# Patient Record
Sex: Female | Born: 1955
Health system: Southern US, Community
[De-identification: ages and names within clinical notes are randomized; demographics above are authoritative.]

## PROBLEM LIST (undated history)

## (undated) DIAGNOSIS — S83281A Other tear of lateral meniscus, current injury, right knee, initial encounter: Secondary | ICD-10-CM

## (undated) DIAGNOSIS — N200 Calculus of kidney: Secondary | ICD-10-CM

## (undated) DIAGNOSIS — F329 Major depressive disorder, single episode, unspecified: Secondary | ICD-10-CM

## (undated) DIAGNOSIS — F32A Depression, unspecified: Secondary | ICD-10-CM

## (undated) DIAGNOSIS — E079 Disorder of thyroid, unspecified: Secondary | ICD-10-CM

## (undated) DIAGNOSIS — Z720 Tobacco use: Secondary | ICD-10-CM

## (undated) DIAGNOSIS — E785 Hyperlipidemia, unspecified: Secondary | ICD-10-CM

## (undated) DIAGNOSIS — I10 Essential (primary) hypertension: Secondary | ICD-10-CM

## (undated) DIAGNOSIS — I2510 Atherosclerotic heart disease of native coronary artery without angina pectoris: Secondary | ICD-10-CM

## (undated) DIAGNOSIS — F419 Anxiety disorder, unspecified: Secondary | ICD-10-CM

## (undated) HISTORY — DX: Atherosclerotic heart disease of native coronary artery without angina pectoris: I25.10

## (undated) HISTORY — PX: APPENDECTOMY: SHX54

## (undated) HISTORY — DX: Disorder of thyroid, unspecified: E07.9

## (undated) HISTORY — DX: Tobacco use: Z72.0

## (undated) HISTORY — DX: Major depressive disorder, single episode, unspecified: F32.9

## (undated) HISTORY — DX: Hyperlipidemia, unspecified: E78.5

## (undated) HISTORY — PX: TIBIA FRACTURE SURGERY: SHX806

## (undated) HISTORY — DX: Anxiety disorder, unspecified: F41.9

## (undated) HISTORY — DX: Depression, unspecified: F32.A

## (undated) HISTORY — PX: HX HEART CATHETERIZATION: SHX148

## (undated) HISTORY — PX: LEG SURGERY: SHX1003

## (undated) HISTORY — PX: HX APPENDECTOMY: SHX54

## (undated) HISTORY — PX: CORONARY ARTERY ANGIOPLASTY: PR CATH30428

---

## 2009-01-02 NOTE — ED Provider Notes (Signed)
[facility] ED    HPI  -----------------------------------------HISTORY OF PRESENT ILLNESS----------------------------------------  01/02/2009, 3:12 PM   Cassandra Shah is a 53 y.o. female presenting to the ED for headache for 2 weeks. Last night headache worsened. Patient took Fioricet and Vicodin but vomited x2 times. Pain is around her neck and head, report it is similar to previous migraines.  Patient went to Urgent Care this morning, was directed to come to the ER if her headache got worse. Patient has a hx of hypertension. The patient denies any other complaints or bodily discomforts, and is otherwise in their normal state of health.          Review of Systems   Constitutional: Negative for fever and chills.   HENT: Negative for congestion and rhinorrhea.    Eyes: Positive for photophobia. Negative for visual disturbance.   Gastrointestinal: Positive for nausea and vomiting.   Skin: Negative for rash and wound.   Neurological: Positive for headaches. Negative for weakness, light-headedness and numbness.       Past Medical History   Diagnosis Date   . Hypertension    . Migraine    . Kidney Stones          No past surgical history on file.  No family history on file.  History   Social History   . Marital Status: Single     Spouse Name: N/A     Number of Children: N/A   . Years of Education: N/A   Occupational History   . Not on file.   Social History Main Topics   . Tobacco Use: Never   . Alcohol Use: Yes      occasiona   . Drug Use: No   . Sexually Active: Not on file   Other Topics Concern   . Not on file   Social History Narrative   . No narrative on file         Outpatient prescriptions marked as taking for the 01/02/09 encounter [facility] Encounter)   Medication Sig Dispense Refill   . AMLODIPINE BESYLATE (NORVASC PO) Take  by Mouth.       Marland Kitchen BUTALB/ACETAMINOPHEN/CAFFEINE (FIORICET PO) Take  by Mouth.       . hydrocodone-acetaminophen (VICODIN) 5-500 mg PO TABS Take 1 Tab by Mouth Every 6 Hours As Needed as needed  for pain or headache   30  0         Allergies   Allergen Reactions   . Aspirin hives           Vital Signs:  Patient Vital Signs in the past 72 hrs:   Temp Heart Rate Resp BP BP Mean SpO2 Weight   01/02/09 1330 96.8 F (36 C) 99  24  159/98 mmHg 118 MM HG 99 % 86.183 kg (190 lb)         Physical Exam  ----------------------------------------------------PHYSICAL EXAM----------------------------------------------------    CONSTITUTIONAL:  Well developed, well nourished.  Awake & alert.  No acute distress, non-toxic in appearance, not diaphoretic.  Afebrile.    EYES: Pupils are equal, round, and reactive.  Extra-ocular muscles are intact.  Sclera are anicteric.     HENT:   AT/NC. Moist mucosa.  Posterior pharynx is clear, no exudates. The left TM is unremarkable.  The right TM is unremarkable.      NECK:  Non-tender C-spine.  No lymphadenopathy.  Neck is supple without meningismus.    CARDIOVASCULAR: Regular rate and rhythm, no murmurs, rubs, or gallops.  Peripheral  pulses are 2+ and equal.     PULMONARY/CHEST:  Clear to auscultation bilaterally. No wheezing, rales or rhonchi.    ABDOMINAL: Soft and non-distended.  No tenderness. No rebound, guarding, or rigidity.  Overall benign exam.     MUSCULOSKELETAL: Back is non-tender.  Full ROM in all extremities.  No peripheral edema.    SKIN:  Normal color.  No petechiae or purpura.      NEUROLOGICAL: Alert, awake, and appropriate. Cranial nerves II-XII are intact.  Motor strength is equal and 5/5 in the upper and lower extremities bilaterally.  No facial asymmetry.  Normal speech.  No acute focal neurological deficits are appreciated.    -----------------------------------------------------DIAGNOSTICS------------------------------------------------------    LABORATORY:   No results found for this visit on 01/02/09.        ED Objective Order Summary:   Orders Placed This Encounter   Procedure   . Ekg 12 lead unit performed            -------------------------------------------ED COURSE -AND- MEDICAL DECISION MAKING--------------------------------------    Documentation Review:   The available past medical records from previous ED visits, hospital admissions, or clinic visits have been reviewed.  The available information has contributed to PMSHx and the context for this visit.     The nursing notes for this patient's current visit have been reviewed with the vital signs, PMSFSHx, and initial presenting complaint incorporated into this note.     ED Medications   Medication Dose Route Frequency Provider   . NS 0.9 % injection 10 mL  10 mL Intravenous PRN Coral Ceo, MD   . sodium chloride (NORMAL SALINE) 0.9% infusion   1,000 mL Intravenous Once Coral Ceo, MD   . HYDROmorPHONE (DILAUDID) injection 1 mg  1 mg Intravenous Once Coral Ceo, MD   . promethazine (PHENERGAN) injection 12.5 mg  12.5 mg Intravenous Once Coral Ceo, MD             Physician's Notes:   6:05 PM pt ok with plan.  Feels better.  No FND.  To f/u with pmd.    The plan for treatment has been reviewed at bedside.  Pt agrees with the plan as discussed and her questions are answered at this time; the plan is stated as understood.      FINAL DIAGNOSIS:  1. Migraine Headache (346.90D)          --------------------------------------------------DISPOSITION-------------------------------------------------    Destination: Home      Discharge Rx:   New Prescriptions    OXYCODONE-ACETAMINOPHEN (PERCOCET) 5-325 MG PO TABS    Take 1 Tab by Mouth Every 6 Hours As Needed for Pain. Do not exceed 4000 mg of acetaminophen per day.    PROMETHAZINE (PHENERGAN) 25 MG PO TABS    Take 0.5 Tabs by Mouth Every 6 Hours As Needed for Nausea.             Scribe Note:  Documentation by Sharion Balloon Copsey acting as scribe for Coral Ceo, MD.    Coral Ceo, MD: This documentation accurately reflects all work, treatment, procedures, and medical decision making performed by me.

## 2009-05-30 NOTE — Procedures (Signed)
Procedures signed by  at 06/24/09  2103                 Author: Dorna Leitz, MD  Service: --  Author Type: Physician            Filed:   Date of Service: 05/30/09 0905  Status: Signed          <!--EPICS-->                      [facility]<BR>                 [address] [place], [place]  [address]<BR> <BR>                           SLEEP MEDICINE EVALUATION<BR> <BR> PATIENT:    Cassandra Shah, Cassandra Shah<BR> MRN:            [id number]     ADMITTED:      05/29/2009<BR> BILLING:        [id number]    CONSULTED:     05/29/2009<BR> ATTENDING:  Randon Goldsmith Reia Viernes, MD<BR> CONSULTING:  Mahealani Sulak D Yarithza Mink, MD<BR> <BR> <BR> The patient is self referred. She thinks that she has obstructive sleep<BR> apnea. She has excessive day-time sleepiness and loud snoring at night. She<BR> has also had a significant number of morning headaches, mouth  breathing.<BR> She also has problems with her hands falling asleep at night. She tosses<BR> and turns. Her Epworth Sleepiness Score is 17/24. She usually sleeps on her<BR> right side and feels that her symptoms are worsening. She may have an iced<BR>  tea before noon, but does not have any caffeine. She does not nap during<BR> the day. She goes to bed around 8:30 and gets up around 5 a.m. She has<BR> difficulty falling asleep and frequently takes [place]. She may have to get<BR> up 4 times throughout  the night to urinate. She does have a lot of problems<BR> with jaw clenching.<BR> <BR> PAST MEDICAL HISTORY:  Significant for skin cancer, hypertension,<BR> hyperlipidemia. She also has had long-term problems with sleeping and is on<BR> Lunesta 2 mg a  night.<BR> <BR> PAST SURGICAL HISTORY:  Status post hysterectomy at age 36. Cholecystectomy<BR> and appendectomy.<BR> <BR> MEDICATIONS<BR> 1. Bystolic.<BR> 2. Fioricet.<BR> 3. Lunesta.<BR> 4. 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 She may have an<BR>  alcoholic beverage every few months, but does not smoke.<BR> <BR> REVIEW OF SYSTEMS:  Significant for positional hand numbness and tingling<BR> at night. Blurring of her vision, ringing in  the ears, shortness of breath<BR> and frequent urination.<BR> <BR> PHYSICAL EXAMINATION<BR> VITAL SIGNS:  Blood pressure is 160/103, pulse 80, respirations 20. She is<BR> 5 feet 3 inches and weighs 208.6 pounds.<BR> HEENT:  Face is symmetric. Speech is  normal. Station, gait and tone are<BR> normal. Nares are patent without masses. Neck is supple without<BR> lymphadenopathy, thyromegaly, jugular venous distention, or carotid bruits.<BR> Neck circumference 14-1/2 inches. Oropharynx, Mallampati 1. Tonsils  and<BR> uvula 1+. Tongue 2+. Dentition is orthognathic.<BR> EXTREMITIES:  Shows no clubbing, cyanosis or edema. Peripheral pulses are<BR> adequate.<BR> <BR> IMPRESSION:  What sounds like significant obstructive sleep apnea. If she<BR> were to do the MSLT,  she would have to go off the [place] as well as the<BR> Fioricet for at least 2 weeks. It is difficult for her to fall asleep and<BR> typically the multiple sleep latency test would be to  look for narcolepsy,<BR> which of course would be associated with  falling asleep too easily.<BR> Therefore I do not think narcolepsy is an issue here and multiple sleep<BR> latency test will not be done. Because she uses Lunesta routinely, I do not<BR> think driving restriction will apply the day after the sleep study.  I will<BR> see her back in follow up and keep you updated as to her progress.<BR> <BR> <BR> <BR> <BR> <BR> <BR> <BR> <BR> <BR> <BR>       Electronically Signed<BR>       Dorna Leitz, MD 06/24/2009 21:03<BR>                                        Koltin Wehmeyer  D Torre Pikus, MD<BR> <BR> ADR:WMX<BR> D: 05/29/2009  8:47 P T: 05/30/2009  9:05 A<BR> Job #:  [id number]  CScriptDoc #:  [id number]<BR> cc:    ELIZABETH A COOPER, MD<BR>        Keenen Roessner D Tonyetta Berko, MD<BR> <!--EPICE-->

## 2009-05-30 NOTE — Procedures (Signed)
[facility]   [address], [place], [place] [address]     SLEEP MEDICINE EVALUATION    PATIENT: Cassandra Shah, Cassandra Shah  MRN: [id number] ADMITTED: 05/29/2009  BILLING: [id number] CONSULTED: 05/29/2009  ATTENDING: Dorna Leitz, MD  CONSULTING: Dorna Leitz, MD      The patient is self referred. She thinks that she has obstructive sleep  apnea. She has excessive day-time sleepiness and loud snoring at night. She  has also had a significant number of morning headaches, mouth breathing.  She also has problems with her hands falling asleep at night. She tosses  and turns. Her Epworth Sleepiness Score is 17/24. She usually sleeps on her  right side and feels that her symptoms are worsening. She may have an iced  tea before noon, but does not have any caffeine. She does not nap during  the day. She goes to bed around 8:30 and gets up around 5 a.m. She has  difficulty falling asleep and frequently takes [place]. She may have to get  up 4 times throughout the night to urinate. She does have a lot of problems  with jaw clenching.    PAST MEDICAL HISTORY: Significant for skin cancer, hypertension,  hyperlipidemia. She also has had long-term problems with sleeping and is on  Lunesta 2 mg a night.    PAST SURGICAL HISTORY: Status post hysterectomy at age 89. Cholecystectomy  and appendectomy.    MEDICATIONS  1. Bystolic.  2. Fioricet.  3. Lunesta.  4. Over-the-counter sleeping pills at times.    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 She may have an  alcoholic beverage every few months, but does not smoke.    REVIEW OF SYSTEMS: Significant for positional hand numbness and tingling  at night. Blurring of her vision, ringing in the ears, shortness of breath  and frequent urination.    PHYSICAL EXAMINATION  VITAL SIGNS: Blood pressure is 160/103, pulse 80, respirations 20. She is  5 feet 3 inches and weighs 208.6 pounds.   HEENT: Face is symmetric. Speech is normal. Station, gait and tone are  normal. Nares are patent without masses. Neck is supple without  lymphadenopathy, thyromegaly, jugular venous distention, or carotid bruits.  Neck circumference 14-1/2 inches. Oropharynx, Mallampati 1. Tonsils and  uvula 1+. Tongue 2+. Dentition is orthognathic.  EXTREMITIES: Shows no clubbing, cyanosis or edema. Peripheral pulses are  adequate.    IMPRESSION: What sounds like significant obstructive sleep apnea. If she  were to do the MSLT, she would have to go off the [place] as well as the  Fioricet for at least 2 weeks. It is difficult for her to fall asleep and  typically the multiple sleep latency test would be to look for narcolepsy,  which of course would be associated with falling asleep too easily.  Therefore I do not think narcolepsy is an issue here and multiple sleep  latency test will not be done. Because she uses Lunesta routinely, I do not  think driving restriction will apply the day after the sleep study. I will  see her back in follow up and keep you updated as to her progress.                       Electronically Signed   Dorna Leitz, MD 06/24/2009 21:03   Dorna Leitz, MD    ADR:WMX  D: 05/29/2009 8:47 P T: 05/30/2009 9:05 A  Job #: [id number]  CScriptDoc #: [id number]  cc: Cherrie Distance, MD   Dorna Leitz, MD

## 2009-06-04 NOTE — Procedures (Unsigned)
[facility]   [address], [place], [place] [address]   SLEEP DISORDER CENTER   POLYSOMNOGRAM    PATIENT: Cassandra Shah, Cassandra Shah  MRN: [id number] DATE: 05/29/2009  BILLING: [id number] LOCATION:  INTERPRETING: Margarito Courser, MD  REFERRING: Dorna Leitz, MD      REFERRING PHYSICIAN: Pricilla Larsson, MD    STUDY NUMBER: [id number]    HISTORY: This polysomnogram was performed for evaluation of  hypersomnolence and snoring, using the American Academy of Sleep Medicine  PSG referential montage. This was a frontal, central, and occipital  monopolar EEG recording, and included EOG, EMG, EKG, nasal/oral airflow,  oxygen saturation, thoracic and abdominal effort, body position, and  snoring monitoring.    Medications taken the day of the study included:      1. Bystolic.  2. Fioricet.  3. Lunesta.    Sedation was taken for the study. The patient had 7 hours of sleep the  night before the study. Respiratory rate ranged 12 to 18 breaths per  minute. No supplemental oxygen was used. Two bathroom trips were taken.  Moderate snoring was noted about 80% of the night, especially in the supine  position.    SCORING: Lights out at 9:46 p.m., lights on at 5:15 a.m. Total recording  time was 449 minutes, total sleep time was 423.5 minutes, for a sleep  efficiency index of 94%. Sleep onset latency was 6 minutes, REM sleep  latency was 114.5 minutes. Wake after sleep onset was 38 minutes.    Sleep stages included awake, stages 1, 2, 3, and REM. Total time awake was  38 minutes. Total time in stage 1 was 36 minutes (9% of total sleep time).  Total time in stage 2 was 225 minutes (63% of total sleep time). Total time  in stage 3 was 90 minutes (21% of total sleep time). Total time in REM was  72.5 minutes (17% of total sleep time). The respiratory event count was 188  with an AHI of 27 overall. The mean duration was 20 seconds, with the  longest duration being 67 seconds. The RAM index was 77, with a supine   index being 29. Minimum oxygen saturation was 81%, with an average of 92%  and 20 minutes were spent below a saturation of 89%.    PERIODIC LEG MOVEMENTS WITH AROUSALS: Zero.    PERIODIC LEG MOVEMENTS: 1.    AVERAGE HEART RATE: 72 beats per minute.    IMPRESSION: Severe obstructive sleep apnea. Alpha intrusion into slow wave  sleep was noted, but no other EEG or EKG abnormalities were noted. No  unusual behaviors were observed during the study. A second-night study for  CPAP titration is recommended and this will be arranged.                       Preliminary   Margarito Courser, MD    MB:wmx  D: 06/03/2009 3:21 P T: 06/04/2009 1:48 A  Job #: [id number] CScriptDoc #: [id number]  cc: Margarito Courser, MD   Cherrie Distance, MD   Dorna Leitz, MD

## 2009-06-25 NOTE — Procedures (Signed)
[facility]   [address] [place], [place] [address]   SLEEP DISORDER CENTER   POLYSOMNOGRAM    PATIENT: Cassandra Shah, Cassandra Shah  MRN: [id number] DATE: 06/05/2009  BILLING: [id number] LOCATION:  INTERPRETING: Dorna Leitz, MD  REFERRING: Dorna Leitz, MD      Study Number is [id number]    This is a titration night.    FINDINGS: The initial sleep onset latency was 16.5 minutes. The patient  slept a total of 6.7 hours. The respiratory rate was 16 to 18 breaths per  minute. Blood pressure was 168/99 before the study and 127/87 after the  study. Mouth breathing was frequently noted. Supplemental oxygen was not  used. Sedation was self administered. The previous night, the patient had 4  hours of sleep. Sleep hygiene violations were not reported. Sleep was  interrupted by one bathroom trip. No alpha intrusion or EEG abnormalities  were seen.    Parasomnias were not observed. Medications included Fioricet and Bystolic.  Cardiac tracings showed normal sinus rhythm. Respiratory events resolved  with PAP titration. The RDI was 1. No snoring was noted. No significant  oxygen desaturation was present. The patient spent only one minute with a  saturation below 89%. Periodic limb movements were not significant. The PLM  index was 1.    At a pressure of 14 CWP, the patient did well, with an RDI of 0.9. This  included supine REM.    RECOMMENDATIONS: PAP will be initiated at a pressure of 15 CWP. The best  mask was the small Quattro mask. Results and instructions regarding  treatment have been sent by mail and I will see the patient in followup.                     Electronically Signed   Dorna Leitz, MD 06/25/2009 19:11   Dorna Leitz, MD    ADR:WMX  D: 06/24/2009 9:49 P T: 06/25/2009 4:44 A  Job #: [id number] CScriptDoc #: [id number]  cc: Cherrie Distance, MD   Dorna Leitz, MD

## 2009-06-25 NOTE — Procedures (Signed)
Procedures signed by  at 06/25/09  1911                 Author: Dorna Leitz, MD  Service: --  Author Type: Physician            Filed:   Date of Service: 06/25/09 0444  Status: Signed            Procedure Orders        1. POLYSOMNOGRAPHY 1 NIGHT [[id number]] ordered by  at 06/25/09 0444                         <!--EPICS-->                      [facility]<BR>                 [address] [place], [place]  [address]<BR>                             SLEEP DISORDER CENTER<BR>                                POLYSOMNOGRAM<BR> <BR> PATIENT:      Cassandra Shah, Cassandra Shah<BR> MRN:              [id number]    DATE:       06/05/2009<BR> BILLING:          [id number]   LOCATION:<BR> INTERPRETING:  Randon Goldsmith Sumner Boesch, MD<BR> REFERRING:    Randon Goldsmith Jawann Urbani, MD<BR> <BR> <BR> Study Number is [id number]<BR> <BR> This is a titration night.<BR> <BR> FINDINGS:  The initial sleep onset latency was 16.5 minutes. The patient<BR> slept a total of 6.7 hours. The respiratory  rate was 16 to 18 breaths per<BR> minute. Blood pressure was 168/99 before the study and 127/87 after the<BR> study. Mouth breathing was frequently noted. Supplemental oxygen was not<BR> used. Sedation was self administered. The previous night, the patient  had 4<BR> hours of sleep. Sleep hygiene violations were not reported. Sleep was<BR> interrupted by one bathroom trip. No alpha intrusion or EEG abnormalities<BR> were seen.<BR> <BR> Parasomnias were not observed. Medications included Fioricet and Bystolic.<BR>  Cardiac tracings showed normal sinus rhythm. Respiratory events resolved<BR> with PAP titration. The RDI was 1. No snoring was noted. No significant<BR> oxygen desaturation was present. The patient spent only one minute with a<BR> saturation below 89%.  Periodic limb movements were not significant. The PLM<BR> index was 1.<BR> <BR> At a pressure of 14 CWP, the patient did well, with an RDI of 0.9. This<BR> included supine REM.<BR> <BR>  RECOMMENDATIONS:  PAP will be initiated at a pressure of 15 CWP.  The best<BR> mask was the small Quattro mask. Results and instructions regarding<BR> treatment have been sent by mail and I will see the patient in followup.<BR> <BR> <BR> <BR> <BR> <BR> <BR> <BR> <BR> <BR>  Electronically Signed<BR>  Dorna Leitz,  MD 06/25/2009 19:11<BR>                                 Chasmine Lender D Adyn Hoes, MD<BR> <BR> ADR:WMX<BR> D: 06/24/2009  9:49 P  T: 06/25/2009  4:44 A<BR> Job #:  [id number]  CScriptDoc #:  [id number]<BR> cc:   Cherrie Distance, MD<BR>  Ludy Messamore D Rylynn Kobs, MD<BR>  <!--EPICE-->

## 2009-07-27 NOTE — Procedures (Signed)
[facility]   [address] [place], [place] [address]     SLEEP MEDICINE FOLLOW-UP    PATIENT: Naidelin, Gugliotta  MRN: [id number] ADMITTED: 07/24/2009  BILLING [id number] CONSULTED:  REFERRING: Dorna Leitz, MD  CONSULTING: Dorna Leitz, MD      : She returns today for followup with her CPAP. Her compliance data shows  that she is using it most every day but has been pulling it off. Her  compliance percentage is 28.6%. She has been using it since July 09, 2009,  and actually improving with her time as time goes on. She is using Lunesta  to help with her sleepiness. She is not having any postnasal drip,  stuffiness. She is not having any nasal skin irritation, sore throat, mouth  dryness, TMJ pain, palpitations, cough, or abdominal pain. Her fatigue is  improved.    PAST MEDICAL HISTORY: Significant for cancer. She has hypertension,  hyperlipidemia, obesity, status post appendectomy, cholecystectomy,  hysterectomy.    SOCIAL HISTORY: No tobacco use. She drinks in moderation, lives  independently. 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    DRUG ALLERGIES: NONE KNOWN.    FAMILY HISTORY: Cardiac disease, hypertension, diabetes and stroke.    REVIEW OF SYSTEMS: Some vision problems, tinnitus, shortness of breath,  increased urinary frequency, tingling and numbness.    PHYSICAL EXAMINATION: Blood pressure is 105/72, heart rate 78,  respirations 20. Weight is 214 pounds. She is 5 feet 2 inches.    IMPRESSION: Obstructive sleep apnea.    RECOMMENDATIONS: Continue CPAP using an small Quattro mask. [employer] is her  home care company. She uses the S9 machine. Her current pressure is 14 CWP.  I will see her back in followup in about 2 months.                       Electronically Signed   Dorna Leitz, MD 08/10/2009 09:07   Dorna Leitz, MD        ADR:WMX  D: 07/27/2009 11:17 AT: 07/27/2009 12:11 P  Job #: [id number] CScriptDoc #: [id number]  cc: Dorna Leitz, MD

## 2009-07-27 NOTE — Procedures (Signed)
Procedures signed by  at 08/10/09  1610                 Author: Dorna Leitz, MD  Service: --  Author Type: Physician            Filed:   Date of Service: 07/27/09 1211  Status: Signed          <!--EPICS-->                      [facility]<BR>                 [address] [place], [place]  [address]<BR> <BR>                           SLEEP MEDICINE FOLLOW-UP<BR> <BR> PATIENT:     Cassandra, Simonet Shah<BR> MRN:             [id number]    ADMITTED:      07/24/2009<BR> BILLING          [id number]   CONSULTED:<BR> REFERRING:   Randon Goldsmith Reinhardt Licausi, MD<BR> CONSULTING:  Denice Cardon  D Yadriel Kerrigan, MD<BR> <BR> <BR> :  She returns today for followup with her CPAP. Her compliance data shows<BR> that she is using it most every day but has been pulling it off. Her<BR> compliance percentage is 28.6%. She has been using it since July 09, 2009,<BR>  and actually improving with her time as time goes on. She is using Lunesta<BR> to help with her sleepiness. She is not having any postnasal drip,<BR> stuffiness. She is not having any nasal skin irritation, sore throat, mouth<BR> dryness, TMJ pain, palpitations,  cough, or abdominal pain. Her fatigue is<BR> improved.<BR> <BR> PAST MEDICAL HISTORY:  Significant for cancer. She has hypertension,<BR> hyperlipidemia, obesity, status post appendectomy, cholecystectomy,<BR> hysterectomy.<BR> <BR> SOCIAL HISTORY:  No  tobacco use. She drinks in moderation, lives<BR> independently. 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 Weight is 214 pounds. She is 5 feet 2 inches.<BR>  <BR> IMPRESSION:  Obstructive sleep apnea.<BR> <BR>  RECOMMENDATIONS:  Continue CPAP using an small Quattro mask. [employer] is her<BR> home care company. She uses the S9 machine. Her current pressure is 14 CWP.<BR> I will see her back in followup in about 2  months.<BR> <BR> <BR> <BR> <BR> <BR> <BR> <BR> <BR> <BR> <BR>      Electronically Signed<BR>      Dorna Leitz, MD 08/10/2009 09:07<BR>                                    Magalene Mclear D Brooklynne Pereida, MD<BR> <BR> <BR> <BR> ADR:WMX<BR> D: 07/27/2009 11:17 AT: 07/27/2009  12:11 P<BR> Job #:  [id number]  CScriptDoc #:  [id number]<BR> cc:   Randon Goldsmith Mareta Chesnut, MD<BR> <!--EPICE-->

## 2009-10-13 ENCOUNTER — Emergency Department (HOSPITAL_COMMUNITY): Payer: Self-pay | Admitting: Emergency Medicine

## 2009-11-26 LAB — CBC WITH AUTOMATED DIFF
ABS. BASOPHILS: 0 10*3/uL (ref 0.0–0.06)
ABS. EOSINOPHILS: 0.2 10*3/uL (ref 0.0–0.4)
ABS. LYMPHOCYTES: 1.9 10*3/uL (ref 0.9–3.6)
ABS. MONOCYTES: 0.6 10*3/uL (ref 0.05–1.2)
ABS. NEUTROPHILS: 2.8 10*3/uL (ref 1.8–8.0)
BASOPHILS: 1 % (ref 0–2)
EOSINOPHILS: 3 % (ref 0–5)
HCT: 44 % (ref 35.0–45.0)
HGB: 13.8 g/dL (ref 12.0–16.0)
LYMPHOCYTES: 35 % (ref 21–52)
MCH: 29 PG (ref 24.0–34.0)
MCHC: 31.4 g/dL (ref 31.0–37.0)
MCV: 92.4 FL (ref 74.0–97.0)
MONOCYTES: 10 % (ref 3–10)
MPV: 11.9 FL — ABNORMAL HIGH (ref 9.2–11.8)
NEUTROPHILS: 51 % (ref 40–73)
PLATELET: 220 10*3/uL (ref 135–420)
RBC: 4.76 M/uL (ref 4.20–5.30)
RDW: 13.3 % (ref 11.6–14.5)
WBC: 5.5 10*3/uL (ref 4.6–13.2)

## 2009-11-26 LAB — METABOLIC PANEL, COMPREHENSIVE
A-G Ratio: 1 (ref 0.8–1.7)
ALT (SGPT): 42 U/L (ref 30–65)
AST (SGOT): 23 U/L (ref 15–37)
Albumin: 3.8 g/dL (ref 3.4–5.0)
Alk. phosphatase: 112 U/L (ref 50–136)
Anion gap: 10 mmol/L (ref 5–15)
BUN/Creatinine ratio: 28 — ABNORMAL HIGH (ref 12–20)
BUN: 25 MG/DL — ABNORMAL HIGH (ref 7–18)
Bilirubin, total: 0.3 MG/DL (ref 0.2–1.0)
CO2: 29 MMOL/L (ref 21–32)
Calcium: 9.1 MG/DL (ref 8.4–10.4)
Chloride: 103 MMOL/L (ref 100–108)
Creatinine: 0.9 MG/DL (ref 0.6–1.3)
GFR est AA: >60 mL/min/{1.73_m2} (ref >60)
GFR est non-AA: >60 mL/min/{1.73_m2} (ref >60)
Globulin: 3.9 g/dL (ref 2.0–4.0)
Glucose: 74 MG/DL (ref 74–99)
Potassium: 3.6 MMOL/L (ref 3.5–5.5)
Protein, total: 7.7 g/dL (ref 6.4–8.2)
Sodium: 142 MMOL/L (ref 136–145)

## 2009-11-26 LAB — TYPE & SCREEN
ABO/Rh(D): AB POS
Antibody screen: NEGATIVE

## 2009-11-26 LAB — TYPE AND SCREEN
ABO/Rh: AB POS
Antibody Screen: NEGATIVE

## 2009-12-03 NOTE — H&P (Signed)
[facility]   [address]   [place] [address]     PRE-OPERATIVE HISTORY AND PHYSICAL    PATIENT : Cassandra Shah, Cassandra Shah  MRN: [id number] DATE: 12/04/2009  BILLING: [id number] LOCATION:  DICTATING: Angelique Holm, MD        CHIEF COMPLAINT: Requests gastric sleeve resection.    HISTORY OF PRESENT ILLNESS: The patient is a 54 year old female. According  to her, she has had a long-standing history of obesity for many years. She  states that for the past 25 years she has struggled with her weight, trying  all types conservative weight loss measures and over-the-counter  medications, in addition to seeing a dietitian. With all this, she has  failed over that time period. She began to consider weight loss surgical  options some time ago with the development of health issues. She did attend  my information seminar on weight loss surgery, ultimately choosing the  sleeve gastrectomy as a procedure choice for weight control. She has  undergone preoperative nutritional and psychological teaching. At this time  period, she does wish to proceed with sleeve gastrectomy.    PAST MEDICAL HISTORY  1. Hypertension.  2. Hypercholesterolemia.  3. Gastroesophageal reflux disease.  4. Stress urinary incontinence.  5. Obstructive sleep apnea.  6. Migraine headaches.    PAST SURGICAL HISTORY: She had a total abdominal hysterectomy in the past.  She had a cholecystectomy and appendectomy in the past. She had a breast  reduction in the past, abdominoplasty in the past, and a laparotomy due to  endometriosis in the past.    SOCIAL HISTORY: The patient is a nonsmoker. She is a very rare drinker.    MEDICATIONS  1. Bystolic 10 mg daily.  2. Hydrochlorothiazide 100 mg daily.  3. Fioricet p.r.n.  4. Xanax p.r.n.  5. Lunesta 0.2 mg daily.  6. She has a CPAP machine.    FAMILY HISTORY: Mom and dad are both deceased: Mom from myocardial   infarction, COPD and diabetes; dad from a motor vehicle accident in the  past.    REVIEW OF SYSTEMS  GENERAL: She is in fair health, not particularly active overall.  HEAD AND NECK: No complaints. There is positive dizziness.  CARDIOVASCULAR: No complaints of any chest pain. No shortness of breath.  LUNGS: No complaints of any shortness of breath. No sputum or productive  cough.  GASTROINTESTINAL: No complaints of any constipation, obstipation, or  diarrhea. No blood per rectum or emesis noted in the past.  GENITOURINARY: No complaints of hematuria or dysuria.  MUSCULOSKELETAL: No complaints of any bone or back pain.  HEMATOLOGIC: No history of bleeding disorder in the past.  NEUROLOGIC: No history of seizures in the past.    PHYSICAL EXAMINATION  VITAL SIGNS: Blood pressure is 124/82, pulse 84, respirations 18,  temperature is 98.2. Height is 5 feet 2 inches. Weight is 218 pounds. Body  mass index 40. Ideal body weight is 125 pounds.  GENERAL: She appears to be otherwise well, awake, alert and oriented x4.  HEENT: Sclerae are anicteric. Extraocular movements intact. Pupils equal  and reactive to light. Oropharynx clear.  NECK: Supple. No thyromegaly. No adenopathy. No carotid bruits noted.  CARDIOVASCULAR: Regular rate and rhythm. No murmurs, rubs, gallops noted.  LUNGS: Clear bilaterally. No wheezing, rhonchi, or rales present.  ABDOMEN: Bowel sounds soft, nondistended, nontender. Normoactive bowel  sounds noted. No organomegaly is appreciated. She does have a long lower  midline incision which extends just  above her umbilicus.  EXTREMITIES: Upper extremities normal. Good palpable pulses noted. Lower  extremities likewise normal. Good palpable pedal pulses noted.  NEUROLOGIC: Cranial nerves intact. Motor function 5/5 throughout. Sensory  exam is normal to light touch.    LABORATORY DATA: The patient does have a CBC and comprehensive metabolic  profile which were within normal limits. Chest x-ray shows no acute   cardiopulmonary process.    ASSESSMENT  1. A 54 year old female with a history of morbid obesity. Currently height  is 5 feet 2 inches, weight is 218 pounds, which places her body mass index  at 40, ideal body weight of 125 pounds. At this time period, the patient  appears to be a good candidate for the gastric sleeve resection. She has a  clear understanding of this operation and compared to the other weight loss  surgical options, she has chosen the sleeve gastrectomy as the procedure of  choice for weight control. She does understand the risks of any weight loss  surgery which include but are not limited to death, deep venous thrombosis,  pulmonary emboli, pouch leak, ulceration, among other things. She  understands the expected weight loss with the gastric sleeve resection,  understands the need for strict adherence to dietary regimen and exercise  protocol to help her achieve her goal weight loss ultimately.  2. Patient has history of hypertension, currently on 2-drug therapy. Those  medications will be continued in the perioperative period and postoperative  period. We will stop her hydrochlorothiazide postoperatively, and not  resume this medication to prevent any type of dehydration.  3. History of hypercholesterolemia, currently diet controlled. We will have  her see her family physician 3 months postoperatively to have her  cholesterol panel repeated at that juncture.  4. History of stress urinary incontinence. This should improve with weight  loss surgery.  5. History of gastroesophageal reflux disease. Intraoperatively, we will  assess her for a hiatal hernia. If one is present, I have explained to her  that we will go ahead and repair that as the sleeve gastrectomy does not  have mechanism for cure of heartburn spontaneously as the gastric bypass  does.  6. History of obstructive sleep apnea. We will have her use her continuous  positive airway pressure machine. Will have her repeat her sleep study 6   months postoperatively to see if this can be removed.    We will then proceed as outlined above.                       Electronically Signed   Angelique Holm, MD 02/06/2010 18:15   Angelique Holm, MD        AT:WMX  D: 12/03/2009 8:39 A T: 12/03/2009 9:00 A  BY: [id number]  Job [id number] CScriptDoc #: [id number]    cc: Angelique Holm, MD

## 2009-12-06 NOTE — Op Note (Signed)
[facility]   [address]   [facility] [place]  [address]   OPERATIVE REPORT    PATIENT: Cassandra, Shah  MRN: [id number] DATE: 12/04/2009  BILLING: [id number] LOCATION: 3B 0317A  DICTATING: Angelique Holm, MD        PREOPERATIVE DIAGNOSIS: Morbid obesity.    POSTOPERATIVE DIAGNOSES  1. Morbid obesity.  2. Extensive intraabdominal adhesions.  3. Fatty infiltration of liver.    PROCEDURES PERFORMED  1. Laparoscopic lysis of adhesions.  2. Laparoscopic sleeve gastrectomy.  3. Liver biopsy.    SURGEON: Angelique Holm, MD    ASSISTANT: Nadara Mustard, PA-C    ANESTHESIA: General endotracheal.    COMPLICATIONS: None.    ESTIMATED BLOOD LOSS: Less than 20 mL.    FINDINGS: The patient was noted to have massive intraabdominal adhesions  due to her prior laparotomy. She, in addition, was noted to have a  moderately fatty liver.    SPECIMENS: Liver biopsy specimen, sleeve gastrectomy specimen.    STATEMENT OF MEDICAL NECESSITY: The patient is a 54 year old female who  has had a longstanding history of obesity. She failed at conservative loss  measures, as such began to consider weight loss surgical options. She  ultimately chose the sleeve gastrectomy as a means of surgical weight  control. She has undergone preoperative nutritional and psychological  teaching. At this time, does wish to proceed with sleeve gastrectomy.    OPERATIVE PROCEDURE: The patient was brought to the operating room, placed  in the supine position. General anesthesia was administered without any  difficulty. Her abdomen was then prepped and draped in the usual sterile  fashion.    Of note is the fact the patient did have a long laparotomy incision from  prior hysterectomy for endometriosis. The incision extended well above her  umbilicus, almost to her xiphoid process. I chose to place the incision  along the left upper quadrant region. I used the Veress needle and Visiport   to enter the abdomen. On inspection of the abdominal wall, indeed her  adhesions extended all the way up to the falciform ligament. I placed the  remainder of the trocars in the left upper quadrant region. I then began  extensive lysis of adhesions, beginning at this area at the liver margin  extending all the way down such that I could clear the periumbilical  region. I then placed the remainder of the trocars and then turned my  attention to the operation itself. Total lysis of adhesions time was  approximately 30 minutes.    I then at this juncture turned my attention to the gastric wall. I chose an  area 5 cm proximal to the pylorus, and within the gastroepiploic vessel  began to divide off the greater curvature vessels using the harmonic  scalpel. I moved cephalad towards the short gastric vessels, which I then  took down individually, once again using the harmonic scalpel. This took a  moderate amount of time due to the very close proximity of the gastric  fundus to the splenic hilum. Once the entire stomach was mobilized, I  proceeded with the resection itself.    I removed the orogastric tube which I had used to decompress the stomach. I  then replaced it with a 34-French bougie and impacted the bougie at the  distal antrum. I then chose an area 5 cm proximal to the pylorus and using  the Echelon stapler with green reloads I took the first tangential bite  along the  bougie, angling towards the incisura region. I then, with a 2nd  firing of the stapling device, reached just past the incisura. Then the  remainder of the 5 firings of the stapling device with gold reloads were  used to finish the transection at the level of the left crural region. The  bougie was then removed. It was replaced it with an orogastric tube, and  the pouch was tested using dilute methylene blue. It was noted to be  completely watertight.    I then turned my attention at this juncture to the staple line. I checked   it for hemostasis and noted it to be completely intact. I then placed  Surgicel along the staple line itself. I then removed the liver retractor.  I did biopsy the left lobe liver due its enlargement, and I submitted it to  Pathology as permanent section.    I then, at this juncture, removed all trocars, closing the left lower  quadrant trocar site using a transabdominal 0 PDS suture after removing the  gastric specimen from the operative field. I then closed all skin incisions  using 4-0 surgical Monocryl. Steri-Strips and sterile dressings were  applied.    The patient tolerated the procedure well.                         Electronically Signed   Angelique Holm, MD 02/06/2010 18:15   Angelique Holm, MD        AT:WMX  D: 12/05/2009 7:32 P T: 12/06/2009 5:28 A  BY: [id number]  Job#: [id number] CScriptDoc #: [id number]    cc: Angelique Holm, MD

## 2009-12-06 NOTE — Op Note (Signed)
Op Notes signed by  at 02/06/10 1815                  Author: Crissie Reese, MD  Service: --  Author Type: Physician       Filed: 02/06/10 1815  Date of Service: 12/06/09 0528  Status: Signed          Editor: Crissie Reese, MD (Physician)          <!--EPICS-->                    [facility]<BR>                             [address]<BR>                          [place] [address]<BR>                              OPERATIVE REPORT<BR> <BR> PATIENT:       Cassandra Shah, Cassandra Shah<BR> MRN:              [id number]  DATE:             12/04/2009<BR> BILLING:          [id number] LOCATION:         3B  0317A<BR>  DICTATING:     Angelique Holm, MD<BR> <BR> <BR> <BR> PREOPERATIVE DIAGNOSIS:  Morbid obesity.<BR> <BR> POSTOPERATIVE DIAGNOSES<BR> 1. Morbid obesity.<BR> 2. Extensive intraabdominal adhesions.<BR> 3. Fatty infiltration of liver.<BR> <BR> PROCEDURES  PERFORMED<BR> 1. Laparoscopic lysis of adhesions.<BR> 2. Laparoscopic sleeve gastrectomy.<BR> 3. Liver biopsy.<BR> <BR> SURGEON:  Angelique Holm, MD<BR> <BR> ASSISTANT:  Nadara Mustard, PA-C<BR> <BR> ANESTHESIA:  General endotracheal.<BR> <BR> COMPLICATIONS:   None.<BR> <BR> ESTIMATED BLOOD LOSS:  Less than 20 mL.<BR> <BR> FINDINGS:  The patient was noted to have massive intraabdominal adhesions<BR> due to her prior laparotomy. She, in addition, was noted to have a<BR> moderately fatty liver.<BR> <BR> SPECIMENS:   Liver biopsy specimen, sleeve gastrectomy specimen.<BR> <BR> STATEMENT OF MEDICAL NECESSITY:  The patient is a 54 year old female who<BR> has had a longstanding history of obesity. She failed at conservative loss<BR> measures, as such began to consider  weight loss surgical options. She<BR> ultimately chose the sleeve gastrectomy as a means of surgical weight<BR> control. She has undergone preoperative nutritional and psychological<BR> teaching. At this time, does wish to proceed with sleeve  gastrectomy.<BR>  <BR> OPERATIVE PROCEDURE:  The patient was brought to the operating room, placed<BR> in the supine position. General anesthesia was administered without any<BR> difficulty. Her abdomen was then prepped and draped in the usual sterile<BR> fashion.<BR>  <BR> Of note is the fact the patient did have a long laparotomy incision from<BR> prior hysterectomy for endometriosis. The incision extended well above her<BR> umbilicus, almost to her xiphoid process. I chose to place the incision<BR> along the left  upper quadrant region. I used the Veress needle and Visiport<BR> to enter the abdomen. On inspection of the abdominal wall, indeed her<BR> adhesions extended all the way up to the falciform ligament. I placed the<BR> remainder of the trocars in the left  upper quadrant region. I then began<BR> extensive lysis of adhesions, beginning at this area at the liver margin<BR> extending all the way down such that I could clear the periumbilical<BR> region. I then placed  the remainder of the trocars and then turned  my<BR> attention to the operation itself. Total lysis of adhesions time was<BR> approximately 30 minutes.<BR> <BR> I then at this juncture turned my attention to the gastric wall. I chose an<BR> area 5 cm proximal to the pylorus, and within the gastroepiploic  vessel<BR> began to divide off the greater curvature vessels using the harmonic<BR> scalpel. I moved cephalad towards the short gastric vessels, which I then<BR> took down individually, once again using the harmonic scalpel. This took a<BR> moderate amount  of time due to the very close proximity of the gastric<BR> fundus to the splenic hilum. Once the entire stomach was mobilized, I<BR> proceeded with the resection itself.<BR> <BR> I removed the orogastric tube which I had used to decompress the stomach.  I<BR> then replaced it with a 34-French bougie and impacted the bougie at the<BR> distal antrum. I then chose an area 5 cm proximal to the  pylorus and using<BR> the Echelon stapler with green reloads I took the first tangential bite<BR> along the bougie,  angling towards the incisura region. I then, with a 2nd<BR> firing of the stapling device, reached just past the incisura. Then the<BR> remainder of the 5 firings of the stapling device with gold reloads were<BR> used to finish the transection at the  level of the left crural region. The<BR> bougie was then removed. It was replaced it with an orogastric tube, and<BR> the pouch was tested using dilute methylene blue. It was noted to be<BR> completely watertight.<BR> <BR> I then turned my attention at  this juncture to the staple line. I checked<BR> it for hemostasis and noted it to be completely intact. I then placed<BR> Surgicel along the staple line itself. I then removed the liver retractor.<BR> I did biopsy the left lobe liver due its enlargement,  and I submitted it to<BR> Pathology as permanent section.<BR> <BR> I then, at this juncture, removed all trocars, closing the left lower<BR> quadrant trocar site using a transabdominal 0 PDS suture after removing the<BR> gastric specimen from the operative  field. I then closed all skin incisions<BR> using 4-0 surgical Monocryl. Steri-Strips and sterile dressings were<BR> applied.<BR> <BR> The patient tolerated the procedure well.<BR> <BR> <BR> <BR> <BR> <BR> <BR> <BR> <BR> <BR> <BR> <BR>    Electronically  Signed<BR>    Angelique Holm, MD 02/06/2010 18:15<BR>                                  Angelique Holm, MD<BR> <BR> <BR> <BR> AT:WMX<BR> D: 12/05/2009  7:32 P   T: 12/06/2009  5:28 A<BR> BY:  [id number]<BR> Job#:  [id number]  CScriptDoc #:  [id number]<BR>  <BR> cc:   Angelique Holm, MD<BR> <!--EPICE-->

## 2009-12-19 NOTE — Progress Notes (Signed)
Subjective:     Cassandra Shah  is a 54 y.o. female who presents for follow-up about 2 weeks following laparoscopic sleeve gastrectomy. She has lost a total of 19 pounds since surgery.  Body mass index is 36.40 kg/(m^2)..    Surgery related complication: NA    Surgical Pathology/Bx have been discussed       She reports no real symptoms and denies vomiting, abdominal pain and diarrhea.      The patient's exercise level: will begin cardio.    Changes in her medical history and medications have been reviewed.    Patient Active Problem List   Diagnoses Code   ??? Morbid obesity 278.01   ??? Hypertension 401.9AJ   ??? Sleep apnea 780.57C   ??? Migraine 346.90A       Past Medical History   Diagnosis Date   ??? Morbid obesity 11/16/2009   ??? Hypertension 11/16/2009   ??? Sleep apnea 11/16/2009   ??? GERD (gastroesophageal reflux disease)    ??? SUI (stress urinary incontinence)    ??? Migraines    ??? Hypercholesterolemia        Past Surgical History   Procedure Date   ??? Hx hysterectomy 1983     complete   ??? Hx breast reduction 02/2003   ??? Abdomen surgery proc unlisted 2000     gall bladder   ??? Abdomen surgery proc unlisted 02/2003     tummy tuck   ??? Hx appendectomy 2002     appendix attack         Objective:     BP 120/78   Pulse 78   Resp 16   Ht 5\' 2"  (1.575 m)   Wt 199 lb (90.266 kg)   BMI 36.40 kg/m2     Physical Exam:    General:  alert, cooperative, no distress, appears stated age   Pulm: clear to auscultation bilaterally   Heart:  Regular rate and rhythm   Abdomen:   abdomen is soft without significant tenderness, masses, organomegaly or guarding;    Incisions: healing well, no significant drainage       Assessment:     1. History of Morbid obesity, status post  laparoscopic sleeve gastrectomy. Doing well; no concerns. WIll F/U with PCP next couple months to discuss changes in medications.     Plan:     1. Remember to measure portions, continue low carbohydrate diet  2. Advance diet to puree phase  3. Remember vitamin supplements.   4. Start cardio exercise.  5. Attend support group  6. Follow-up in 2 week(s).

## 2010-01-02 NOTE — Progress Notes (Signed)
Subjective:     Cassandra Shah  is a 54 y.o. female who presents for follow-up about 1 month following laparoscopic sleeve gastrectomy. She has lost a total of 24 pounds (26% of EBW) since surgery.  Body mass index is 35.48 kg/(m^2)..    Surgery related complication: NA       She reports no real issues and denies vomiting, abdominal pain and diarrhea.      The patient's exercise level: moderately active.    Changes in her medical history and medications have been reviewed.    Patient Active Problem List   Diagnoses Code   ??? Morbid obesity 278.01   ??? Hypertension 401.9AJ   ??? Sleep apnea 780.57C   ??? Migraine 346.90A       Past Medical History   Diagnosis Date   ??? Morbid obesity 11/16/2009   ??? Hypertension 11/16/2009   ??? Sleep apnea 11/16/2009   ??? GERD (gastroesophageal reflux disease)    ??? SUI (stress urinary incontinence)    ??? Migraines    ??? Hypercholesterolemia        Past Surgical History   Procedure Date   ??? Hx hysterectomy 1983     complete   ??? Hx breast reduction 02/2003   ??? Abdomen surgery proc unlisted 2000     gall bladder   ??? Abdomen surgery proc unlisted 02/2003     tummy tuck   ??? Hx appendectomy 2002     appendix attack         Objective:     BP 110/68   Pulse 76   Resp 16   Ht 5\' 2"  (1.575 m)   Wt 194 lb (87.998 kg)   BMI 35.48 kg/m2     Physical Exam:    General:  alert, cooperative, no distress, appears stated age   Pulm: NA   Heart:  NA   Abdomen:   abdomen is soft without significant tenderness, masses, organomegaly or guarding;    Incisions: healing well, no significant drainage       Assessment:     1. History of Morbid obesity, status post  laparoscopic sleeve gastrectomy. Doing well; no concerns.  Will start [employer].  Pt has ultimate goal wt of 135 lbs    Plan:     1. Remember to measure portions, continue low carbohydrate diet  2. Advance diet to solid phase  3. Remember vitamin supplements.  4. Add weight resistance to exercise.  5. Attend support group  6. Follow-up in 1 month(s).

## 2010-01-02 NOTE — Patient Instructions (Signed)
MyChart Activation    Thank you for requesting access to MyChart. Please follow the instructions below to securely access and download your online medical record. MyChart allows you to send messages to your doctor, view your test results, renew your prescriptions, schedule appointments, and more.    How Do I Sign Up?    1. In your internet browser, go to [email or web address].  2. Click on the First Time User? Click Here link in the Sign In box. You will see the New Member Sign Up page.  3. Enter your MyChart Access Code exactly as it appears below. You will not need to use this code after you???ve completed the sign-up process. If you do not sign up before the expiration date, you must request a new code.    MyChart Access Code: [email or web address]  Expires: 04/02/10 11:06 AM     4. Enter the last four digits of your Social Security Number (xxxx) and Date of Birth (mm/dd/yyyy) as indicated and click Submit. You will be taken to the next sign-up page.  5. Create a MyChart ID. This will be your MyChart login ID and cannot be changed, so think of one that is secure and easy to remember.  6. Create a MyChart password. You can change your password at any time.  7. Enter your Password Reset Question and Answer. This can be used at a later time if you forget your password.   8. Enter your e-mail address. You will receive e-mail notification when new information is available in MyChart.  9. Click Sign Up. You can now view and download portions of your medical record.  10. Click the Download Summary menu link to download a portable copy of your medical information.    Additional Information    If you have questions, please call [phone]. Remember, MyChart is NOT to be used for urgent needs. For medical emergencies, dial 911.

## 2010-01-29 NOTE — Progress Notes (Addendum)
Subjective:   Cassandra Shah  is a 54 y.o. female who presents for follow-up about 2 months following laparoscopic sleeve gastrectomy. She has lost a total of 34 pounds since surgery.  Body mass index is 33.65 kg/(m^2).Marland Kitchen  The patient has lost 36% of EBW.    Surgery related complication: NA     She denies vomiting and abdominal pain.    Fluid intake:   good            Protein intake:  good  Tolerating diet well, but only eating 3 meals daily    The patient's exercise level: moderately active.    Type of exercise: walking    Changes in her medical history and medications have been reviewed.    Patient Active Problem List   Diagnoses Code   ??? Morbid obesity 278.01   ??? Hypertension 401.9AJ   ??? Sleep apnea 780.57C   ??? Migraine 346.90A       Past Medical History   Diagnosis Date   ??? Morbid obesity 11/16/2009   ??? Hypertension 11/16/2009   ??? Sleep apnea 11/16/2009   ??? GERD (gastroesophageal reflux disease)    ??? SUI (stress urinary incontinence)    ??? Migraines    ??? Hypercholesterolemia        Past Surgical History   Procedure Date   ??? Hx hysterectomy 1983     complete   ??? Hx breast reduction 02/2003   ??? Abdomen surgery proc unlisted 2000     gall bladder   ??? Abdomen surgery proc unlisted 02/2003     tummy tuck   ??? Hx appendectomy 2002     appendix attack         Objective:     BP 124/86   Pulse 72   Resp 18   Ht 5\' 2"  (1.575 m)   Wt 184 lb (83.462 kg)   BMI 33.65 kg/m2     Physical Exam:    General:  alert, cooperative, no distress, appears stated age   Lungs:   clear to auscultation bilaterally   Heart:  Regular rate and rhythm   Abdomen:   abdomen is soft without significant tenderness, masses, organomegaly or guarding;    Incisions: healed         Assessment:     1. History of Morbid obesity, status post  laparoscopic sleeve gastrectomy. Doing well; no concerns..     Plan:     1. Reminded to measure portions, continue high protein, low carbohydrate diet.   2. Reminded to eat regularly.   3. Reminded of importance of vitamin supplements.  4. To continue increase activity - adding resistance.  5. Encouraged attendance @ support group  6. To follow-up in 1 month(s) with dietician & 2 months with Dr. Shanon Brow.               I have reviewed the treatment course and agree with the current plan of action.    Angelique Holm M.D.

## 2010-02-26 NOTE — Progress Notes (Signed)
Pt s/p laparoscopic sleeve gastrectomy 3 months. She has already met her 3 month wt loss goal. Overall she is doing very well, having changed her diet to eating every 3 to 4 hours.     Nutritional Hx:  What is the number of meals you eat per day? 3  Comment: BLD    Do you eat between meals / snack? Yes, 3  Typical snack: beef jerky, sugar free fudgesicle    How fast do you eat your meals? slow    How many servings of vegetables and fruits per day do you consume?   Vegetables: 1   Fruit: 0  How many sodas do you drink per day? none    How many caffeinated drinks do you have per day? Occasional tea    How much water do you drink per day? 6 (8oz glasses), at least    How often do you eat fast food? occasionally, grilled chicken     How often do you consume alcohol? never;    What do you feel is your weakest area? Nothing right now    Diet History:  Breakfast  What are you eating and how much? Low sugar yogurt Dannon Light and Fit carb control whole yogurt, (1/4 scoop unjury)    When? 7 am   Where? work   Snacks  What are you eating and how much? Piece of beef jerkey (1oz)    When? 9am   Where? work   Hydration  What are you eating and how much? Water    When? ii   Where? ii   Lunch  What are you eating and how much? Lobster bisque- regular fat with salad shrimp 1/3 cup, mostly meat, half a scoop of unflavored protein powder   When? 11:30 am   Where? work   Snacks  What are you eating and how much? Beef jerkey (1oz)    When? 1:30 pm   Where? iii   Hydration  What are you eating and how much? water   When? ii   Where? iii   Dinner  What are you eating and how much? Baked chicken (1.5 oz), 1 T of corn, half a scoop of unflavored protein powder    When? 5:30 pm   Where? iii   Snacks  What are you eating and how much? Sugar free fudgesicle    When? ii   Where? iii   Hydration  What are you eating and how much? water   When? ii   Where? iii     Estimate about 70 grams of protein on this day.     Exercise:   Do you currently have an exercise routine? yes  What kind of exercise do you do? walking . For how long? 30 For how often? daily, sometimes more, and is hiking, wt lifting, treadmill, jumprope    Goals:   1. Continue exercise routine.   2. Continue to eat about every 3-4 hours choosing protein snacks.   3. Continue to sprinkle food with unflavored Unjury as a substitution for protein shakes.     F/u prn

## 2010-04-18 NOTE — Patient Instructions (Addendum)
MyChart Activation    Thank you for requesting access to MyChart. Please follow the instructions below to securely access and download your online medical record. MyChart allows you to send messages to your doctor, view your test results, renew your prescriptions, schedule appointments, and more.    How Do I Sign Up?    1. In your internet browser, go to [email or web address].  2. Click on the First Time User? Click Here link in the Sign In box. You will see the New Member Sign Up page.  3. Enter your MyChart Access Code exactly as it appears below. You will not need to use this code after you???ve completed the sign-up process. If you do not sign up before the expiration date, you must request a new code.    MyChart Access Code: [email or web address]  Expires: 07/17/10 10:10 AM (This is the date your MyChart access code will expire)    4. Enter the last four digits of your Social Security Number (xxxx) and Date of Birth (mm/dd/yyyy) as indicated and click Submit. You will be taken to the next sign-up page.  5. Create a MyChart ID. This will be your MyChart login ID and cannot be changed, so think of one that is secure and easy to remember.  6. Create a MyChart password. You can change your password at any time.  7. Enter your Password Reset Question and Answer. This can be used at a later time if you forget your password.   8. Enter your e-mail address. You will receive e-mail notification when new information is available in MyChart.  9. Click Sign Up. You can now view and download portions of your medical record.  10. Click the Download Summary menu link to download a portable copy of your medical information.    Additional Information    If you have questions, please call [phone]. Remember, MyChart is NOT to be used for urgent needs. For medical emergencies, dial 911.      Continue to monitor carbohydrate and protein intake  Remember hydration goals  Continue to take vitamins   Continue to work towards exercise goals - add resistance  Continue regular follow-up with PCP - continue to follow BP carefully  Call office with any future questions or concerns

## 2010-04-18 NOTE — Progress Notes (Signed)
Subjective:   Cassandra Shah  is a 55 y.o. female who presents for follow-up about 4 months following laparoscopic sleeve gastrectomy. She has lost a total of 50 pounds since surgery.  Body mass index is 30.73 kg/(m^2).Marland Kitchen  Loss of EBW is  54%.    Surgery related complication: NA     She  denies vomiting and abdominal pain.  Occ empty "hunger like" feelings in stomach.  Fluid intake:  good                                                Protein intake: good  Meals/day: 4  Met with dietician on month 3 - note reviewed.    Reports being under increased stress - step-mother just diet.    The patient's exercise level: moderately active.  Type of exercise:walking  Changes in her medical history and medications have been reviewed.  Recent visit with PCP who put her back on BP rx & gave prn xanax (which pt has to use once).    Patient Active Problem List   Diagnoses Code   ??? Morbid obesity 278.01   ??? Hypertension 401.9AJ   ??? Sleep apnea 780.57C   ??? Migraine 346.90A       Past Medical History   Diagnosis Date   ??? Morbid obesity 11/16/2009   ??? Hypertension 11/16/2009   ??? Sleep apnea 11/16/2009   ??? GERD (gastroesophageal reflux disease)    ??? SUI (stress urinary incontinence)    ??? Migraines    ??? Hypercholesterolemia        Past Surgical History   Procedure Date   ??? Hx hysterectomy 1983     complete   ??? Hx breast reduction 02/2003   ??? Abdomen surgery proc unlisted 2000     gall bladder   ??? Abdomen surgery proc unlisted 02/2003     tummy tuck   ??? Hx appendectomy 2002     appendix attack         Objective:     BP 120/72   Pulse 70   Resp 16   Ht 5\' 2"  (1.575 m)   Wt 168 lb (76.204 kg)   BMI 30.73 kg/m2     Physical Exam:    General:  alert, cooperative, no distress, appears stated age   Lungs:   clear to auscultation bilaterally   Heart:  Regular rate and rhythm   Abdomen:   abdomen is soft without significant tenderness, masses, organomegaly or guarding;    Incisions: healed         Assessment:      1. History of Morbid obesity, status post  laparoscopic sleeve gastrectomy. Doing well  2.  Situational stress - family death  3.  HBP - good control  4.  GERD    Plan:     1. Reminded to measure portions, continue high protein, low carbohydrate diet  2. Reminded to eat regularly, to eat slowly & not to drink with meals.  3. Reminded of importance of vitamin supplements.  4. To continue to increase activity.  5. Encouraged to attend support group.  6. Discussed effects of stress on pouch.  7. May use pepcid for GERD prn  8. To continue follow-up with PCP.  9. Reminded of labs needed @ 6 month visit.  Lab slip given to pt.  10. Follow-up  in 2 month(s).

## 2010-05-14 LAB — CBC WITH AUTOMATED DIFF
ABS. BASOPHILS: 0 10*3/uL (ref 0.0–0.2)
ABS. EOSINOPHILS: 0.3 10*3/uL (ref 0.0–0.4)
ABS. IMM. GRANS.: 0 10*3/uL (ref 0.0–0.1)
ABS. LYMPHOCYTES: 1.7 10*3/uL (ref 0.7–4.5)
ABS. MONOCYTES: 0.5 10*3/uL (ref 0.1–1.0)
ABS. NEUTROPHILS: 2.7 10*3/uL (ref 1.8–7.8)
BASOPHILS: 1 % (ref 0–3)
EOSINOPHILS: 6 % (ref 0–7)
HCT: 40.1 % (ref 34.0–44.0)
HGB: 13.3 g/dL (ref 11.5–15.0)
IMMATURE GRANULOCYTES: 0 % (ref 0–2)
LYMPHOCYTES: 32 % (ref 14–46)
MCH: 30.1 pg (ref 27.0–34.0)
MCHC: 33.2 g/dL (ref 32.0–36.0)
MCV: 91 fL (ref 80–98)
MONOCYTES: 9 % (ref 4–13)
NEUTROPHILS: 52 % (ref 40–74)
PLATELET: 217 10*3/uL (ref 140–415)
RBC: 4.42 x10E6/uL (ref 3.80–5.10)
RDW: 13.7 % (ref 11.7–15.0)
WBC: 5.1 10*3/uL (ref 4.0–10.5)

## 2010-05-14 LAB — METABOLIC PANEL, COMPREHENSIVE
A-G Ratio: 1.5 (ref 1.1–2.5)
ALT (SGPT): 26 IU/L (ref 0–40)
AST (SGOT): 28 IU/L (ref 0–40)
Albumin: 4 g/dL (ref 3.5–5.5)
Alk. phosphatase: 93 IU/L (ref 25–150)
BUN/Creatinine ratio: 23 (ref 9–23)
BUN: 19 mg/dL (ref 6–24)
Bilirubin, total: 0.3 mg/dL (ref 0.0–1.2)
CO2: 27 mmol/L (ref 20–32)
Calcium: 9 mg/dL (ref 8.7–10.2)
Chloride: 104 mmol/L (ref 97–108)
Creatinine: 0.81 mg/dL (ref 0.57–1.00)
GFR est AA: 95 mL/min/{1.73_m2} (ref >59)
GFR est non-AA: 82 mL/min/{1.73_m2} (ref >59)
GLOBULIN, TOTAL: 2.7 g/dL (ref 1.5–4.5)
Glucose: 94 mg/dL (ref 65–99)
Potassium: 4 mmol/L (ref 3.5–5.2)
Protein, total: 6.7 g/dL (ref 6.0–8.5)
Sodium: 142 mmol/L (ref 134–144)

## 2010-05-14 LAB — VITAMIN B12 & FOLATE
Folate: >19.9 ng/mL (ref >3.0)
Vitamin B12: 1912 pg/mL — ABNORMAL HIGH (ref 211–946)

## 2010-05-14 LAB — FERRITIN: Ferritin: 136 ng/mL (ref 13–150)

## 2010-05-14 LAB — VITAMIN B1 (THIAMINE), BLOOD: Vitamin B1: 100.8 nmol/L (ref 66.5–200.0)

## 2010-05-14 LAB — IRON: Iron: 53 ug/dL (ref 35–155)

## 2010-05-14 LAB — VITAMIN D, 25 HYDROXY: VITAMIN D, 25-HYDROXY: 29.1 ng/mL — ABNORMAL LOW (ref 30.0–100.0)

## 2010-06-13 NOTE — Progress Notes (Signed)
Subjective:     Cassandra Shah  is a 55 y.o. female who presents for follow-up about 6 months following laparoscopic sleeve gastrectomy. She has lost a total of 57 pounds since surgery.  Body mass index is 29.45 kg/(m^2).. EBWL is (61%).    Surgery related complication: none       She reports none and denies vomiting, abdominal pain and diarrhea.      The patient's exercise level: very active. Exercise 3 times week.    Changes in her medical history and medications have been reviewed.    Patient Active Problem List   Diagnoses Code   ??? Morbid obesity 278.01   ??? Migraine 346.90     Past Medical History   Diagnosis Date   ??? Morbid obesity 11/16/2009   ??? Hypertension 11/16/2009   ??? Sleep apnea 11/16/2009   ??? GERD (gastroesophageal reflux disease)    ??? SUI (stress urinary incontinence)    ??? Migraines    ??? Hypercholesterolemia      Past Surgical History   Procedure Date   ??? Hx hysterectomy 1983     complete   ??? Hx breast reduction 02/2003   ??? Abdomen surgery proc unlisted 2000     gall bladder   ??? Abdomen surgery proc unlisted 02/2003     tummy tuck   ??? Hx appendectomy 2002     appendix attack     Current Outpatient Prescriptions   Medication Sig Dispense Refill   ??? flintstones complete (CHILDREN'S VITAMIN) chewable tablet Take 2 Tabs by mouth daily.       ??? b complex-c-e-zn (STRESSTABS W ZINC) tablet Take 1 Tab by mouth daily.       ??? calcium citrate-vitamin d3 (CALCIUM CITRATE + D) 315-200 mg-unit Tab Take 1 Tab by mouth daily (with breakfast).       ??? CYANOCOBALAMIN, VITAMIN B-12, (VITAMIN B-12 SL) by SubLINGual route.       ??? diphenhydrAMINE (BENADRYL) 25 mg capsule Take 25 mg by mouth every six (6) hours as needed.       ??? eszopiclone (LUNESTA) 2 mg tablet Take  by mouth nightly.             Review of Symptoms:         General - No history or complaints of unexpected fever or chills  Head/Neck - No history or complaints of headache or dizziness   Cardiac - No history or complaints of chest pain, palpitations, or shortness of breath  Pulmonary - No history or complaints of shortness of breath or productive cough  Gastrointestinal - as noted above  Genitourinary - No history or complaints of hematuria/dysuria or renal lithiasis  Musculoskeletal - No history or complaints of joint  muscular weakness  Hematologic - No history of any bleeding episodes  Neurologic - No history or complaints of  migraine headaches or neurologic symptoms        Objective:     BP 130/82   Pulse 76   Resp 16   Ht 5\' 2"  (1.575 m)   Wt 161 lb (73.029 kg)   BMI 29.45 kg/m2     Physical Exam:    General:  alert, cooperative, no distress, appears stated age   Lungs:   clear to auscultation bilaterally   Heart:  Regular rate and rhythm   Abdomen:   abdomen is soft without significant tenderness, masses, organomegaly or guarding;    Incisions: healing well  Labs:     Recent Results (from the past 2016 hour(s))   CBC WITH AUTOMATED DIFF    Collection Time    05/10/10 12:00 AM       Component Value Range    WBC 5.1  4.0 - 10.5 (x10E3/uL)    RBC 4.42  3.80 - 5.10 (x10E6/uL)    HGB 13.3  11.5 - 15.0 (g/dL)    HCT 40.9  81.1 - 91.4 (%)    MCV 91  80 - 98 (fL)    MCH 30.1  27.0 - 34.0 (pg)    MCHC 33.2  32.0 - 36.0 (g/dL)    RDW 78.2  95.6 - 21.3 (%)    PLATELET 217  140 - 415 (x10E3/uL)    NEUTROPHILS 52  40 - 74 (%)    LYMPHOCYTES 32  14 - 46 (%)    MONOCYTES 9  4 - 13 (%)    EOSINOPHILS 6  0 - 7 (%)    BASOPHILS 1  0 - 3 (%)    Immature cells CANCELED      ABSOLUTE NEUTS 2.7  1.8 - 7.8 (x10E3/uL)    ABSOLUTE LYMPHS 1.7  0.7 - 4.5 (x10E3/uL)    ABSOLUTE MONOS 0.5  0.1 - 1.0 (x10E3/uL)    ABSOLUTE EOSINS 0.3  0.0 - 0.4 (x10E3/uL)    ABSOLUTE BASOS 0.0  0.0 - 0.2 (x10E3/uL)    IMM. GRANS. 0  0 - 2 (%)    ABS. IMM. GRANS. 0.0  0.0 - 0.1 (x10E3/uL)    NRBC CANCELED      Hematology Comments: CANCELED     FERRITIN    Collection Time    05/10/10 12:00 AM       Component Value Range     Ferritin 136  13 - 150 (ng/mL)   IRON    Collection Time    05/10/10 12:00 AM       Component Value Range    Iron 53  35 - 155 (ug/dL)   METABOLIC PANEL, COMPREHENSIVE    Collection Time    05/10/10 12:00 AM       Component Value Range    Glucose 94  65 - 99 (mg/dL)    BUN 19  6 - 24 (mg/dL)    Creatinine 0.86  5.78 - 1.00 (mg/dL)    GFR est non-AA 82  >59 (mL/min/1.73)    GFR est AA 95  >59 (mL/min/1.73)    BUN/Creatinine ratio 23  9 - 23     Sodium 142  134 - 144 (mmol/L)    Potassium 4.0  3.5 - 5.2 (mmol/L)    Chloride 104  97 - 108 (mmol/L)    CO2 27  20 - 32 (mmol/L)    Calcium 9.0  8.7 - 10.2 (mg/dL)    Protein, total 6.7  6.0 - 8.5 (g/dL)    Albumin 4.0  3.5 - 5.5 (g/dL)    GLOBULIN, TOTAL 2.7  1.5 - 4.5 (g/dL)    A-G Ratio 1.5  1.1 - 2.5     Bilirubin, total 0.3  0.0 - 1.2 (mg/dL)    Alk. phosphatase 93  25 - 150 (IU/L)    AST 28  0 - 40 (IU/L)    ALT 26  0 - 40 (IU/L)   VITAMIN B1 (THIAMINE), BLOOD    Collection Time    05/10/10 12:00 AM       Component Value Range    Vit. B1, Whole Blood  100.8  66.5 - 200.0 (nmol/L)   VITAMIN B12 & FOLATE    Collection Time    05/10/10 12:00 AM       Component Value Range    Vitamin B12 1912 (*) 211 - 946 (pg/mL)    Folate >19.9  >3.0 (ng/mL)   VITAMIN D, 25 HYDROXY    Collection Time    05/10/10 12:00 AM       Component Value Range    VITAMIN D, 25-HYDROXY 29.1 (*) 30.0 - 100.0 (ng/mL)         Assessment:     1. History of Morbid obesity, status post  laparoscopic sleeve gastrectomy. Doing well; no concerns..     Plan:     1. Remember to measure portions, continue low carbohydrate diet  2. Lab reviewed with patient and appropriate changes were made to vitamin regimen.  3. Remember vitamin supplements.  4. Exercise regimen appears adequate.  5. Attend support group  6. Follow-up in 3 month(s).  7. Total time spent with the patient 20 minutes.

## 2010-06-13 NOTE — Patient Instructions (Addendum)
Patient Instructions      1. Continue to monitor carbohydrate and protein intake- remember to keep your           total  carbohydrates to 50 grams or less per day  2. Remember hydration goals - usually 48 to 64 ounces of liquids per day  3. Continue to work towards exercise goals - minimum 3 days per week of 45          minutes to  1 hour at a time.  4. Remember to take vitamins as directed        Supplement Resource Guide    Importance of Protein:   Maintains lean body mass, produces antibodies to fight off infections, heals wounds, minimizes hair loss, helps to give you energy, helps with satiety, and keeping you full between meals.      Importance of Calcium:  Needed for healthy bones and teeth, normal blood clotting, and nervous system functioning, higher risk of osteoporosis and bone disease with non-compliance.      Importance of Multivitamins:  Many functions.  Supply you with extra nutrients that you may be missing from food.  May lead to iron deficiency anemia, weakness, fatigue, and many other symptoms with non-compliance.      Importance of B Vitamins:  Important for red blood cell formation, metabolism, energy, and helps to maintain a healthy nervous system.                                        Protein Supplement  Find one you like now. Use immediately after surgery.   Look for:  35-50g protein each day from your protein supplement once you reach the progression diet.    0-3 g fat per serving  0-3 g sugar per serving    Protein drinks should be split in separate dosages.    Recommend: Lifelong  1 year + Calcium Supplement:   Start taking within a month after surgery.   Look for: Calcium Citrate Plus D (1500 mg per day)  Recommend: Citracal    Recommend crushing calcium tablets for better absorption.   Marland Kitchen  Avoid chocolate chewable calcium. Can use chewable bariatric or [employer] brand or similar chewable.     The body cannot absorb more than 500-600 mg @ a time.  Take for Life Multi-vitamin Supplement:    1st Month After Surgery: Any complete chewable, such as: Flintstone???s Complete chewables.      Avoid Flintstone sours or gummies.  They lack iron and other important nutrients and also have added sugar.    Continue with chewable vitamin or change to adult complete multivitamin one month after surgery. Menstruating women can take a prenatal vitamin.          After surgery doses:   Adult complete (make sure has at least 18 mg iron and 400-800 mcg folic acid):    Vitamin B12, B Complex Vitamin, and Biotin  Start taking within a month after surgery.   Vitamin B12:  500 mcg of Vitamin B12 daily or 1000 mcg three times a week .    Must take sublingually (meaning you take it under your tongue) or in a liquid drop form for easy absorption.        B Complex Vitamin: Take a pill or liquid drop form once daily.       Biotin: This vitamin can help prevent hair loss.  Recommend 5mg    (5000 mcg) a day  Biotin is Optional        MyChart Activation    Thank you for requesting access to MyChart. Please follow the instructions below to securely access and download your online medical record. MyChart allows you to send messages to your doctor, view your test results, renew your prescriptions, schedule appointments, and more.    How Do I Sign Up?    1. In your internet browser, go to [email or web address]  2. Click on the First Time User? Click Here link in the Sign In box. You will be redirect to the New Member Sign Up page.  3. Enter your MyChart Access Code exactly as it appears below. You will not need to use this code after you???ve completed the sign-up process. If you do not sign up before the expiration date, you must request a new code.    MyChart Access Code: [email or web address]  Expires: 07/17/2010 10:10 AM (This is the date your MyChart access code will expire)     4. Enter the last four digits of your Social Security Number (xxxx) and Date of Birth (mm/dd/yyyy) as indicated and click Submit. You will be taken to the next sign-up page.  5. Create a MyChart ID. This will be your MyChart login ID and cannot be changed, so think of one that is secure and easy to remember.  6. Create a MyChart password. You can change your password at any time.  7. Enter your Password Reset Question and Answer. This can be used at a later time if you forget your password.   8. Enter your e-mail address. You will receive e-mail notification when new information is available in MyChart.  9. Click Sign Up. You can now view and download portions of your medical record.  10. Click the Download Summary menu link to download a portable copy of your medical information.    Additional Information    If you have questions, please call [phone]. Remember, MyChart is NOT to be used for urgent needs. For medical emergencies, dial 911.

## 2010-09-19 NOTE — Progress Notes (Signed)
9.5 month follow-up    Subjective:     Cassandra Shah  is a 55 y.o. female who presents for follow-up about 9 months following laparoscopic sleeve gastrectomy. She has lost a total of 61 pounds since surgery.  Body mass index is 28.72 kg/(m^2).. EBWL is (66%). The patient presents today to assess their progress toward their goal of weight loss and to address any issues that may be present.  Today the patient and I have reviewed their diet and how appropriate their food choices are.  The following issues have been identified - NA.  .    Surgery related complication: NA       She reports no real issues and denies vomiting, abdominal pain and diarrhea.      The patient's exercise level: very active.    Changes in her medical history and medications have been reviewed.    Patient Active Problem List   Diagnoses Code   ??? Morbid obesity 278.01   ??? Migraine 346.90     Past Medical History   Diagnosis Date   ??? Morbid obesity 11/16/2009   ??? Hypertension 11/16/2009   ??? Sleep apnea 11/16/2009   ??? GERD (gastroesophageal reflux disease)    ??? SUI (stress urinary incontinence)    ??? Migraines    ??? Hypercholesterolemia      Past Surgical History   Procedure Date   ??? Hx hysterectomy 1983     complete   ??? Hx breast reduction 02/2003   ??? Abdomen surgery proc unlisted 2000     gall bladder   ??? Abdomen surgery proc unlisted 02/2003     tummy tuck   ??? Hx appendectomy 2002     appendix attack   ??? Hx gi 2011     sleeve resection     Current Outpatient Prescriptions   Medication Sig Dispense Refill   ??? butalbital-acetaminophen-caffeine (FIORICET) 50-325-40 mg per tablet Take 1 Tab by mouth.         ??? flintstones complete (CHILDREN'S VITAMIN) chewable tablet Take 2 Tabs by mouth daily.       ??? b complex-c-e-zn (STRESSTABS W ZINC) tablet Take 1 Tab by mouth daily.       ??? calcium citrate-vitamin d3 (CALCIUM CITRATE + D) 315-200 mg-unit Tab Take 1 Tab by mouth daily (with breakfast).        ??? CYANOCOBALAMIN, VITAMIN B-12, (VITAMIN B-12 SL) by SubLINGual route.       ??? eszopiclone (LUNESTA) 2 mg tablet Take  by mouth nightly.              Review of Symptoms:         General - No history or complaints of unexpected fever or chills  Head/Neck - No history or complaints of headache or dizziness  Cardiac - No history or complaints of chest pain, palpitations, or shortness of breath  Pulmonary - No history or complaints of shortness of breath or productive cough  Gastrointestinal - as noted above  Genitourinary - No history or complaints of hematuria/dysuria or renal lithiasis  Musculoskeletal - No history or complaints of joint  muscular weakness  Hematologic - No history of any bleeding episodes  Neurologic - No history or complaints of  migraine headaches or neurologic symptoms          Objective:     BP 118/72   Pulse 70   Resp 16   Ht 5\' 2"  (1.575 m)   Wt 157 lb (71.215 kg)  BMI 28.72 kg/m2     Physical Exam:    General:  alert, cooperative, no distress, appears stated age   Lungs:   clear to auscultation bilaterally   Heart:  Regular rate and rhythm   Abdomen:   abdomen is soft without significant tenderness, masses, organomegaly or guarding;    Incisions: healing well, no significant drainage         Assessment:     1. History of Morbid obesity, status post  laparoscopic sleeve gastrectomy. Doing well, no concerns.  Has another 15 lbs wt loss til goal.     Plan:     1. Remember to measure portions, continue low carbohydrate diet  2. Continue to watch portion size.  3. Remember vitamin supplements.  4. Exercise regimen appears adequate.  5. Attend support group  6. Follow-up in 3 month(s).  7. Total time spent with the patient 30 minutes.  Obtain lab prior to next visit which will be 1 year anniversary.      I have personally seen the patient with my physician extender,reviewed the data, examined the patient and reviewed the treatment course and agree with the current plan of action.         Angelique Holm M.D.

## 2010-09-19 NOTE — Patient Instructions (Signed)
Patient Instructions  1. Continue to monitor carbohydrate and protein intake- remember to keep your total          carbohydrates to 50 grams or less per day  2. Remember hydration goals - usually 48 to 64 ounces of liquids per day  3. Continue to work towards exercise goals - minimum 3 days per week of 45 minutes to     1 hour at a time.  4. Remember to take vitamins as directed

## 2010-12-17 NOTE — Patient Instructions (Signed)
Must contact PCP about BP ASAP!  Continue to monitor carbohydrate and protein intake.  Eat regularly.    Remember to stay hydrated  Continue to take vitamins  Continue to work towards exercise goals  Get labs done - call office afterwards to discuss results  Call office with any future questions or concerns

## 2010-12-17 NOTE — Progress Notes (Signed)
Subjective:   Cassandra Shah  is a 55 y.o. female who presents for follow-up about 1 year following laparoscopic sleeve gastrectomy. She has lost a total of 61 pounds since surgery.  Body mass index is 28.72 kg/(m^2).Marland Kitchen  Loss of EBW is 66%.  Her weight has been stable over the past 3 months.    Surgery related complication: NA     She is tolerating solid foods without difficulty and denies nausea,vomiting and abdominal/epigastric pain.  Fluid intake:  good                              Protein intake:  good - food + protein bars    The patient's exercise level: moderately active.  Type of exercise: active daily living, treadmill, sailing.    Changes in her medical history and medications have been reviewed. She reports current increased family stress.  States she was on BP rx pre-op, but that rx was dc'd.  Was also on xanax prn pre-op. She also reports that she went to [facility] ER 6/12 for kidney stones.    Patient Active Problem List   Diagnoses Code   ??? Morbid obesity 278.01   ??? Migraine 346.90   ??? Hypertension 401.9   ??? Intestinal malabsorption 579.9   ??? Hypercholesteremia 272.0     Past Medical History   Diagnosis Date   ??? Morbid obesity 11/16/2009   ??? Hypertension 11/16/2009   ??? Sleep apnea 11/16/2009   ??? GERD (gastroesophageal reflux disease)    ??? SUI (stress urinary incontinence)    ??? Migraines    ??? Hypercholesterolemia      Past Surgical History   Procedure Date   ??? Hx hysterectomy 1983     complete   ??? Hx breast reduction 02/2003   ??? Pr abdomen surgery proc unlisted 2000     gall bladder   ??? Pr abdomen surgery proc unlisted 02/2003     tummy tuck   ??? Hx appendectomy 2002     appendix attack   ??? Hx gi 2011     sleeve resection     Current Outpatient Prescriptions   Medication Sig Dispense Refill   ??? butalbital-acetaminophen-caffeine (FIORICET) 50-325-40 mg per tablet Take 1 Tab by mouth.         ??? flintstones complete (CHILDREN'S VITAMIN) chewable tablet Take 2 Tabs by mouth daily.        ??? b complex-c-e-zn (STRESSTABS W ZINC) tablet Take 1 Tab by mouth daily.       ??? calcium citrate-vitamin d3 (CALCIUM CITRATE + D) 315-200 mg-unit Tab Take 1 Tab by mouth daily (with breakfast).       ??? CYANOCOBALAMIN, VITAMIN B-12, (VITAMIN B-12 SL) by SubLINGual route.       ??? eszopiclone (LUNESTA) 2 mg tablet Take  by mouth nightly.           Review of Systems:  General - Denies fatigue, fever, chills  Cardiac - Denies chest pain, palpitations, shortness of breath  Pulmonary - Denies shortness of breath, productive cough  GI - as noted above  Musculoskeletal - Denies joint or muscular weakness, pain, stiffness  Hematologic - Denies abnormal bleeding, bruising  Neurologic -  Denies headache,weakness, paralysis, numbness, tingling. No recent migraines.    Objective:     BP 158/98   Pulse 76   Resp 16   Ht 5\' 2"  (1.575 m)   Wt 157 lb (71.215  kg)   BMI 28.72 kg/m2     Physical Exam:    General:  alert, cooperative, no distress, appears stated age.  Very talkative about recent stressors.   Lungs:   clear to auscultation bilaterally   Heart:  Regular rate and rhythm   Abdomen:   abdomen is soft without significant tenderness, masses, organomegaly or guarding;    Incisions: healed       Labs:   No results found for this or any previous visit (from the past 2016 hour(s)).  States she lost her lab slip.    Assessment:     1. History of Morbid obesity, status post  laparoscopic sleeve gastrectomy.   2.  HBP    Plan:     1. To contact PCP about HBP ASAP.  Pt verbally agreed.  Reviewed dangers of HBP.  2. Discussed effects of stress on pouch.  3. To continue focus on hydration.  4. Reminded to measure portions, continue high protein, low carbohydrate diet. Reminded to eat regularly.  Dietician support offered.  5. Reminded of importance of vitamin supplements.  6. To continue to increase activity.  7. Encouraged attendance @ support group  8. New slip given for labs.  Pt to call 1 wk after lab draw to discuss results.   9. To follow-up in 6 month(s) or before if needed.  To call if questions/concerns prior to office visit.  10. Total time spent with pt - 20 minutes

## 2011-02-04 LAB — IRON: Iron: 133 ug/dL (ref 35–155)

## 2011-02-04 LAB — CBC/DIFF AMBIGUOUS DEFAULT
ABS. BASOPHILS: 0 10*3/uL (ref 0.0–0.2)
ABS. EOSINOPHILS: 0.1 10*3/uL (ref 0.0–0.4)
ABS. IMM. GRANS.: 0 10*3/uL (ref 0.0–0.1)
ABS. MONOCYTES: 0.3 10*3/uL (ref 0.1–1.0)
ABS. NEUTROPHILS: 1.5 10*3/uL — ABNORMAL LOW (ref 1.8–7.8)
Abs Lymphocytes: 1.9 10*3/uL (ref 0.7–4.5)
BASOPHILS: 1 % (ref 0–3)
EOSINOPHILS: 3 % (ref 0–7)
HCT: 41.2 % (ref 34.0–46.6)
HGB: 13.6 g/dL (ref 11.1–15.9)
IMMATURE GRANULOCYTES: 0 % (ref 0–2)
Lymphocytes: 49 % — ABNORMAL HIGH (ref 14–46)
MCH: 29.1 pg (ref 26.6–33.0)
MCHC: 33 g/dL (ref 31.5–35.7)
MCV: 88 fL (ref 79–97)
MONOCYTES: 8 % (ref 4–13)
NEUTROPHILS: 39 % — ABNORMAL LOW (ref 40–74)
PLATELET: 214 10*3/uL (ref 140–415)
RBC: 4.68 x10E6/uL (ref 3.77–5.28)
RDW: 12.9 % (ref 12.3–15.4)
WBC: 3.8 10*3/uL — ABNORMAL LOW (ref 4.0–10.5)

## 2011-02-04 LAB — VITAMIN D, 25 HYDROXY: VITAMIN D, 25-HYDROXY: 32.3 ng/mL (ref 30.0–100.0)

## 2011-02-04 LAB — METABOLIC PANEL, COMPREHENSIVE
A-G Ratio: 1.7 (ref 1.1–2.5)
ALT (SGPT): 20 IU/L (ref 0–40)
AST (SGOT): 23 IU/L (ref 0–40)
Albumin: 4.5 g/dL (ref 3.5–5.5)
Alk. phosphatase: 98 IU/L (ref 25–150)
BUN/Creatinine ratio: 24 — ABNORMAL HIGH (ref 9–23)
BUN: 20 mg/dL (ref 6–24)
Bilirubin, total: 0.3 mg/dL (ref 0.0–1.2)
CO2: 26 mmol/L (ref 20–32)
Calcium: 9.8 mg/dL (ref 8.7–10.2)
Chloride: 103 mmol/L (ref 97–108)
Creatinine: 0.82 mg/dL (ref 0.57–1.00)
GFR est non-AA: 80 mL/min/{1.73_m2} (ref >59)
GLOBULIN, TOTAL: 2.7 g/dL (ref 1.5–4.5)
Glucose: 96 mg/dL (ref 65–99)
Potassium: 4.4 mmol/L (ref 3.5–5.2)
Protein, total: 7.2 g/dL (ref 6.0–8.5)
Sodium: 142 mmol/L (ref 134–144)
eGFR If African American: 93 mL/min/{1.73_m2} (ref >59)

## 2011-02-04 LAB — FERRITIN: Ferritin: 115 ng/mL (ref 13–150)

## 2011-02-04 LAB — VITAMIN B12 & FOLATE
Folate: 16.1 ng/mL (ref >3.0)
Vitamin B12: 1714 pg/mL — ABNORMAL HIGH (ref 211–946)

## 2011-02-04 LAB — AMBIG ABBREV CMP14 DEFAULT

## 2011-02-08 LAB — VITAMIN B1 (THIAMINE), BLOOD: Vitamin B1: 111.9 nmol/L (ref 66.5–200.0)

## 2011-06-19 NOTE — Patient Instructions (Signed)
Patient Instructions      1. Continue to monitor carbohydrate and protein intake- remember to keep your           total  carbohydrates to 50 grams or less per day  2. Remember hydration goals - usually 48 to 64 ounces of liquids per day  3. Continue to work towards exercise goals - minimum 3 days per week of 45          minutes to  1 hour at a time.  4. Remember to take vitamins as directed        Supplement Resource Guide    Importance of Protein:   Maintains lean body mass, produces antibodies to fight off infections, heals wounds, minimizes hair loss, helps to give you energy, helps with satiety, and keeping you full between meals.      Importance of Calcium:  Needed for healthy bones and teeth, normal blood clotting, and nervous system functioning, higher risk of osteoporosis and bone disease with non-compliance.      Importance of Multivitamins:  Many functions.  Supply you with extra nutrients that you may be missing from food.  May lead to iron deficiency anemia, weakness, fatigue, and many other symptoms with non-compliance.      Importance of B Vitamins:  Important for red blood cell formation, metabolism, energy, and helps to maintain a healthy nervous system.    Protein Supplement  Find one you like now. Use immediately after surgery.   Look for:  35-50g protein each day from your protein supplement once you reach the progression diet.    0-3 g fat per serving  0-3 g sugar per serving    Protein drinks should be split in separate dosages.    Recommend: Lifelong  1 year + Calcium Supplement:   Start taking within a month after surgery.   Look for: Calcium Citrate Plus D (1500 mg per day)  Recommend: Citracal    Recommend crushing calcium tablets for better absorption.   .  Avoid chocolate chewable calcium. Can use chewable bariatric or [employer] brand or similar chewable.    The body cannot absorb more than 500-600 mg @ a time.  Take for Life  Multi-vitamin Supplement:    1st Month After Surgery: Any complete chewable, such as: Flintstone???s Complete chewables.      Avoid Flintstone sours or gummies.  They lack iron and other important nutrients and also have added sugar.    Continue with chewable vitamin or change to adult complete multivitamin one month after surgery. Menstruating women can take a prenatal vitamin.          After surgery doses:   Adult complete (make sure has at least 18 mg iron and 400-800 mcg folic acid):    Vitamin B12, B Complex Vitamin, and Biotin  Start taking within a month after surgery.   Vitamin B12:  500 mcg of Vitamin B12 daily or 1000 mcg three times a week .    Must take sublingually (meaning you take it under your tongue) or in a liquid drop form for easy absorption.        B Complex Vitamin: Take a pill or liquid drop form once daily.       Biotin: This vitamin can help prevent hair loss.    Recommend 5mg   (5000 mcg) a day  Biotin is Optional

## 2011-06-19 NOTE — Progress Notes (Signed)
Subjective:     Cassandra Shah  is a 56 y.o. female who presents for follow-up about 18 months following laparoscopic sleeve gastrectomy. She has lost a total of 47 pounds since surgery.  Body mass index is 31.28 kg/(m^2).. EBWL is (50%). The patient presents today to assess their progress toward their goal of weight loss and to address any issues that may be present.  Today the patient and I have reviewed their diet and how appropriate their food choices are.  The following issues have been identified :no issues.  .    Surgery related complication: none       She reports no issues and denies vomiting, abdominal pain and diarrhea.      The patient's exercise level: moderately active or exercises 2 times a week.    Changes in her medical history and medications have been reviewed.    Patient Active Problem List   Diagnoses Code   ??? Morbid obesity 278.01   ??? Migraine 346.90   ??? Intestinal malabsorption 579.9     Past Medical History   Diagnosis Date   ??? Morbid obesity 11/16/2009   ??? Hypertension 11/16/2009   ??? Sleep apnea 11/16/2009   ??? GERD (gastroesophageal reflux disease)    ??? SUI (stress urinary incontinence)    ??? Migraines    ??? Hypercholesterolemia      Past Surgical History   Procedure Date   ??? Hx hysterectomy 1983     complete   ??? Hx breast reduction 02/2003   ??? Pr abdomen surgery proc unlisted 2000     gall bladder   ??? Pr abdomen surgery proc unlisted 02/2003     tummy tuck   ??? Hx appendectomy 2002     appendix attack   ??? Hx gi 2011     sleeve resection   ??? Hx gi      laparotomy     Current Outpatient Prescriptions   Medication Sig Dispense Refill   ??? butalbital-acetaminophen-caffeine (FIORICET) 50-325-40 mg per tablet Take 1 Tab by mouth.         ??? flintstones complete (CHILDREN'S VITAMIN) chewable tablet Take 2 Tabs by mouth daily.       ??? b complex-c-e-zn (STRESSTABS W ZINC) tablet Take 1 Tab by mouth daily.       ??? calcium citrate-vitamin d3 (CALCIUM CITRATE + D) 315-200 mg-unit Tab Take 1 Tab by mouth  daily (with breakfast).       ??? CYANOCOBALAMIN, VITAMIN B-12, (VITAMIN B-12 SL) 1,000 mcg by SubLINGual route daily.       ??? eszopiclone (LUNESTA) 2 mg tablet Take  by mouth nightly.             Review of Symptoms:         General - No history or complaints of unexpected fever or chills  Head/Neck - No history or complaints of headache or dizziness  Cardiac - No history or complaints of chest pain, palpitations, or shortness of breath  Pulmonary - No history or complaints of shortness of breath or productive cough  Gastrointestinal - as noted above  Genitourinary - No history or complaints of hematuria/dysuria or renal lithiasis  Musculoskeletal - No history or complaints of joint  muscular weakness  Hematologic - No history of any bleeding episodes  Neurologic - No history or complaints of  migraine headaches or neurologic symptoms        Objective:     BP 112/76   Pulse 70  Resp 16   Ht 5\' 2"  (1.575 m)   Wt 171 lb (77.565 kg)   BMI 31.28 kg/m2     Physical Exam:    General:  alert, cooperative, no distress, appears stated age   Lungs:   clear to auscultation bilaterally   Heart:  Regular rate and rhythm   Abdomen:   abdomen is soft without significant tenderness, masses, organomegaly or guarding;    Incisions: Healed without hernias         Labs:   No results found for this or any previous visit (from the past 2016 hour(s)).      Assessment:     1. History of Morbid obesity, status post  laparoscopic sleeve gastrectomy. Doing well; no concerns..     Plan:     1. Remember to measure portions, continue low carbohydrate diet  2. Lab reviewed with patient and appropriate changes were made to vitamin regimen.  3. Remember vitamin supplements.  4. Exercise regimen appears adequate.  5. Attend support group  6. Follow-up in 5 month(s).  7. Total time spent with the patient 30 minutes.

## 2012-10-07 ENCOUNTER — Encounter

## 2014-01-12 DIAGNOSIS — I2510 Atherosclerotic heart disease of native coronary artery without angina pectoris: Secondary | ICD-10-CM

## 2014-01-12 HISTORY — DX: Atherosclerotic heart disease of native coronary artery without angina pectoris: I25.10

## 2014-01-28 ENCOUNTER — Encounter (HOSPITAL_COMMUNITY): Payer: Self-pay | Admitting: Emergency Medicine

## 2014-01-28 ENCOUNTER — Inpatient Hospital Stay (HOSPITAL_COMMUNITY)
Admission: EM | Admit: 2014-01-28 | Discharge: 2014-01-31 | DRG: 247 | Disposition: A | Discharge status: Home / Self Care | Payer: Medicaid Other | Attending: Internal Medicine | Admitting: Internal Medicine

## 2014-01-28 ENCOUNTER — Emergency Department (HOSPITAL_COMMUNITY): Payer: Medicaid Other

## 2014-01-28 DIAGNOSIS — I10 Essential (primary) hypertension: Secondary | ICD-10-CM

## 2014-01-28 DIAGNOSIS — I517 Cardiomegaly: Secondary | ICD-10-CM

## 2014-01-28 DIAGNOSIS — Z23 Encounter for immunization: Secondary | ICD-10-CM

## 2014-01-28 DIAGNOSIS — I25110 Atherosclerotic heart disease of native coronary artery with unstable angina pectoris: Secondary | ICD-10-CM

## 2014-01-28 DIAGNOSIS — Z79899 Other long term (current) drug therapy: Secondary | ICD-10-CM

## 2014-01-28 DIAGNOSIS — I2510 Atherosclerotic heart disease of native coronary artery without angina pectoris: Secondary | ICD-10-CM

## 2014-01-28 DIAGNOSIS — Z6825 Body mass index (BMI) 25.0-25.9, adult: Secondary | ICD-10-CM

## 2014-01-28 DIAGNOSIS — E039 Hypothyroidism, unspecified: Secondary | ICD-10-CM

## 2014-01-28 DIAGNOSIS — E663 Overweight: Secondary | ICD-10-CM

## 2014-01-28 DIAGNOSIS — Z716 Tobacco abuse counseling: Secondary | ICD-10-CM | POA: Diagnosis not present

## 2014-01-28 DIAGNOSIS — F17210 Nicotine dependence, cigarettes, uncomplicated: Secondary | ICD-10-CM | POA: Diagnosis present

## 2014-01-28 DIAGNOSIS — I209 Angina pectoris, unspecified: Secondary | ICD-10-CM

## 2014-01-28 DIAGNOSIS — Z8249 Family history of ischemic heart disease and other diseases of the circulatory system: Secondary | ICD-10-CM

## 2014-01-28 DIAGNOSIS — Z955 Presence of coronary angioplasty implant and graft: Secondary | ICD-10-CM

## 2014-01-28 DIAGNOSIS — Z720 Tobacco use: Secondary | ICD-10-CM

## 2014-01-28 DIAGNOSIS — I200 Unstable angina: Secondary | ICD-10-CM | POA: Diagnosis present

## 2014-01-28 DIAGNOSIS — I5022 Chronic systolic (congestive) heart failure: Secondary | ICD-10-CM

## 2014-01-28 DIAGNOSIS — E780 Pure hypercholesterolemia: Secondary | ICD-10-CM | POA: Diagnosis present

## 2014-01-28 DIAGNOSIS — R079 Chest pain, unspecified: Secondary | ICD-10-CM

## 2014-01-28 HISTORY — DX: Essential (primary) hypertension: I10

## 2014-01-28 LAB — BASIC METABOLIC PANEL
ANION GAP: 12 (ref 5–15)
BUN: 16 mg/dL (ref 6–23)
CHLORIDE: 104 meq/L (ref 96–112)
CO2: 24 mEq/L (ref 19–32)
Calcium: 9.3 mg/dL (ref 8.4–10.5)
Creatinine, Ser: 0.81 mg/dL (ref 0.50–1.10)
GFR calc non Af Amer: 78 mL/min — ABNORMAL LOW (ref >90)
Glucose, Bld: 92 mg/dL (ref 70–99)
POTASSIUM: 4 meq/L (ref 3.7–5.3)
SODIUM: 140 meq/L (ref 137–147)

## 2014-01-28 LAB — CBC
HCT: 41.9 % (ref 36.0–46.0)
Hemoglobin: 14.8 g/dL (ref 12.0–15.0)
MCH: 35.1 pg — ABNORMAL HIGH (ref 26.0–34.0)
MCHC: 35.3 g/dL (ref 30.0–36.0)
MCV: 99.3 fL (ref 78.0–100.0)
PLATELETS: 203 10*3/uL (ref 150–400)
RBC: 4.22 MIL/uL (ref 3.87–5.11)
RDW: 13.4 % (ref 11.5–15.5)
WBC: 7.4 10*3/uL (ref 4.0–10.5)

## 2014-01-28 LAB — I-STAT TROPONIN, ED: TROPONIN I, POC: 0.01 ng/mL (ref 0.00–0.08)

## 2014-01-28 LAB — PRO B NATRIURETIC PEPTIDE: PRO B NATRI PEPTIDE: 42.1 pg/mL (ref 0–125)

## 2014-01-28 NOTE — ED Notes (Signed)
Patient asked for and received a sprite and ice.

## 2014-01-28 NOTE — ED Notes (Signed)
XR at bedside

## 2014-01-28 NOTE — ED Notes (Signed)
Patient began having chest pain last night at 8pm. Patient states the pain went away and then came back today. Chest pain is substernal with radiation to left arm and left jaw which patient describes as a pressure. Patient is experiencing SOB and nausea. Hx of smoking and elevated cholesterol. EMS gave 324 ASA and 2 SL nitro. Pain decreased from 6/10 to 5/10 with nitro.

## 2014-01-28 NOTE — ED Provider Notes (Signed)
CSN: [id number]     Arrival date & time 01/28/14  2033 History   First MD Initiated Contact with Patient 01/28/14 2039     Chief Complaint  Patient presents with  . Chest Pain     (Consider location/radiation/quality/duration/timing/severity/associated sxs/prior Treatment) HPI  Diana Erickson is a 58 y.o. female who is here for evaluation of treatment, chest pressure, which started last night and radiates to the left jaw, and left arm. The current episode of the discomfort is 6/10, and started at 1 PM, without provocation. After treatment by EMS with nitroglycerin and aspirin. The pain improved to 5/10. There has been no associated nausea, diaphoresis, weakness, dizziness, cough, shortness of breath, or back pain. She's never had this previously. She smokes cigarettes. She does not have a local primary medical doctor. She's never had cardiac problems in the past. She has a family history of "open heart surgery" in 2 sisters. She is taking her usual medications, without relief. There are no other known modifying factors.   Past Medical History  Diagnosis Date  . Elevated cholesterol    History reviewed. No pertinent past surgical history. No family history on file. History  Substance Use Topics  . Smoking status: Current Every Day Smoker  . Smokeless tobacco: Not on file  . Alcohol Use: Yes   OB History   Grav Para Term Preterm Abortions TAB SAB Ect Mult Living                 Review of Systems  All other systems reviewed and are negative.     Allergies  Review of patient's allergies indicates no known allergies.  Home Medications   Prior to Admission medications   Medication Sig Start Date End Date Taking? Authorizing Provider  furosemide (LASIX) 20 MG tablet Take 20 mg by mouth daily.   Yes Historical Provider, MD  levothyroxine (SYNTHROID, LEVOTHROID) 100 MCG tablet Take 100 mcg by mouth daily before breakfast.   Yes Historical Provider, MD  lisinopril  (PRINIVIL,ZESTRIL) 10 MG tablet Take 10 mg by mouth daily.   Yes Historical Provider, MD  potassium chloride SA (K-DUR,KLOR-CON) 20 MEQ tablet Take 20 mEq by mouth 2 (two) times daily.   Yes Historical Provider, MD  pravastatin (PRAVACHOL) 40 MG tablet Take 40 mg by mouth every evening.   Yes Historical Provider, MD   BP 120/75  Pulse 64  Temp(Src) 98 F (36.7 C) (Oral)  Resp 15  Ht 5\' 9"  (1.753 m)  Wt 175 lb (79.379 kg)  BMI 25.83 kg/m2  SpO2 97% Physical Exam  Nursing note and vitals reviewed. Constitutional: She is oriented to person, place, and time. She appears well-developed and well-nourished.  HENT:  Head: Normocephalic and atraumatic.  Eyes: Conjunctivae and EOM are normal. Pupils are equal, round, and reactive to light.  Neck: Normal range of motion and phonation normal. Neck supple.  Cardiovascular: Normal rate and regular rhythm.   Pulmonary/Chest: Effort normal. No respiratory distress. She has no wheezes. She exhibits no tenderness.  Decreased air movement, bilateral.  Abdominal: Soft. She exhibits no distension. There is no tenderness. There is no guarding.  Musculoskeletal: Normal range of motion. She exhibits no edema and no tenderness.  Neurological: She is alert and oriented to person, place, and time. She exhibits normal muscle tone.  Skin: Skin is warm and dry.  Psychiatric: She has a normal mood and affect. Her behavior is normal. Judgment and thought content normal.    ED Course  Procedures (including critical  care time) Medications - No data to display  Patient Vitals for the past 24 hrs:  BP Temp Temp src Pulse Resp SpO2 Height Weight  01/28/14 2245 120/75 mmHg - - 64 15 97 % - -  01/28/14 2230 120/75 mmHg - - 65 16 95 % - -  01/28/14 2215 134/80 mmHg - - 65 18 96 % - -  01/28/14 2200 135/68 mmHg - - 72 16 97 % - -  01/28/14 2147 127/65 mmHg - - 76 20 97 % - -  01/28/14 2145 127/65 mmHg - - 77 20 96 % - -  01/28/14 2130 109/61 mmHg - - 69 19 99 % - -   01/28/14 2115 107/64 mmHg - - 75 16 97 % - -  01/28/14 2102 105/63 mmHg 98 F (36.7 C) Oral 73 17 99 % - -  01/28/14 2100 105/63 mmHg - - 68 19 96 % - -  01/28/14 2053 102/62 mmHg 97.8 F (36.6 C) Oral 74 27 97 % 5\' 9"  (1.753 m) 175 lb (79.379 kg)  01/28/14 2041 - - - - - 98 % - -    12:12 AM  Reevaluation with update and discussion. After initial assessment and treatment, an updated evaluation reveals she still has mild chest heaviness. Findings discussed with patient and family members, all questions answered. Monya Kozakiewicz L   00:15- discussed with hospitalist, to arrange admission  Labs Review Labs Reviewed  CBC - Abnormal; Notable for the following:    MCH 35.1 (*)    All other components within normal limits  BASIC METABOLIC PANEL - Abnormal; Notable for the following:    GFR calc non Af Amer 78 (*)    All other components within normal limits  PRO B NATRIURETIC PEPTIDE  I-STAT TROPOININ, ED    Imaging Review Dg Chest Port 1 View  01/28/2014   CLINICAL DATA:  Mid sternal chest pain radiating to the left chest, left arm, mandible and neck. Shortness of breath. Smoker.  EXAM: PORTABLE CHEST - 1 VIEW  COMPARISON:  None.  FINDINGS: Borderline enlarged cardiac silhouette. Clear lungs. The lungs are hyperexpanded with prominent interstitial markings and mildly prominent pulmonary vasculature. Unremarkable bones.  IMPRESSION: Changes of COPD with borderline cardiomegaly, mild pulmonary vascular congestion and probable mild interstitial pulmonary edema.   Electronically Signed   By: Gordan PaymentSteve  Reid M.D.   On: 01/28/2014 21:51     EKG Interpretation   Date/Time:  Saturday January 28 2014 20:48:32 EDT Ventricular Rate:  70 PR Interval:  206 QRS Duration: 79 QT Interval:  408 QTC Calculation: 440 R Axis:   53 Text Interpretation:  Sinus rhythm Borderline prolonged PR interval Low  voltage, extremity and precordial leads No old tracing to compare  Confirmed by [facility]  MD, Ameerah Huffstetler  [phone]) on 01/28/2014 8:50:30 PM      MDM   Final diagnoses:  Chest pain, unspecified chest pain type    Nonspecific chest pain, low risk for acute coronary syndrome, with ongoing pain. Her pain is improved spontaneously. She does not have access to followup care  Nursing Notes Reviewed/ Care Coordinated, and agree without changes. Applicable Imaging Reviewed.  Interpretation of Laboratory Data incorporated into ED treatment  Plan: Admit  Flint MelterElliott L Gimena Buick, MD 01/29/14 [phone]

## 2014-01-29 ENCOUNTER — Encounter (HOSPITAL_COMMUNITY): Payer: Self-pay | Admitting: Internal Medicine

## 2014-01-29 DIAGNOSIS — R079 Chest pain, unspecified: Secondary | ICD-10-CM | POA: Diagnosis present

## 2014-01-29 DIAGNOSIS — I5022 Chronic systolic (congestive) heart failure: Secondary | ICD-10-CM | POA: Diagnosis present

## 2014-01-29 DIAGNOSIS — E039 Hypothyroidism, unspecified: Secondary | ICD-10-CM | POA: Diagnosis present

## 2014-01-29 DIAGNOSIS — I517 Cardiomegaly: Secondary | ICD-10-CM

## 2014-01-29 DIAGNOSIS — Z720 Tobacco use: Secondary | ICD-10-CM | POA: Diagnosis present

## 2014-01-29 DIAGNOSIS — E663 Overweight: Secondary | ICD-10-CM

## 2014-01-29 DIAGNOSIS — I10 Essential (primary) hypertension: Secondary | ICD-10-CM | POA: Diagnosis present

## 2014-01-29 LAB — CBC
HEMATOCRIT: 39.8 % (ref 36.0–46.0)
Hemoglobin: 13.7 g/dL (ref 12.0–15.0)
MCH: 33.4 pg (ref 26.0–34.0)
MCHC: 34.4 g/dL (ref 30.0–36.0)
MCV: 97.1 fL (ref 78.0–100.0)
PLATELETS: 198 10*3/uL (ref 150–400)
RBC: 4.1 MIL/uL (ref 3.87–5.11)
RDW: 13.4 % (ref 11.5–15.5)
WBC: 7.6 10*3/uL (ref 4.0–10.5)

## 2014-01-29 LAB — URINALYSIS, ROUTINE W REFLEX MICROSCOPIC
Bilirubin Urine: NEGATIVE
Glucose, UA: NEGATIVE mg/dL
Hgb urine dipstick: NEGATIVE
Ketones, ur: NEGATIVE mg/dL
Leukocytes, UA: NEGATIVE
Nitrite: NEGATIVE
Protein, ur: NEGATIVE mg/dL
Specific Gravity, Urine: 1.01 (ref 1.005–1.030)
Urobilinogen, UA: 0.2 mg/dL (ref 0.0–1.0)
pH: 5.5 (ref 5.0–8.0)

## 2014-01-29 LAB — RAPID URINE DRUG SCREEN, HOSP PERFORMED
Amphetamines: NOT DETECTED
Barbiturates: NOT DETECTED
Benzodiazepines: NOT DETECTED
Cocaine: NOT DETECTED
Opiates: NOT DETECTED
Tetrahydrocannabinol: NOT DETECTED

## 2014-01-29 LAB — CREATININE, SERUM
Creatinine, Ser: 0.72 mg/dL (ref 0.50–1.10)
GFR calc Af Amer: >90 mL/min (ref >90)

## 2014-01-29 LAB — TROPONIN I
Troponin I: <0.3 ng/mL (ref <0.30)
Troponin I: <0.3 ng/mL (ref <0.30)
Troponin I: <0.3 ng/mL (ref <0.30)

## 2014-01-29 LAB — LIPID PANEL
CHOLESTEROL: 233 mg/dL — AB (ref 0–200)
HDL: 36 mg/dL — AB (ref >39)
LDL Cholesterol: 148 mg/dL — ABNORMAL HIGH (ref 0–99)
Total CHOL/HDL Ratio: 6.5 RATIO
Triglycerides: 243 mg/dL — ABNORMAL HIGH (ref <150)
VLDL: 49 mg/dL — ABNORMAL HIGH (ref 0–40)

## 2014-01-29 MED ORDER — INFLUENZA VAC SPLIT QUAD 0.5 ML IM SUSY: 0.5000 mL | PREFILLED_SYRINGE | INTRAMUSCULAR | Status: DC

## 2014-01-29 MED ORDER — NAPHAZOLINE HCL 0.1 % OP SOLN
1.0000 [drp] | Freq: Four times a day (QID) | OPHTHALMIC | Status: DC | PRN
  Administered 2014-01-29: 1 [drp] via OPHTHALMIC
  Filled 2014-01-29 (×3): qty 15

## 2014-01-29 MED ORDER — ASPIRIN EC 325 MG PO TBEC
325.0000 mg | DELAYED_RELEASE_TABLET | Freq: Every day | ORAL | Status: DC
  Administered 2014-01-29: 325 mg via ORAL
  Filled 2014-01-29: qty 1

## 2014-01-29 MED ORDER — LEVOTHYROXINE SODIUM 100 MCG PO TABS
100.0000 ug | ORAL_TABLET | Freq: Every day | ORAL | Status: DC
  Administered 2014-01-29 – 2014-01-31 (×3): 100 ug via ORAL
  Filled 2014-01-29 (×4): qty 1

## 2014-01-29 MED ORDER — ALPRAZOLAM 0.25 MG PO TABS
0.2500 mg | ORAL_TABLET | Freq: Two times a day (BID) | ORAL | Status: DC | PRN
  Administered 2014-01-29 – 2014-01-31 (×5): 0.25 mg via ORAL
  Filled 2014-01-29 (×5): qty 1

## 2014-01-29 MED ORDER — ENOXAPARIN SODIUM 40 MG/0.4ML ~~LOC~~ SOLN
40.0000 mg | SUBCUTANEOUS | Status: DC
  Administered 2014-01-29: 40 mg via SUBCUTANEOUS
  Filled 2014-01-29 (×2): qty 0.4

## 2014-01-29 MED ORDER — OXYCODONE HCL 5 MG PO TABS
5.0000 mg | ORAL_TABLET | Freq: Four times a day (QID) | ORAL | Status: DC | PRN
  Administered 2014-01-29 – 2014-01-31 (×3): 5 mg via ORAL
  Filled 2014-01-29 (×3): qty 1

## 2014-01-29 MED ORDER — ACETAMINOPHEN 325 MG PO TABS
650.0000 mg | ORAL_TABLET | ORAL | Status: DC | PRN
  Administered 2014-01-29 – 2014-01-30 (×2): 650 mg via ORAL
  Filled 2014-01-29 (×2): qty 2

## 2014-01-29 MED ORDER — MORPHINE SULFATE 2 MG/ML IJ SOLN
2.0000 mg | INTRAMUSCULAR | Status: DC | PRN
  Administered 2014-01-29: 2 mg via INTRAVENOUS
  Filled 2014-01-29: qty 1

## 2014-01-29 MED ORDER — PRAVASTATIN SODIUM 40 MG PO TABS
40.0000 mg | ORAL_TABLET | Freq: Every evening | ORAL | Status: DC
  Administered 2014-01-29 – 2014-01-30 (×2): 40 mg via ORAL
  Filled 2014-01-29 (×4): qty 1

## 2014-01-29 MED ORDER — NICOTINE 21 MG/24HR TD PT24: 21.0000 mg | MEDICATED_PATCH | Freq: Every day | TRANSDERMAL | Status: DC

## 2014-01-29 MED ORDER — INFLUENZA VAC SPLIT QUAD 0.5 ML IM SUSY
0.5000 mL | PREFILLED_SYRINGE | INTRAMUSCULAR | Status: AC
  Administered 2014-01-30: 0.5 mL via INTRAMUSCULAR
  Filled 2014-01-29 (×2): qty 0.5

## 2014-01-29 MED ORDER — PANTOPRAZOLE SODIUM 40 MG PO TBEC
40.0000 mg | DELAYED_RELEASE_TABLET | Freq: Every day | ORAL | Status: DC
  Administered 2014-01-29 – 2014-01-30 (×2): 40 mg via ORAL
  Filled 2014-01-29 (×3): qty 1

## 2014-01-29 MED ORDER — FUROSEMIDE 20 MG PO TABS
20.0000 mg | ORAL_TABLET | Freq: Every day | ORAL | Status: DC
  Administered 2014-01-29 – 2014-01-31 (×2): 20 mg via ORAL
  Filled 2014-01-29 (×3): qty 1

## 2014-01-29 MED ORDER — ONDANSETRON HCL 4 MG/2ML IJ SOLN: 4.0000 mg | Freq: Four times a day (QID) | INTRAMUSCULAR | Status: DC | PRN

## 2014-01-29 MED ORDER — PNEUMOCOCCAL VAC POLYVALENT 25 MCG/0.5ML IJ INJ
0.5000 mL | INJECTION | INTRAMUSCULAR | Status: AC
  Administered 2014-01-30: 0.5 mL via INTRAMUSCULAR
  Filled 2014-01-29 (×2): qty 0.5

## 2014-01-29 MED ORDER — LIVING BETTER WITH HEART FAILURE BOOK
Freq: Once | Status: AC
  Administered 2014-01-29: 17:00:00

## 2014-01-29 MED ORDER — PNEUMOCOCCAL VAC POLYVALENT 25 MCG/0.5ML IJ INJ: 0.5000 mL | INJECTION | INTRAMUSCULAR | Status: DC

## 2014-01-29 MED ORDER — NICOTINE 21 MG/24HR TD PT24
21.0000 mg | MEDICATED_PATCH | Freq: Every day | TRANSDERMAL | Status: DC
  Administered 2014-01-29 – 2014-01-30 (×3): 21 mg via TRANSDERMAL
  Filled 2014-01-29 (×7): qty 1

## 2014-01-29 MED ORDER — LISINOPRIL 10 MG PO TABS
10.0000 mg | ORAL_TABLET | Freq: Every day | ORAL | Status: DC
  Administered 2014-01-29 – 2014-01-31 (×3): 10 mg via ORAL
  Filled 2014-01-29 (×3): qty 1

## 2014-01-29 NOTE — Progress Notes (Signed)
   Follow Up Note  Pt admitted earlier this morning.  Seen after arrived to floor.  Chest pain persisted, but mostly resolved, described as a dull pressure approximately 4/10  Exam: CV: Regular rate and rhythm, S1-S2, soft 2/6 systolic ejection murmur Lungs: Clear to auscultation bilaterally Abd: Soft, obese, nontender, positive bowel sounds Ext: Clubbing or cyanosis, trace edema  Present on Admission:  . Chest pain: Patient's heart score is 5. Enzymes x2 so far negative. We'll continue enzymes, n.p.o. after midnight and discuss with cardiology for possible stress test tomorrow. Given decreased ejection fraction he may have for more invasive measures  . Cardiomegaly: Echocardiogram checked.see below.  . Tobacco abuse: On nicotine patch, counseled to quit  . Hypertension: Continued on ACE inhibitor. Will start beta blocker on discharge  . Hypothyroidism: Continue Synthroid  . Overweight (BMI 25.0-29.9): Patient criteria BMI greater than 25  . Chronic systolic heart failure: Echocardiogram notes mildly decreased systolic ejection fraction of 45-40%40-45%.

## 2014-01-29 NOTE — H&P (Signed)
PCP: not local   Chief Complaint:  Chest pain  HPI: Diana Erickson is a 58 y.o. female   has a past medical history of Elevated cholesterol and Hypertension.   Presented with  Patient is here visiting her brother. She started to have chest pain last while sleeping. It felt like pressure and radiated to Left jaw and left arm. This was associated with nausea, no sweating, some shortness of breath. Non-pleuritic. This pain episode lasted 30 min. She denies ever having this before. When patient woke up she felt tired and weak, no energy. Her chest pain returned at rest around 2 pm. This episode was milder. Family called 911 and she was given 4 ASA and EMS gave nitro in route with good result. Currently she is chest pain free but still have pressure like sensation.  Patient has risk factors of tobacco abuse, hypercholesteremia, HTN,  Family hx of CAD mother and Sister who had CAD diagnosed in her 5840's.    Hospitalist was called for admission for Chest pain worrisome history.   Review of Systems:    Pertinent positives include:  Nausea, chest pain, shortness of breath at rest.   Constitutional:  No weight loss, night sweats, Fevers, chills, fatigue, weight loss  HEENT:  No headaches, Difficulty swallowing,Tooth/dental problems,Sore throat,  No sneezing, itching, ear ache, nasal congestion, post nasal drip,  Cardio-vascular:  No  Orthopnea, PND, anasarca, dizziness, palpitations.no Bilateral lower extremity swelling  GI:  No heartburn, indigestion, abdominal pain,  vomiting, diarrhea, change in bowel habits, loss of appetite, melena, blood in stool, hematemesis Resp:  no No dyspnea on exertion, No excess mucus, no productive cough, No non-productive cough, No coughing up of blood.No change in color of mucus.No wheezing. Skin:  no rash or lesions. No jaundice GU:  no dysuria, change in color of urine, no urgency or frequency. No straining to urinate.  No flank pain.    Musculoskeletal:  No joint pain or no joint swelling. No decreased range of motion. No back pain.  Psych:  No change in mood or affect. No depression or anxiety. No memory loss.  Neuro: no localizing neurological complaints, no tingling, no weakness, no double vision, no gait abnormality, no slurred speech, no confusion  Otherwise ROS are negative except for above, 10 systems were reviewed  Past Medical History: Past Medical History  Diagnosis Date  . Elevated cholesterol   . Hypertension    Past Surgical History  Procedure Laterality Date  . Appendectomy    . Tibia fracture surgery Right   . Cesarean section       Medications: Prior to Admission medications   Medication Sig Start Date End Date Taking? Authorizing Provider  furosemide (LASIX) 20 MG tablet Take 20 mg by mouth daily.   Yes Historical Provider, MD  levothyroxine (SYNTHROID, LEVOTHROID) 100 MCG tablet Take 100 mcg by mouth daily before breakfast.   Yes Historical Provider, MD  lisinopril (PRINIVIL,ZESTRIL) 10 MG tablet Take 10 mg by mouth daily.   Yes Historical Provider, MD  potassium chloride SA (K-DUR,KLOR-CON) 20 MEQ tablet Take 20 mEq by mouth daily.    Yes Historical Provider, MD  pravastatin (PRAVACHOL) 40 MG tablet Take 40 mg by mouth every evening.   Yes Historical Provider, MD    Allergies:  No Known Allergies  Social History:  Ambulatory   independently   Lives at home  With family     reports that she has been smoking.  She does not have any smokeless  tobacco history on file. She reports that she drinks alcohol. She reports that she does not use illicit drugs.    Family History: family history includes CAD in her mother; Cancer - Lung in her father and sister; Diabetes type II in her mother and sister.    Physical Exam: Patient Vitals for the past 24 hrs:  BP Temp Temp src Pulse Resp SpO2 Height Weight  01/29/14 0024 140/97 mmHg - - 84 15 98 % - -  01/29/14 0015 117/67 mmHg - - 63 15 97 %  - -  01/29/14 0000 124/67 mmHg - - 97 15 97 % - -  01/28/14 2345 135/74 mmHg - - 67 23 98 % - -  01/28/14 2330 136/62 mmHg - - 62 14 100 % - -  01/28/14 2300 102/68 mmHg - - 64 13 100 % - -  01/28/14 2245 120/75 mmHg - - 65 14 93 % - -  01/28/14 2230 120/75 mmHg - - 65 16 95 % - -  01/28/14 2215 134/80 mmHg - - 65 18 96 % - -  01/28/14 2200 135/68 mmHg - - 72 16 97 % - -  01/28/14 2147 127/65 mmHg - - 76 20 97 % - -  01/28/14 2145 127/65 mmHg - - 77 20 96 % - -  01/28/14 2130 109/61 mmHg - - 69 19 99 % - -  01/28/14 2115 107/64 mmHg - - 75 16 97 % - -  01/28/14 2102 105/63 mmHg 98 F (36.7 C) Oral 73 17 99 % - -  01/28/14 2100 105/63 mmHg - - 68 19 96 % - -  01/28/14 2053 102/62 mmHg 97.8 F (36.6 C) Oral 74 27 97 % 5\' 9"  (1.753 m) 79.379 kg (175 lb)  01/28/14 2041 - - - - - 98 % - -    1. General:  in No Acute distress 2. Psychological: Alert and   Oriented 3. Head/ENT:   Moist  Mucous Membranes                          Head Non traumatic, neck supple                          Normal   Dentition 4. SKIN: normal  kin turgor,  Skin clean Dry and intact no rash 5. Heart: Regular rate and rhythm no Murmur, Rub or gallop 6. Lungs:  Distant but no wheezes or crackles   7. Abdomen: Soft, non-tender, Non distended 8. Lower extremities: no clubbing, cyanosis, or edema 9. Neurologically Grossly intact, moving all 4 extremities equally 10. MSK: Normal range of motion  body mass index is 25.83 kg/(m^2).   Labs on Admission:   Recent Labs  01/28/14 2106  NA 140  K 4.0  CL 104  CO2 24  GLUCOSE 92  BUN 16  CREATININE 0.81  CALCIUM 9.3   No results found for this basename: AST, ALT, ALKPHOS, BILITOT, PROT, ALBUMIN,  in the last 72 hours No results found for this basename: LIPASE, AMYLASE,  in the last 72 hours  Recent Labs  01/28/14 2106  WBC 7.4  HGB 14.8  HCT 41.9  MCV 99.3  PLT 203   No results found for this basename: CKTOTAL, CKMB, CKMBINDEX, TROPONINI,  in the  last 72 hours No results found for this basename: TSH, T4TOTAL, FREET3, T3FREE, THYROIDAB,  in the last 72 hours No results  found for this basename: VITAMINB12, FOLATE, FERRITIN, TIBC, IRON, RETICCTPCT,  in the last 72 hours No results found for this basename: HGBA1C    Estimated Creatinine Clearance: 78.2 ml/min (by C-G formula based on Cr of 0.81). ABG No results found for this basename: phart, pco2, po2, hco3, tco2, acidbasedef, o2sat     No results found for this basename: DDIMER     Other results:  I have pearsonaly reviewed this: ECG REPORT  Rate:70  Rhythm: SR with prolonged PR interval ST&T Change: no significant ischemia   BNP (last 3 results)  Recent Labs  01/28/14 2106  PROBNP 42.1    Filed Weights   01/28/14 2053  Weight: 79.379 kg (175 lb)     Cultures: No results found for this basename: sdes, specrequest, cult, reptstatus     Radiological Exams on Admission: Dg Chest Port 1 View  01/28/2014   CLINICAL DATA:  Mid sternal chest pain radiating to the left chest, left arm, mandible and neck. Shortness of breath. Smoker.  EXAM: PORTABLE CHEST - 1 VIEW  COMPARISON:  None.  FINDINGS: Borderline enlarged cardiac silhouette. Clear lungs. The lungs are hyperexpanded with prominent interstitial markings and mildly prominent pulmonary vasculature. Unremarkable bones.  IMPRESSION: Changes of COPD with borderline cardiomegaly, mild pulmonary vascular congestion and probable mild interstitial pulmonary edema.   Electronically Signed   By: Gordan PaymentSteve  Reid M.D.   On: 01/28/2014 21:51    Chart has been reviewed  Assessment/Plan  58 yo F with risk factors of tobacco abuse, hypercholesteremia, HTN,  Family hx of CAD presents with chest pain with worrisome story, no ECG changes and normal troponin  Present on Admission:  . Chest pain - - given risk factors will admit, monitor on telemetry, cycle cardiac enzymes, obtain serial ECG. Further risk stratify with lipid panel,  hgA1C, obtain TSH. Make sure patient is on Aspirin. Further treatment based on the currently pending results.  . Cardiomegaly - will order echo to further evaluate . Tobacco abuse - tobacco cessation counceling . Hypertension  - continue home medications, stable   Prophylaxis:  Lovenox, Protonix  CODE STATUS:  FULL CODE   Other plan as per orders.  I have spent a total of  55 min on this admission  Tolulope Pinkett 01/29/2014, 12:34 AM  [facility]  Pager [phone]   after 2 AM please page floor coverage PA If 7AM-7PM, please contact the day team taking care of the patient  [email or web address]  Password [email or web address]

## 2014-01-29 NOTE — Progress Notes (Signed)
Utilization Review Completed.   Silviano Neuser, RN, BSN Nurse Case Manager  

## 2014-01-29 NOTE — Progress Notes (Signed)
Echocardiogram 2D Echocardiogram has been performed.  Dorothey BasemanReel, Mart Colpitts M 01/29/2014, 10:36 AM

## 2014-01-30 ENCOUNTER — Encounter (HOSPITAL_COMMUNITY)
Admission: EM | Disposition: A | Discharge status: Home / Self Care | Payer: Self-pay | Source: Home / Self Care | Attending: Internal Medicine

## 2014-01-30 DIAGNOSIS — Z720 Tobacco use: Secondary | ICD-10-CM

## 2014-01-30 DIAGNOSIS — I25110 Atherosclerotic heart disease of native coronary artery with unstable angina pectoris: Secondary | ICD-10-CM

## 2014-01-30 DIAGNOSIS — E039 Hypothyroidism, unspecified: Secondary | ICD-10-CM

## 2014-01-30 DIAGNOSIS — R079 Chest pain, unspecified: Secondary | ICD-10-CM

## 2014-01-30 DIAGNOSIS — I200 Unstable angina: Secondary | ICD-10-CM | POA: Diagnosis present

## 2014-01-30 DIAGNOSIS — I10 Essential (primary) hypertension: Secondary | ICD-10-CM

## 2014-01-30 HISTORY — PX: LEFT HEART CATHETERIZATION WITH CORONARY ANGIOGRAM: SHX5451

## 2014-01-30 LAB — URINE CULTURE
Colony Count: 5000
Special Requests: NORMAL

## 2014-01-30 LAB — PROTIME-INR
INR: 1 (ref 0.00–1.49)
PROTHROMBIN TIME: 13.3 s (ref 11.6–15.2)

## 2014-01-30 LAB — POCT ACTIVATED CLOTTING TIME
ACTIVATED CLOTTING TIME: 208 s
ACTIVATED CLOTTING TIME: 259 s
Activated Clotting Time: 219 seconds

## 2014-01-30 LAB — HEMOGLOBIN A1C
Hgb A1c MFr Bld: 5.7 % — ABNORMAL HIGH (ref <5.7)
Mean Plasma Glucose: 117 mg/dL — ABNORMAL HIGH (ref <117)

## 2014-01-30 SURGERY — LEFT HEART CATHETERIZATION WITH CORONARY ANGIOGRAM
Anesthesia: LOCAL

## 2014-01-30 MED ORDER — PRASUGREL HCL 10 MG PO TABS
ORAL_TABLET | ORAL | Status: AC
  Filled 2014-01-30: qty 1

## 2014-01-30 MED ORDER — ASPIRIN 81 MG PO CHEW
81.0000 mg | CHEWABLE_TABLET | ORAL | Status: AC
  Administered 2014-01-30: 81 mg via ORAL
  Filled 2014-01-30: qty 1

## 2014-01-30 MED ORDER — HEPARIN (PORCINE) IN NACL 2-0.9 UNIT/ML-% IJ SOLN
INTRAMUSCULAR | Status: AC
  Filled 2014-01-30: qty 1000

## 2014-01-30 MED ORDER — ALPRAZOLAM 0.25 MG PO TABS: 0.2500 mg | ORAL_TABLET | Freq: Two times a day (BID) | ORAL | Status: DC | PRN

## 2014-01-30 MED ORDER — ACETAMINOPHEN 325 MG PO TABS: 650.0000 mg | ORAL_TABLET | ORAL | Status: DC | PRN

## 2014-01-30 MED ORDER — ONDANSETRON HCL 4 MG/2ML IJ SOLN: 4.0000 mg | Freq: Four times a day (QID) | INTRAMUSCULAR | Status: DC | PRN

## 2014-01-30 MED ORDER — SODIUM CHLORIDE 0.9 % IV SOLN: INTRAVENOUS | Status: AC

## 2014-01-30 MED ORDER — SODIUM CHLORIDE 0.9 % IV SOLN: 250.0000 mL | INTRAVENOUS | Status: DC | PRN

## 2014-01-30 MED ORDER — SODIUM CHLORIDE 0.9 % IJ SOLN: 3.0000 mL | Freq: Two times a day (BID) | INTRAMUSCULAR | Status: DC

## 2014-01-30 MED ORDER — FENTANYL CITRATE 0.05 MG/ML IJ SOLN
INTRAMUSCULAR | Status: AC
  Filled 2014-01-30: qty 2

## 2014-01-30 MED ORDER — ACTIVE PARTNERSHIP FOR HEALTH OF YOUR HEART BOOK
Freq: Once | Status: AC
  Administered 2014-01-30: 23:00:00
  Filled 2014-01-30: qty 1

## 2014-01-30 MED ORDER — PRAVASTATIN SODIUM 80 MG PO TABS: 80.0000 mg | ORAL_TABLET | Freq: Every evening | ORAL | Status: DC

## 2014-01-30 MED ORDER — OXYCODONE HCL 5 MG PO TABS
5.0000 mg | ORAL_TABLET | Freq: Four times a day (QID) | ORAL | Status: DC | PRN
Start: 2014-01-30 — End: 2014-01-31

## 2014-01-30 MED ORDER — LISINOPRIL 10 MG PO TABS: 10.0000 mg | ORAL_TABLET | Freq: Every day | ORAL | Status: DC

## 2014-01-30 MED ORDER — VERAPAMIL HCL 2.5 MG/ML IV SOLN
INTRAVENOUS | Status: AC
  Filled 2014-01-30: qty 2

## 2014-01-30 MED ORDER — ASPIRIN 325 MG PO TBEC: 325.0000 mg | DELAYED_RELEASE_TABLET | Freq: Every day | ORAL | Status: DC

## 2014-01-30 MED ORDER — ASPIRIN 81 MG PO CHEW
81.0000 mg | CHEWABLE_TABLET | ORAL | Status: DC
Start: 2014-01-31 — End: 2014-01-30

## 2014-01-30 MED ORDER — SODIUM CHLORIDE 0.9 % IV SOLN
INTRAVENOUS | Status: DC
  Administered 2014-01-30: 11:00:00 via INTRAVENOUS

## 2014-01-30 MED ORDER — NITROGLYCERIN 1 MG/10 ML FOR IR/CATH LAB
INTRA_ARTERIAL | Status: AC
  Filled 2014-01-30: qty 10

## 2014-01-30 MED ORDER — ASPIRIN 81 MG PO CHEW
81.0000 mg | CHEWABLE_TABLET | Freq: Every day | ORAL | Status: DC
  Administered 2014-01-31: 11:00:00 81 mg via ORAL
  Filled 2014-01-30: qty 1

## 2014-01-30 MED ORDER — SODIUM CHLORIDE 0.9 % IJ SOLN: 3.0000 mL | INTRAMUSCULAR | Status: DC | PRN

## 2014-01-30 MED ORDER — ADENOSINE 12 MG/4ML IV SOLN
16.0000 mL | Freq: Once | INTRAVENOUS | Status: DC
  Filled 2014-01-30: qty 16

## 2014-01-30 MED ORDER — PRASUGREL HCL 10 MG PO TABS
10.0000 mg | ORAL_TABLET | Freq: Every day | ORAL | Status: DC
  Administered 2014-01-31: 10 mg via ORAL
  Filled 2014-01-30: qty 1

## 2014-01-30 MED ORDER — MIDAZOLAM HCL 2 MG/2ML IJ SOLN
INTRAMUSCULAR | Status: AC
  Filled 2014-01-30: qty 2

## 2014-01-30 MED ORDER — LIDOCAINE HCL (PF) 1 % IJ SOLN
INTRAMUSCULAR | Status: AC
  Filled 2014-01-30: qty 30

## 2014-01-30 MED ORDER — MORPHINE SULFATE 2 MG/ML IJ SOLN: 1.0000 mg | INTRAMUSCULAR | Status: DC | PRN

## 2014-01-30 MED ORDER — FUROSEMIDE 20 MG PO TABS: 20.0000 mg | ORAL_TABLET | Freq: Every day | ORAL | Status: DC

## 2014-01-30 MED ORDER — HEPARIN SODIUM (PORCINE) 1000 UNIT/ML IJ SOLN
INTRAMUSCULAR | Status: AC
  Filled 2014-01-30: qty 1

## 2014-01-30 NOTE — Interval H&P Note (Signed)
Cath Lab Visit (complete for each Cath Lab visit)  Clinical Evaluation Leading to the Procedure:   ACS: Yes.    Non-ACS:    Anginal Classification: CCS IV  Anti-ischemic medical therapy: No Therapy  Non-Invasive Test Results: No non-invasive testing performed  Prior CABG: No previous CABG      History and Physical Interval Note:  01/30/2014 5:20 PM  [name]  has presented today for surgery, with the diagnosis of cp  The various methods of treatment have been discussed with the patient and family. After consideration of risks, benefits and other options for treatment, the patient has consented to  Procedure(s): LEFT HEART CATHETERIZATION WITH CORONARY ANGIOGRAM (N/A) as a surgical intervention .  The patient's history has been reviewed, patient examined, no change in status, stable for surgery.  I have reviewed the patient's chart and labs.  Questions were answered to the patient's satisfaction.     Runell GessBERRY,Meily Glowacki J

## 2014-01-30 NOTE — Progress Notes (Signed)
PROGRESS NOTE  Diana Erickson VHQ:[id number] DOB: 1955/10/24 DOA: 01/28/2014 PCP: No PCP Per Patient  HPI/Recap of past 6724 hours: 58 year old white female past medical history of tobacco abuse, hypothyroidism and hypertension admitted on 10/18 for chest pain. Enzymes x3 negative. Echocardiogram done 10/18 noted chronic systolic heart failure with ejection fraction of 40%. For cardiac catheterization today.  Assessment/Plan: Principal Problem:   Chest pain: Enzymes x3 negative. For cardiac catheterization today Active Problems:   Cardiomegaly: Noted on chest x-ray, echocardiogram ordered.    Tobacco abuse: Counseled. On nicotine patch.    Hypertension: On ACE inhibitor   Hypothyroidism: Continue Synthroid.    Overweight (BMI 25.0-29.9): Patient. BMI greater than 25   Chronic systolic heart failure: Stable. BNP normal. Already on ACE inhibitor, resting heart rate in 60s   Code Status: full code  Family Communication: no family present  Disposition Plan: home tomorrow, cath results permitting   Consultants:  cardiology  Procedures:  Echocardiogram done 10/18: Ejection fraction 40%.  Cardiac catheterization for 10/19  Antibiotics:  None   Objective: BP 109/65  Pulse 63  Temp(Src) 98.3 F (36.8 C) (Oral)  Resp 20  Ht 5\' 9"  (1.753 m)  Wt 84.1 kg (185 lb 6.5 oz)  BMI 27.37 kg/m2  SpO2 98%  Intake/Output Summary (Last 24 hours) at 01/30/14 1835 Last data filed at 01/30/14 1300  Gross per 24 hour  Intake    360 ml  Output      0 ml  Net    360 ml   Filed Weights   01/29/14 0200 01/30/14 0452 01/30/14 0946  Weight: 84.188 kg (185 lb 9.6 oz) 84.1 kg (185 lb 6.5 oz) 84.1 kg (185 lb 6.5 oz)    Exam:   General:  Alert and oriented x3, no acute distress  Cardiovascular: Regular rate and rhythm, S1-S2  Respiratory: Clear to auscultation bilaterally  Abdomen: Soft, nontender, nondistended, positive bowel sounds  Musculoskeletal: No clubbing or cyanosis  or edema   Data Reviewed: Basic Metabolic Panel:  Recent Labs Lab 01/28/14 2106 01/29/14 0511  NA 140  --   K 4.0  --   CL 104  --   CO2 24  --   GLUCOSE 92  --   BUN 16  --   CREATININE 0.81 0.72  CALCIUM 9.3  --    Liver Function Tests: No results found for this basename: AST, ALT, ALKPHOS, BILITOT, PROT, ALBUMIN,  in the last 168 hours No results found for this basename: LIPASE, AMYLASE,  in the last 168 hours No results found for this basename: AMMONIA,  in the last 168 hours CBC:  Recent Labs Lab 01/28/14 2106 01/29/14 0511  WBC 7.4 7.6  HGB 14.8 13.7  HCT 41.9 39.8  MCV 99.3 97.1  PLT 203 198   Cardiac Enzymes:    Recent Labs Lab 01/29/14 0050 01/29/14 0650 01/29/14 1205  TROPONINI <0.30 <0.30 <0.30   BNP (last 3 results)  Recent Labs  01/28/14 2106  PROBNP 42.1   CBG: No results found for this basename: GLUCAP,  in the last 168 hours  Recent Results (from the past 240 hour(s))  URINE CULTURE     Status: None   Collection Time    01/29/14  1:13 AM      Result Value Ref Range Status   Specimen Description URINE, CLEAN CATCH   Final   Special Requests Normal   Final   Culture  Setup Time     Final   Value:  01/29/2014 15:44     [facility]formed at [facility]   Colony Count     Final   Value: 5,000 COLONIES/ML     [facility]formed at [facility]   Culture     Final   Value: INSIGNIFICANT GROWTH     Performed at [facility]   Report Status 01/30/2014 FINAL   Final     Studies: No results found.  Scheduled Meds: . adenosine  16 mL Intravenous Once  . [facility][MAR HOLD] aspirin EC  325 mg Oral Daily  . [MAR HOLD] enoxaparin (LOVENOX) injection  40 mg Subcutaneous Q24H  . [facility][MAR HOLD] furosemide  20 mg Oral Daily  . [facility][MAR HOLD] levothyroxine  100 mcg Oral QAC breakfast  . [MAR HOLD] lisinopril  10 mg Oral Daily  . [facility][MAR HOLD] nicotine  21 mg Transdermal Daily  . [MAR HOLD] pantoprazole  40 mg Oral Q1200  . [facility][MAR HOLD] pravastatin  40  mg Oral QPM  . sodium chloride  3 mL Intravenous Q12H    Continuous Infusions: . sodium chloride 50 mL/hr at 01/30/14 1036     Time spent: 15 minutes  Hollice EspyKRISHNAN,SENDIL K  [facility] Pager [phone]. If 7PM-7AM, please contact night-coverage at [email or web address], password [email or web address] 01/30/2014, 6:35 PM  LOS: 2 days

## 2014-01-30 NOTE — Consult Note (Signed)
Primary Physician: Primary Cardiologist:  NEw     HPI:  Patient is a 58 yo with history of HTN  Asked to see re CP Patient has no known CAD  Developed CP while asleep  When awake  Pressure  Rad to L jaw, L arm.  Noted nausea, some SOB  Lasted 30 min.  Was tired when woke up Recurred around 2 pm.    Again, radiated to L jaw, arm.  SOme SOB   Denies cough.  No F/C  Different from her reflux.  No recent overexertion.    Currently without symptoms.       Past Medical History  Diagnosis Date  . Elevated cholesterol   . Hypertension     Medications Prior to Admission  Medication Sig Dispense Refill  . furosemide (LASIX) 20 MG tablet Take 20 mg by mouth daily.      Marland Kitchen. levothyroxine (SYNTHROID, LEVOTHROID) 100 MCG tablet Take 100 mcg by mouth daily before breakfast.      . lisinopril (PRINIVIL,ZESTRIL) 10 MG tablet Take 10 mg by mouth daily.      . potassium chloride SA (K-DUR,KLOR-CON) 20 MEQ tablet Take 20 mEq by mouth daily.       . pravastatin (PRAVACHOL) 40 MG tablet Take 40 mg by mouth every evening.         Marland Kitchen. aspirin EC  325 mg Oral Daily  . enoxaparin (LOVENOX) injection  40 mg Subcutaneous Q24H  . furosemide  20 mg Oral Daily  . Influenza vac split quadrivalent PF  0.5 mL Intramuscular Tomorrow-1000  . levothyroxine  100 mcg Oral QAC breakfast  . lisinopril  10 mg Oral Daily  . nicotine  21 mg Transdermal Daily  . pantoprazole  40 mg Oral Q1200  . pneumococcal 23 valent vaccine  0.5 mL Intramuscular Tomorrow-1000  . pravastatin  40 mg Oral QPM    Infusions:    No Known Allergies  History   Social History  . Marital Status: Widowed    Spouse Name: N/A    Number of Children: N/A  . Years of Education: N/A   Occupational History  . Not on file.   Social History Main Topics  . Smoking status: Current Every Day Smoker  . Smokeless tobacco: Not on file  . Alcohol Use: Yes     Comment: occasional  . Drug Use: No  . Sexual Activity: Not on file   Other  Topics Concern  . Not on file   Social History Narrative  . No narrative on file    Family History  Problem Relation Age of Onset  . CAD Mother   . Diabetes type II Mother   . Cancer - Lung Father   . Diabetes type II Sister   . Cancer - Lung Sister     REVIEW OF SYSTEMS:  All systems reviewed  Negative to the above problem except as noted above.    PHYSICAL EXAM: Filed Vitals:   01/30/14 0452  BP: 99/56  Pulse: 57  Temp: 97.4 F (36.3 C)  Resp: 18     Intake/Output Summary (Last 24 hours) at 01/30/14 0827 Last data filed at 01/30/14 0600  Gross per 24 hour  Intake    360 ml  Output      0 ml  Net    360 ml    General:  Well appearing. No respiratory difficulty HEENT: normal Neck: supple. no JVD. Carotids 2+ bilat; no bruits. No lymphadenopathy or thryomegaly appreciated.  Cor: PMI nondisplaced. Regular rate & rhythm. No rubs, gallops or murmurs. Lungs: clear Chest:  Nontendeer.   Abdomen: soft, nontender, nondistended. No hepatosplenomegaly. No bruits or masses. Good bowel sounds. Extremities: no cyanosis, clubbing, rash, edema Neuro: alert & oriented x 3, cranial nerves grossly intact. moves all 4 extremities w/o difficulty. Affect pleasant.  ECG:  SR 70  First degree AV block  206 msec.    Results for orders placed during the hospital encounter of 01/28/14 (from the past 24 hour(s))  TROPONIN I     Status: None   Collection Time    01/29/14 12:05 PM      Result Value Ref Range   Troponin I <0.30  <0.30 ng/mL   Dg Chest Port 1 View  01/28/2014   CLINICAL DATA:  Mid sternal chest pain radiating to the left chest, left arm, mandible and neck. Shortness of breath. Smoker.  EXAM: PORTABLE CHEST - 1 VIEW  COMPARISON:  None.  FINDINGS: Borderline enlarged cardiac silhouette. Clear lungs. The lungs are hyperexpanded with prominent interstitial markings and mildly prominent pulmonary vasculature. Unremarkable bones.  IMPRESSION: Changes of COPD with borderline  cardiomegaly, mild pulmonary vascular congestion and probable mild interstitial pulmonary edema.   Electronically Signed   By: Steve  Reid M.D.   On: 01/28/2014 21:51     ASSESSMENT:  Patient is a 58 yo with a history of HTN and tobacco use  Also a family Hx of CAD Presents with CP and L jaw/arm discomfort.  Has r/o for MI Echo yesterday shows LVEF 45 to 50% Symptoms are concerning   Wilth LV dysfunction need to find out details of coronary anatomy. Would recomm L heart cath to define  Risks and benefits described  Patient understands and agrees to proceed.    2.  HTN  FOllow  3.  HL  LDL is increased 148  HDL 36  Trig 243.  Would check Hgb A1C.  Needs diet consult  May need statin.    4.  Tob  Counselled on importance of cessation.     

## 2014-01-30 NOTE — Care Management (Signed)
1133 01-30-14 [facility] Oct [facility] @ 9:00 am. Pt aware of appointment time. No further needs from CM identified at this time. Gala LewandowskyGraves-Bigelow, Estephan Gallardo Kaye, RN,BSN [phone]

## 2014-01-30 NOTE — Discharge Summary (Signed)
Discharge Summary  Talbert Cageatricia Erickson ZOX:[id number] DOB: 12-14-1955  PCP: No PCP Per Patient (patient will [facility])  Admit date: 01/28/2014 Discharge date: 01/31/2014  Time spent: 25 minutes  Recommendations for Outpatient Follow-up:  1. Patient followup the [facility] 2. Patient will [facility] w/ the [facility]. 3. Medication change: Patient's Pravachol is being increased to 80 mg daily 4. New medications: Aspirin 325 by mouth daily 5. New medication:Effient 10 mg by mouth daily  Discharge Diagnoses:  Active Hospital Problems   Diagnosis Date Noted  . Chest pain 01/29/2014  . Cardiomegaly 01/29/2014  . Tobacco abuse 01/29/2014  . Hypertension 01/29/2014  . Hypothyroidism 01/29/2014  . Overweight (BMI 25.0-29.9) 01/29/2014  . Chronic systolic heart failure 01/29/2014  Coronary artery disease status post PCI   Resolved Hospital Problems   Diagnosis Date Noted Date Resolved  No resolved problems to display.    Discharge Condition: Improved, being discharged home  Diet recommendation: Heart healthy  Filed Weights   01/29/14 0200 01/30/14 0452 01/30/14 0946  Weight: 84.188 kg (185 lb 9.6 oz) 84.1 kg (185 lb 6.5 oz) 84.1 kg (185 lb 6.5 oz)    History of present illness:  58 year old female with past medical history of tobacco abuse, hypertension and hyperlipidemia presented to the emergency room on 10/17 night complaining of chest pressure radiating to left jaw and left arm with some associated shortness of breath. Patient was brought in to the hospitalist service.  Hospital Course:  Principal Problem: Coronary artery disease presenting as chest pain status post PCI: Enzymes x3 negative. Echocardiogram noted mild decrease in ejection fraction at 40%. BNP normal. Cardiology consulted and patient went for cardiac catheterization 10/19 noting a 60% hypodense proximal OM 1 stenosis.  Patient then went successful intravenous  ultrasound-guided PCI stenting of that stenosis with a drug-eluting stent. She was then started on Effient &  aspirin.  Active Problems:   Cardiomegaly: Noted on chest x-ray. Echocardiogram notes mild chronic systolic heart failure. See below.    Tobacco abuse: Patient counseled to quit. 4 nicotine patch during hospitalization.    Hypertension: Continue Lasix and lisinopril.    Hypothyroidism: Continue Synthroid.    Overweight (BMI 25.0-29.9) : Patient meets criteria with . BMI greater than 25    Chronic systolic heart failure: BNP normal. Patient given information about heart failure. We'll have her followup with heart failure clinic. Already on ACE inhibitor. Resting heart rate and low 60s, so concerned about giving her Coreg.  Hyperlipidemia: Despite being on Pravachol 40 mg, LDL at 148. Pravachol increased to 80 mg daily  Procedures:  Echocardiogram done 10/18: Decreased ejection fraction of 40%  Cardiac catheterization done 10/19: 1. Left main; 20-30% ostial  2. LAD; minor irregularities  3. Left circumflex; 60% hypodense proximal OM1 stenosis. There was a moderate size ramus branch in addition..  4. Right coronary artery; dominant and free of significant disease 5. preserved ejection fraction of 60%  Consultations:  Cardiology  Discharge Exam: BP 112/65  Pulse 65  Temp(Src) 98.2 F (36.8 C) (Oral)  Resp 18  Ht 5\' 9"  (1.753 m)  Wt 84.1 kg (185 lb 6.5 oz)  BMI 27.37 kg/m2  SpO2 98%  General: Alert and oriented x3, no acute distress Cardiovascular: Regular rate and rhythm, S1-S2, soft 2/6 systolic ejection murmur Respiratory: Clear to auscultation bilaterally  Discharge Instructions You were cared for by a hospitalist during your hospital stay. If you have any questions about your discharge medications or the care  you received while you were in the hospital after you are discharged, you can call the unit and asked to speak with the hospitalist on call if the  hospitalist that took care of you is not available. Once you are discharged, your primary care physician will handle any further medical issues. Please note that NO REFILLS for any discharge medications will be authorized once you are discharged, as it is imperative that you return to your primary care physician (or establish a relationship with a primary care physician if you do not have one) for your aftercare needs so that they can reassess your need for medications and monitor your lab values.     Medication List         ALPRAZolam 0.25 MG tablet  Commonly known as:  XANAX  Take 1 tablet (0.25 mg total) by mouth 2 (two) times daily as needed for anxiety.     aspirin 325 MG EC tablet  Take 1 tablet (325 mg total) by mouth daily.     furosemide 20 MG tablet  Commonly known as:  LASIX  Take 1 tablet (20 mg total) by mouth daily.     levothyroxine 100 MCG tablet  Commonly known as:  SYNTHROID, LEVOTHROID  Take 100 mcg by mouth daily before breakfast.     lisinopril 10 MG tablet  Commonly known as:  PRINIVIL,ZESTRIL  Take 1 tablet (10 mg total) by mouth daily.     oxyCODONE 5 MG immediate release tablet  Commonly known as:  Oxy IR/ROXICODONE  Take 1 tablet (5 mg total) by mouth every 6 (six) hours as needed for severe pain.     potassium chloride SA 20 MEQ tablet  Commonly known as:  K-DUR,KLOR-CON  Take 20 mEq by mouth daily.     prasugrel 10 MG Tabs tablet  Commonly known as:  EFFIENT  Take 1 tablet (10 mg total) by mouth daily.     pravastatin 80 MG tablet  Commonly known as:  PRAVACHOL  Take 1 tablet (80 mg total) by mouth every evening.       No Known Allergies     Follow-up Information   Follow up with [facility]    .   Contact information:   [address] [place] [id number] [phone]      Follow up On 02/01/2014. [facility](Hospital Follow up- please bring discharges summary to visit. )        The results of  significant diagnostics from this hospitalization (including imaging, microbiology, ancillary and laboratory) are listed below for reference.    Significant Diagnostic Studies: Dg Chest Port 1 View  01/28/2014   CLINICAL DATA:  Mid sternal chest pain radiating to the left chest, left arm, mandible and neck. Shortness of breath. Smoker.  EXAM: PORTABLE CHEST - 1 VIEW  COMPARISON:  None.  FINDINGS: Borderline enlarged cardiac silhouette. Clear lungs. The lungs are hyperexpanded with prominent interstitial markings and mildly prominent pulmonary vasculature. Unremarkable bones.  IMPRESSION: Changes of COPD with borderline cardiomegaly, mild pulmonary vascular congestion and probable mild interstitial pulmonary edema.   Electronically Signed   By: Gordan PaymentSteve  Reid M.D.   On: 01/28/2014 21:51    Microbiology: Recent Results (from the past 240 hour(s))  URINE CULTURE     Status: None   Collection Time    01/29/14  1:13 AM      Result Value Ref Range Status   Specimen Description URINE, CLEAN CATCH   Final  Special Requests Normal   Final   Culture  Setup Time     Final   Value: 01/29/2014 15:44     Performed at [facility]   Colony Count     Final   Value: 5,000 COLONIES/ML     Performed at [facility]   Culture     Final   Value: INSIGNIFICANT GROWTH     Performed at [facility]   Report Status 01/30/2014 FINAL   Final     Labs: Basic Metabolic Panel:  Recent Labs Lab 01/28/14 2106 01/29/14 0511  NA 140  --   K 4.0  --   CL 104  --   CO2 24  --   GLUCOSE 92  --   BUN 16  --   CREATININE 0.81 0.72  CALCIUM 9.3  --    Liver Function Tests: No results found for this basename: AST, ALT, ALKPHOS, BILITOT, PROT, ALBUMIN,  in the last 168 hours No results found for this basename: LIPASE, AMYLASE,  in the last 168 hours No results found for this basename: AMMONIA,  in the last 168 hours CBC:  Recent Labs Lab 01/28/14 2106 01/29/14 0511  WBC 7.4 7.6    HGB 14.8 13.7  HCT 41.9 39.8  MCV 99.3 97.1  PLT 203 198   Cardiac Enzymes:  Recent Labs Lab 01/29/14 0050 01/29/14 0650 01/29/14 1205  TROPONINI <0.30 <0.30 <0.30   BNP: BNP (last 3 results)  Recent Labs  01/28/14 2106  PROBNP 42.1   CBG: No results found for this basename: GLUCAP,  in the last 168 hours     Signed:  Hollice Espy  [facility] 01/31/2014, 12:13 PM

## 2014-01-30 NOTE — CV Procedure (Signed)
Diana Erickson is a 58 y.o. female    [id number] LOCATION:  FACILITY: [facility]  PHYSICIAN: Nanetta BattyJonathan Rubert Frediani, M.D. [birth year]   DATE OF PROCEDURE:  01/30/2014  DATE OF DISCHARGE:     CARDIAC CATHETERIZATION     History obtained from chart review.[name] is a 58 year old married Caucasian female admitted with unstable angina. She has positive risk factors. Her EKG showed no acute changes. She ruled out for myocardial infarction by isoenzymes. Her 2-D echo showed mild LV dysfunction. She presents now for diagnostic coronary arteriography to define her anatomy and rule out an ischemic etiology.   PROCEDURE DESCRIPTION:   The patient was brought to the second floor [facility] cath lab in the postabsorptive state. She was premedicated with Valium 5 mg by mouth, IV Versed and fentanyl. Her right wristwas prepped and shaved in usual sterile fashion. Xylocaine 1% was used for local anesthesia. A 6 French sheath was inserted into the right radial artery using standard Seldinger technique. The patient received 4000 units  of heparin  intravenously.  A 5 [place] TIG catheter and pigtail catheters were used for selective coronary angiography and left ventriculography respectively. Omnipaque dye was used for the entirety of the case. Retrograde  aortic, left ventricular and pullback pressures were recorded.    HEMODYNAMICS:    AO SYSTOLIC/AO DIASTOLIC: 112/59   LV SYSTOLIC/LV DIASTOLIC: 112/11  ANGIOGRAPHIC RESULTS:   1. Left main; 20-30% ostial  2. LAD; minor irregularities 3. Left circumflex; 60% hypodense proximal OM1 stenosis. There was a moderate size ramus branch in addition..  4. Right coronary artery; dominant and free of significant disease 5. Left ventriculography; RAO left ventriculogram was performed using  25 mL of Visipaque dye at 12 mL/second. The overall LVEF estimated  60 % Without wall motion abnormalities  IMPRESSION:Diana Erickson has a intermediate in severity  first obtuse marginal branch stenosis. I'm going to proceed with IVUS imaging since FFR is not available today. The remainder of according to him he is free of significant disease and she has normal LV function.   Procedure description: using an XB 3.5 cm guide catheter along with an 014/190 cm long BMW guidewire the obtuse marginal branch was crossed. The patient received a total of 9000 units of heparin intravenously with an ACT of 259. Total contrast used during the case was 160 cc administered to the patient. Intravascular ultrasound performed revealing a distal reference segment of 2.3 x 2.3 mm. The sectional area at the site of tightest narrowing was 2.37 mm with a large amount of eccentric soft plaque. Based on this we'll decide to proceed with intervention. I loaded her with 60 mg of Effient. Following this I predilated with a 2 mm x 15 mm long Angiosculpt  balloon at nominal pressures . I then deployed a 2.5 mm x 18 mm long Xience Alpine  drug-eluting stent at 14 atmospheres (2.37 mm) result in reduction of a 60% hypodense proximal OM1 branch stenosis to 0% residual. The patient tolerated the procedure well without electrocardiographic or hemodynamic sequela. The final angiographic result with reduction of a 60% hypodense first obtuse marginal branch stenosis to 0% residual with TIMI-3 flow. The guide catheter was then removed. The sheath was removed and a TR Band was placed on the right wrist to achieve hemostasis. The patient left the Cath Lab in stable condition.  Overall impression: Successful IVUS guided PCI stenting of a significant first obtuse branch stenosis with drug-eluting stent, Effient and aspirin. The patient tolerated the procedure well.  She'll be discharged home tomorrow.   Diana Erickson,Diana Fickett J. MD, [facility] 01/30/2014 6:47 PM

## 2014-01-30 NOTE — Progress Notes (Signed)
UR completed 

## 2014-01-30 NOTE — H&P (View-Only) (Signed)
Primary Physician: Primary Cardiologist:  NEw     HPI:  Patient is a 58 yo with history of HTN  Asked to see re CP Patient has no known CAD  Developed CP while asleep  When awake  Pressure  Rad to L jaw, L arm.  Noted nausea, some SOB  Lasted 30 min.  Was tired when woke up Recurred around 2 pm.    Again, radiated to L jaw, arm.  SOme SOB   Denies cough.  No F/C  Different from her reflux.  No recent overexertion.    Currently without symptoms.       Past Medical History  Diagnosis Date  . Elevated cholesterol   . Hypertension     Medications Prior to Admission  Medication Sig Dispense Refill  . furosemide (LASIX) 20 MG tablet Take 20 mg by mouth daily.      Marland Kitchen. levothyroxine (SYNTHROID, LEVOTHROID) 100 MCG tablet Take 100 mcg by mouth daily before breakfast.      . lisinopril (PRINIVIL,ZESTRIL) 10 MG tablet Take 10 mg by mouth daily.      . potassium chloride SA (K-DUR,KLOR-CON) 20 MEQ tablet Take 20 mEq by mouth daily.       . pravastatin (PRAVACHOL) 40 MG tablet Take 40 mg by mouth every evening.         Marland Kitchen. aspirin EC  325 mg Oral Daily  . enoxaparin (LOVENOX) injection  40 mg Subcutaneous Q24H  . furosemide  20 mg Oral Daily  . Influenza vac split quadrivalent PF  0.5 mL Intramuscular Tomorrow-1000  . levothyroxine  100 mcg Oral QAC breakfast  . lisinopril  10 mg Oral Daily  . nicotine  21 mg Transdermal Daily  . pantoprazole  40 mg Oral Q1200  . pneumococcal 23 valent vaccine  0.5 mL Intramuscular Tomorrow-1000  . pravastatin  40 mg Oral QPM    Infusions:    No Known Allergies  History   Social History  . Marital Status: Widowed    Spouse Name: N/A    Number of Children: N/A  . Years of Education: N/A   Occupational History  . Not on file.   Social History Main Topics  . Smoking status: Current Every Day Smoker  . Smokeless tobacco: Not on file  . Alcohol Use: Yes     Comment: occasional  . Drug Use: No  . Sexual Activity: Not on file   Other  Topics Concern  . Not on file   Social History Narrative  . No narrative on file    Family History  Problem Relation Age of Onset  . CAD Mother   . Diabetes type II Mother   . Cancer - Lung Father   . Diabetes type II Sister   . Cancer - Lung Sister     REVIEW OF SYSTEMS:  All systems reviewed  Negative to the above problem except as noted above.    PHYSICAL EXAM: Filed Vitals:   01/30/14 0452  BP: 99/56  Pulse: 57  Temp: 97.4 F (36.3 C)  Resp: 18     Intake/Output Summary (Last 24 hours) at 01/30/14 0827 Last data filed at 01/30/14 0600  Gross per 24 hour  Intake    360 ml  Output      0 ml  Net    360 ml    General:  Well appearing. No respiratory difficulty HEENT: normal Neck: supple. no JVD. Carotids 2+ bilat; no bruits. No lymphadenopathy or thryomegaly appreciated.  Cor: PMI nondisplaced. Regular rate & rhythm. No rubs, gallops or murmurs. Lungs: clear Chest:  Nontendeer.   Abdomen: soft, nontender, nondistended. No hepatosplenomegaly. No bruits or masses. Good bowel sounds. Extremities: no cyanosis, clubbing, rash, edema Neuro: alert & oriented x 3, cranial nerves grossly intact. moves all 4 extremities w/o difficulty. Affect pleasant.  ECG:  SR 70  First degree AV block  206 msec.    Results for orders placed during the hospital encounter of 01/28/14 (from the past 24 hour(s))  TROPONIN I     Status: None   Collection Time    01/29/14 12:05 PM      Result Value Ref Range   Troponin I <0.30  <0.30 ng/mL   Dg Chest Port 1 View  01/28/2014   CLINICAL DATA:  Mid sternal chest pain radiating to the left chest, left arm, mandible and neck. Shortness of breath. Smoker.  EXAM: PORTABLE CHEST - 1 VIEW  COMPARISON:  None.  FINDINGS: Borderline enlarged cardiac silhouette. Clear lungs. The lungs are hyperexpanded with prominent interstitial markings and mildly prominent pulmonary vasculature. Unremarkable bones.  IMPRESSION: Changes of COPD with borderline  cardiomegaly, mild pulmonary vascular congestion and probable mild interstitial pulmonary edema.   Electronically Signed   By: Gordan PaymentSteve  Reid M.D.   On: 01/28/2014 21:51     ASSESSMENT:  Patient is a 58 yo with a history of HTN and tobacco use  Also a family Hx of CAD Presents with CP and L jaw/arm discomfort.  Has r/o for MI Echo yesterday shows LVEF 45 to 50% Symptoms are concerning   Wilth LV dysfunction need to find out details of coronary anatomy. Would recomm L heart cath to define  Risks and benefits described  Patient understands and agrees to proceed.    2.  HTN  FOllow  3.  HL  LDL is increased 148  HDL 36  Trig 243.  Would check Hgb A1C.  Needs diet consult  May need statin.    4.  Tob  Counselled on importance of cessation.

## 2014-01-30 NOTE — Plan of Care (Addendum)
Problem: Food- and Nutrition-Related Knowledge Deficit (NB-1.1) Goal: Nutrition education Formal process to instruct or train a patient/client in a skill or to impart knowledge to help patients/clients voluntarily manage or modify food choices and eating behavior to maintain or improve health. Outcome: Completed/Met Date Met:  01/30/14 Nutrition Education Note  RD consulted for nutrition education regarding a Heart Healthy diet. Patient with elevated lipids as below.  Lipid Panel     Component Value Date/Time    CHOL 233* 01/29/2014 0511    TRIG 243* 01/29/2014 0511    HDL 36* 01/29/2014 0511    CHOLHDL 6.5 01/29/2014 0511    VLDL 49* 01/29/2014 0511    LDLCALC 148* 01/29/2014 0511    RD provided "Heart Healthy Nutrition Therapy" handout from the Academy of Nutrition and Dietetics. Reviewed patient's dietary recall. Provided examples on ways to decrease sodium and fat intake in diet. Discouraged intake of processed foods and use of salt shaker. Encouraged fresh fruits and vegetables as well as whole grain sources of carbohydrates to maximize fiber intake. Teach back method used.  Expect good compliance.  Body mass index is 27.37 kg/(m^2). Pt meets criteria for overweight based on current BMI.  Current diet order is NPO for procedure. Labs and medications reviewed. No further nutrition interventions warranted at this time. RD contact information provided. If additional nutrition issues arise, please re-consult RD.  Molli Barrows, RD, LDN, [place] Pager [phone] After Hours Pager [phone]

## 2014-01-31 DIAGNOSIS — Z8249 Family history of ischemic heart disease and other diseases of the circulatory system: Secondary | ICD-10-CM | POA: Diagnosis not present

## 2014-01-31 DIAGNOSIS — Z716 Tobacco abuse counseling: Secondary | ICD-10-CM | POA: Diagnosis not present

## 2014-01-31 DIAGNOSIS — R0789 Other chest pain: Secondary | ICD-10-CM | POA: Diagnosis not present

## 2014-01-31 DIAGNOSIS — I517 Cardiomegaly: Secondary | ICD-10-CM | POA: Diagnosis present

## 2014-01-31 DIAGNOSIS — E663 Overweight: Secondary | ICD-10-CM | POA: Diagnosis present

## 2014-01-31 DIAGNOSIS — E780 Pure hypercholesterolemia: Secondary | ICD-10-CM | POA: Diagnosis present

## 2014-01-31 DIAGNOSIS — I2510 Atherosclerotic heart disease of native coronary artery without angina pectoris: Secondary | ICD-10-CM | POA: Diagnosis present

## 2014-01-31 DIAGNOSIS — I10 Essential (primary) hypertension: Secondary | ICD-10-CM | POA: Diagnosis present

## 2014-01-31 DIAGNOSIS — E039 Hypothyroidism, unspecified: Secondary | ICD-10-CM | POA: Diagnosis present

## 2014-01-31 DIAGNOSIS — Z23 Encounter for immunization: Secondary | ICD-10-CM | POA: Diagnosis not present

## 2014-01-31 DIAGNOSIS — Z79899 Other long term (current) drug therapy: Secondary | ICD-10-CM | POA: Diagnosis not present

## 2014-01-31 DIAGNOSIS — I5022 Chronic systolic (congestive) heart failure: Secondary | ICD-10-CM | POA: Diagnosis present

## 2014-01-31 DIAGNOSIS — F17210 Nicotine dependence, cigarettes, uncomplicated: Secondary | ICD-10-CM | POA: Diagnosis present

## 2014-01-31 DIAGNOSIS — Z6825 Body mass index (BMI) 25.0-25.9, adult: Secondary | ICD-10-CM | POA: Diagnosis not present

## 2014-01-31 LAB — BASIC METABOLIC PANEL
ANION GAP: 12 (ref 5–15)
BUN: 14 mg/dL (ref 6–23)
CALCIUM: 8.8 mg/dL (ref 8.4–10.5)
CO2: 25 mEq/L (ref 19–32)
Chloride: 104 mEq/L (ref 96–112)
Creatinine, Ser: 0.84 mg/dL (ref 0.50–1.10)
GFR, EST AFRICAN AMERICAN: 86 mL/min — AB (ref >90)
GFR, EST NON AFRICAN AMERICAN: 75 mL/min — AB (ref >90)
Glucose, Bld: 92 mg/dL (ref 70–99)
POTASSIUM: 4.1 meq/L (ref 3.7–5.3)
SODIUM: 141 meq/L (ref 137–147)

## 2014-01-31 LAB — CBC
HCT: 38.1 % (ref 36.0–46.0)
Hemoglobin: 12.9 g/dL (ref 12.0–15.0)
MCH: 33.9 pg (ref 26.0–34.0)
MCHC: 33.9 g/dL (ref 30.0–36.0)
MCV: 100.3 fL — ABNORMAL HIGH (ref 78.0–100.0)
PLATELETS: 171 10*3/uL (ref 150–400)
RBC: 3.8 MIL/uL — AB (ref 3.87–5.11)
RDW: 13.3 % (ref 11.5–15.5)
WBC: 6.9 10*3/uL (ref 4.0–10.5)

## 2014-01-31 MED ORDER — PRASUGREL HCL 10 MG PO TABS: 10.0000 mg | ORAL_TABLET | Freq: Every day | ORAL | Status: DC

## 2014-01-31 MED ORDER — OXYCODONE HCL 5 MG PO TABS: 5.0000 mg | ORAL_TABLET | Freq: Four times a day (QID) | ORAL | Status: DC | PRN

## 2014-01-31 MED ORDER — ALPRAZOLAM 0.25 MG PO TABS: 0.2500 mg | ORAL_TABLET | Freq: Two times a day (BID) | ORAL | Status: DC | PRN

## 2014-01-31 MED ORDER — PRAVASTATIN SODIUM 80 MG PO TABS: 80.0000 mg | ORAL_TABLET | Freq: Every evening | ORAL | Status: DC

## 2014-01-31 MED ORDER — ONDANSETRON HCL 4 MG PO TABS
4.0000 mg | ORAL_TABLET | Freq: Once | ORAL | Status: AC
  Administered 2014-01-31: 4 mg via ORAL
  Filled 2014-01-31 (×2): qty 1

## 2014-01-31 MED ORDER — MAGNESIUM HYDROXIDE 400 MG/5ML PO SUSP: 15.0000 mL | Freq: Every day | ORAL | Status: DC | PRN

## 2014-01-31 NOTE — Progress Notes (Signed)
SUBJECTIVE:  No chest pain.  No SOB   PHYSICAL EXAM Filed Vitals:   01/31/14 0011 01/31/14 0105 01/31/14 0425 01/31/14 0731  BP: 95/59  113/62 104/53  Pulse: 54  60 63  Temp: 97.4 F (36.3 C)  97.4 F (36.3 C) 97.7 F (36.5 C)  TempSrc: Oral  Oral Oral  Resp: 18  20   Height:      Weight:  185 lb 6.5 oz (84.1 kg)    SpO2: 99%  98% 98%   General:  No distress Lungs:  Clear Heart:  RRR Abdomen:  Positive bowel sounds, no rebound no guarding Extremities:  Right wrist without erythema or echymosis  LABS:  Results for orders placed during the hospital encounter of 01/28/14 (from the past 24 hour(s))  HEMOGLOBIN A1C     Status: Abnormal   Collection Time    01/30/14  9:48 AM      Result Value Ref Range   Hemoglobin A1C 5.7 (*) <5.7 %   Mean Plasma Glucose 117 (*) <117 mg/dL  PROTIME-INR     Status: None   Collection Time    01/30/14 11:36 AM      Result Value Ref Range   Prothrombin Time 13.3  11.6 - 15.2 seconds   INR 1.00  0.00 - 1.49  POCT ACTIVATED CLOTTING TIME     Status: None   Collection Time    01/30/14  5:40 PM      Result Value Ref Range   Activated Clotting Time 208    POCT ACTIVATED CLOTTING TIME     Status: None   Collection Time    01/30/14  5:52 PM      Result Value Ref Range   Activated Clotting Time 259    POCT ACTIVATED CLOTTING TIME     Status: None   Collection Time    01/30/14  6:17 PM      Result Value Ref Range   Activated Clotting Time 219    BASIC METABOLIC PANEL     Status: Abnormal   Collection Time    01/31/14  4:04 AM      Result Value Ref Range   Sodium 141  137 - 147 mEq/L   Potassium 4.1  3.7 - 5.3 mEq/L   Chloride 104  96 - 112 mEq/L   CO2 25  19 - 32 mEq/L   Glucose, Bld 92  70 - 99 mg/dL   BUN 14  6 - 23 mg/dL   Creatinine, Ser 7.820.84  0.50 - 1.10 mg/dL   Calcium 8.8  8.4 - 95.610.5 mg/dL   GFR calc non Af Amer 75 (*) >90 mL/min   GFR calc Af Amer 86 (*) >90 mL/min   Anion gap 12  5 - 15  CBC     Status: Abnormal   Collection Time    01/31/14  4:04 AM      Result Value Ref Range   WBC 6.9  4.0 - 10.5 K/uL   RBC 3.80 (*) 3.87 - 5.11 MIL/uL   Hemoglobin 12.9  12.0 - 15.0 g/dL   HCT 21.338.1  08.636.0 - 57.846.0 %   MCV 100.3 (*) 78.0 - 100.0 fL   MCH 33.9  26.0 - 34.0 pg   MCHC 33.9  30.0 - 36.0 g/dL   RDW 46.913.3  62.911.5 - 52.815.5 %   Platelets 171  150 - 400 K/uL    Intake/Output Summary (Last 24 hours) at 01/31/14 0930 Last data filed  at 01/31/14 0426  Gross per 24 hour  Intake 1042.5 ml  Output    900 ml  Net  142.5 ml   EKG:  NSR, rate 60, no acute ST T wave changes. Low voltage chest leads with poor anterior R wave progression.  No change from previous.  01/31/2014  ASSESSMENT AND PLAN:  CAD:  PCI/DSE of OM.  DAPT x 1 year.  We will arrange follow up.   Agree with discharge meds as on MAR.  Discussed the importance of DAPT.    TOBACCO ABUSE:  Educated.    Diana FearingJames Keiaira Donlan 01/31/2014 9:30 AM

## 2014-01-31 NOTE — Care Management Note (Addendum)
  Page 2 of 2   01/31/2014     2:36:44 PM CARE MANAGEMENT NOTE 01/31/2014  Patient:  Diana Erickson,Diana Erickson   Account Number:  [id number]  Date Initiated:  01/31/2014  Documentation initiated by:  Kellon Chalk  Subjective/Objective Assessment:   Chest Pain     Action/Plan:   CM to follow for disposition needs   Anticipated DC Date:  01/31/2014   Anticipated DC Plan:  HOME/SELF CARE      DC Planning Services  CM consult  Medication Assistance  Follow-up appt scheduled  [facility]  Other  PCP issues      [facility]  NA   Choice offered to / List presented to:  NA           Status of service:  Completed, signed off Medicare Important Message given?   (If response is "NO", the following Medicare IM given date fields will be blank) Date Medicare IM given:   Medicare IM given by:   Date Additional Medicare IM given:   Additional Medicare IM given by:    Discharge Disposition:  HOME/SELF CARE  Per UR Regulation:  Reviewed for med. necessity/level of care/duration of stay  If discussed at Long Length of Stay Meetings, dates discussed:    Comments:  Naiyana Barbian RN, BSN, MSHL, CCM  Nurse - Case Manager,  (Unit [phone])  [phone]  01/31/2014 Social:  Patient from home living with brother in [place] at this time for a few months.  [place]  Patient has applied for [facility] but did not qualify based on [place] elligibility.   Patient plans to d/c back to brothers home. PCP:  NONE -  [facility]01/2014 at 9:am. Med Review:  Effient MED Assistance:  CM provided VE on $4.00 med list and provided patient with hard copy CM provided Effient Medication Application form: instructed to complete and take to MD appt in am for MD to finalize and fax to [employer]. CM provided education on medication asssitance [facility] and request assistance on appt in the am. CM provided Effient 30 day free card during  the interim of application processing. Dispo:  Home / Self care.

## 2014-01-31 NOTE — Progress Notes (Signed)
CARDIAC REHAB PHASE I   PRE:  Rate/Rhythm: 64 SR    BP: sitting 104/53    SaO2:   MODE:  Ambulation: 700 ft   POST:  Rate/Rhythm: 74 SR    BP: sitting 104/66     SaO2:   Tolerated well. Pt does st she feels SOB getting OOB. Sts this doesn't seem as bad while walking. VSS. Ed completed, very receptive and wants to quit smoking. Gave smoking cessation resources and fake cigarette. Discussed patches and a quitting method. Pt interested in CRPII and pt [facility]   [phone]  Harriet Massoneeve, Chanise Habeck Kristan CES, ACSM 01/31/2014 8:29 AM

## 2014-01-31 NOTE — Progress Notes (Signed)
TR BAND REMOVAL  LOCATION:    right radial  DEFLATED PER PROTOCOL:    Yes.    TIME BAND OFF / DRESSING APPLIED:    22:45   SITE UPON ARRIVAL:    Level 0  SITE AFTER BAND REMOVAL:    Level 0  REVERSE ALLEN'S TEST:     positive  CIRCULATION SENSATION AND MOVEMENT:    Within Normal Limits   Yes.    COMMENTS:

## 2014-02-02 ENCOUNTER — Ambulatory Visit: Payer: Medicaid Other | Attending: Family Medicine | Admitting: Family Medicine

## 2014-02-02 ENCOUNTER — Encounter: Payer: Self-pay | Admitting: Family Medicine

## 2014-02-02 VITALS — BP 109/76 | HR 80 | Temp 98.4°F | Resp 18 | Ht 69.0 in | Wt 185.0 lb

## 2014-02-02 DIAGNOSIS — E039 Hypothyroidism, unspecified: Secondary | ICD-10-CM | POA: Diagnosis not present

## 2014-02-02 DIAGNOSIS — I5022 Chronic systolic (congestive) heart failure: Secondary | ICD-10-CM

## 2014-02-02 DIAGNOSIS — Z716 Tobacco abuse counseling: Secondary | ICD-10-CM | POA: Diagnosis not present

## 2014-02-02 DIAGNOSIS — F411 Generalized anxiety disorder: Secondary | ICD-10-CM | POA: Diagnosis not present

## 2014-02-02 DIAGNOSIS — I200 Unstable angina: Secondary | ICD-10-CM

## 2014-02-02 DIAGNOSIS — Z760 Encounter for issue of repeat prescription: Secondary | ICD-10-CM | POA: Insufficient documentation

## 2014-02-02 DIAGNOSIS — R079 Chest pain, unspecified: Secondary | ICD-10-CM

## 2014-02-02 DIAGNOSIS — I2510 Atherosclerotic heart disease of native coronary artery without angina pectoris: Secondary | ICD-10-CM | POA: Insufficient documentation

## 2014-02-02 DIAGNOSIS — G894 Chronic pain syndrome: Secondary | ICD-10-CM | POA: Diagnosis not present

## 2014-02-02 DIAGNOSIS — Z720 Tobacco use: Secondary | ICD-10-CM

## 2014-02-02 DIAGNOSIS — I10 Essential (primary) hypertension: Secondary | ICD-10-CM

## 2014-02-02 DIAGNOSIS — F17200 Nicotine dependence, unspecified, uncomplicated: Secondary | ICD-10-CM | POA: Diagnosis not present

## 2014-02-02 LAB — TSH: TSH: 18.236 u[IU]/mL — AB (ref 0.350–4.500)

## 2014-02-02 MED ORDER — TRAMADOL HCL 50 MG PO TABS: 50.0000 mg | ORAL_TABLET | Freq: Three times a day (TID) | ORAL | Status: DC | PRN

## 2014-02-02 MED ORDER — FUROSEMIDE 20 MG PO TABS: 20.0000 mg | ORAL_TABLET | Freq: Every day | ORAL | Status: DC

## 2014-02-02 MED ORDER — ALPRAZOLAM 0.25 MG PO TABS: 0.2500 mg | ORAL_TABLET | Freq: Two times a day (BID) | ORAL | Status: DC | PRN

## 2014-02-02 MED ORDER — POTASSIUM CHLORIDE CRYS ER 20 MEQ PO TBCR: 20.0000 meq | EXTENDED_RELEASE_TABLET | Freq: Every day | ORAL | Status: DC

## 2014-02-02 MED ORDER — LISINOPRIL 10 MG PO TABS: 10.0000 mg | ORAL_TABLET | Freq: Every day | ORAL | Status: DC

## 2014-02-02 MED ORDER — BUPROPION HCL ER (SR) 150 MG PO TB12: 150.0000 mg | ORAL_TABLET | Freq: Two times a day (BID) | ORAL | Status: DC

## 2014-02-02 MED ORDER — PRAVASTATIN SODIUM 80 MG PO TABS: 80.0000 mg | ORAL_TABLET | Freq: Every evening | ORAL | Status: DC

## 2014-02-02 NOTE — Assessment & Plan Note (Signed)
A: stable w/o recurrent CP symptoms or hypotension P: Continue current regimen If patient stays on effient she can get it on the PASS program through my office, will await cardiology recs.  ? BB, defer to cardiology  Smoking cessation

## 2014-02-02 NOTE — Assessment & Plan Note (Signed)
A: on chronic Bz, also previously on chronic opiod narcotics when living in [place] P: Refill BZ Evaluate fully at f/u with plan to transition off BZ or to lowest effective dos of BZ on safe longterm medication like SSRI/SNRI

## 2014-02-02 NOTE — Assessment & Plan Note (Signed)
A; previously on vicodin in [place]. Pain is in legs. Prescribed oxycodone upon hospital discharge. Informed patient that I do not do chronic pain management with narcotics.  P: Tramadol

## 2014-02-02 NOTE — Assessment & Plan Note (Addendum)
A: chronic.  P: Check TSH Refill synthroid once TSH dose reviewed   Lab Results  Component Value Date   TSH 18.236* 02/02/2014   Increase synthroid dose and repeat TSH in 8-12 weeks

## 2014-02-02 NOTE — Assessment & Plan Note (Signed)
A: ready to quit P: Recommend nicotine patch Starting wellbutrin

## 2014-02-02 NOTE — Assessment & Plan Note (Signed)
Resolved  s/p stenting  

## 2014-02-02 NOTE — Progress Notes (Signed)
   Subjective:    Patient ID: Diana CagePatricia Erickson, female    DOB: March 28, 1956, 58 y.o.   MRN: [id number] CC: establish care, HFU for  HPI 58 yo F presents for hospital follow up and establish care:  1. HFU CAD: s/p stent placement. On effient and ASA. Denies CP. Admits to some SOB. Sleeps on two pillows.   2. Hypothyroidism: since 1990s. On same dose of synthroid for years. TSH has not been checked recently. No swelling. No palpitations.   3. Anxiety: chronic symptoms per patient on low dose xanax. Also reports history of chronic pain in legs previously on vicodin when living in [place]. Moved from [place] to [place] in 09/2013.   Past med hx: hypothyroidism Fam hx: CAD  Soc hx: current smoker, desires to quit  Review of Systems As per HPI     Objective:   Physical Exam BP 109/76  Pulse 80  Temp(Src) 98.4 F (36.9 C) (Oral)  Resp 18  Ht 5\' 9"  (1.753 m)  Wt 185 lb (83.915 kg)  BMI 27.31 kg/m2  SpO2 100% General appearance: alert, cooperative and no distress Neck: no thyromegaly  Lungs: clear to auscultation bilaterally Heart: regular rate and rhythm, S1, S2 normal, no murmur, click, rub or gallop Extremities: extremities normal, atraumatic, no cyanosis or edema        Assessment & Plan:

## 2014-02-02 NOTE — Patient Instructions (Signed)
Ms. Diana Erickson,   Thank you for comiing in today. It was a pleasure meeting you. I look forward to being your primary doctor.  1. Medications: [facility] [place].   2. Smoking: Smoking cessation support: smoking cessation hotline: 1-800-QUIT-NOW.  Smoking cessation classes are available through [facility]. Call [phone] or visit our website at [email or web address]. Start wellbutrin 150 mg in the morning for first 3 days, then twice daily second dos before 6 pm. Set quit date for smoking one week after starting wellbutrin.   3. Anxiety: refilled xananx.   4. Chronic pain: step down from hydrocodone/oxycodone to tramadol.   You will be called with TSH results and synthroid refills will be [facility]   F/u in 4-6 weeks for physical with pap.   Dr. Armen PickupFunches

## 2014-02-02 NOTE — Assessment & Plan Note (Signed)
Resolved  s/p stenting

## 2014-02-02 NOTE — Progress Notes (Signed)
Establish Care HFU  Medicine refill

## 2014-02-03 MED ORDER — LEVOTHYROXINE SODIUM 125 MCG PO TABS: 125.0000 ug | ORAL_TABLET | Freq: Every day | ORAL | Status: DC

## 2014-02-03 NOTE — Addendum Note (Signed)
Addended by: Dessa PhiFUNCHES, Takesha Steger on: 02/03/2014 02:09 PM   Modules accepted: Orders

## 2014-02-08 ENCOUNTER — Telehealth: Payer: Self-pay | Admitting: *Deleted

## 2014-02-08 NOTE — Telephone Encounter (Signed)
Message copied by Dyann KiefGIRALDEZ, Curties Conigliaro M on Wed Feb 08, 2014  6:01 PM ------      Message from: Dessa PhiFUNCHES, JOSALYN      Created: Fri Feb 03, 2014  2:07 PM       Elevated TSH       [facility] [place]      Repeat TSH in 8-12 weeks ------

## 2014-02-08 NOTE — Telephone Encounter (Signed)
Pt aware of lab results, and medicine change

## 2014-02-17 ENCOUNTER — Encounter: Payer: Self-pay | Admitting: *Deleted

## 2014-02-20 ENCOUNTER — Ambulatory Visit (INDEPENDENT_AMBULATORY_CARE_PROVIDER_SITE_OTHER): Payer: Medicaid Other | Admitting: Physician Assistant

## 2014-02-20 ENCOUNTER — Encounter: Payer: Self-pay | Admitting: Physician Assistant

## 2014-02-20 VITALS — BP 126/80 | HR 71 | Ht 69.0 in | Wt 186.0 lb

## 2014-02-20 DIAGNOSIS — I2510 Atherosclerotic heart disease of native coronary artery without angina pectoris: Secondary | ICD-10-CM

## 2014-02-20 DIAGNOSIS — E785 Hyperlipidemia, unspecified: Secondary | ICD-10-CM

## 2014-02-20 DIAGNOSIS — E039 Hypothyroidism, unspecified: Secondary | ICD-10-CM

## 2014-02-20 DIAGNOSIS — I429 Cardiomyopathy, unspecified: Secondary | ICD-10-CM

## 2014-02-20 DIAGNOSIS — I10 Essential (primary) hypertension: Secondary | ICD-10-CM

## 2014-02-20 MED ORDER — NITROGLYCERIN 0.4 MG SL SUBL: 0.4000 mg | SUBLINGUAL_TABLET | SUBLINGUAL | Status: DC | PRN

## 2014-02-20 MED ORDER — ASPIRIN EC 81 MG PO TBEC: 81.0000 mg | DELAYED_RELEASE_TABLET | Freq: Every day | ORAL | Status: AC

## 2014-02-20 NOTE — Progress Notes (Signed)
Cardiology Office Note   Date:  02/20/2014   ID:  Diana Erickson, DOB 05/11/1955, MRN [id number]  PCP:  Lora PaulaFUNCHES, JOSALYN C, MD  Cardiologist:  Dr. Dietrich PatesPaula Ross     History of Present Illness: Diana Erickson is a 58 y.o. female with a hx of HTN, HL, tobacco abuse.  Admitted 10/17-10/20 with chest pain c/w unstable angina.  CEs remained negative.  EF was 45-50% by echo.  LHC demonstrated borderline lesion in OM1.  This was significant by IVUS and patient underwent PCI with DES to OM1.  She returns for FU. The patient denies chest pain, shortness of breath, syncope, orthopnea, PND or significant pedal edema.    Studies:  - LHC (10/15):  Ostial LM 20-30%, prox OM1 60%, EF 60% >> IVUS guided PCI: 2.5 x 18 mm Xience Alpine DEX to OM1   - Echo (10/15):  Mild LVH, EF 45-50%, no RWMA, trivial eff   Recent Labs/Images:  01/28/2014: Pro B Natriuretic peptide (BNP) 42.1 01/29/2014: LDL (calc) 148* 01/31/2014: BUN 14; Creatinine 0.84; Hemoglobin 12.9; Potassium 4.1; Sodium 141 02/02/2014: TSH 18.236*     Dg Chest Port 1 View  01/28/2014    IMPRESSION: Changes of COPD with borderline cardiomegaly, mild pulmonary vascular congestion and probable mild interstitial pulmonary edema.   Electronically Signed   By: Gordan PaymentSteve  Reid M.D.   On: 01/28/2014 21:51     Wt Readings from Last 3 Encounters:  02/20/14 186 lb (84.369 kg)  02/02/14 185 lb (83.915 kg)  01/31/14 185 lb 6.5 oz (84.1 kg)     Past Medical History  Diagnosis Date  . Elevated cholesterol Dx 2013  . Hypertension Dx 2007  . Thyroid disease 1990s    Current Outpatient Prescriptions  Medication Sig Dispense Refill  . ALPRAZolam (XANAX) 0.25 MG tablet Take 1 tablet (0.25 mg total) by mouth 2 (two) times daily as needed for anxiety. 60 tablet 2  . aspirin EC 325 MG EC tablet Take 1 tablet (325 mg total) by mouth daily. 30 tablet 0  . buPROPion (WELLBUTRIN SR) 150 MG 12 hr tablet Take 1 tablet (150 mg total) by mouth 2 (two) times  daily. 60 tablet 1  . furosemide (LASIX) 20 MG tablet Take 1 tablet (20 mg total) by mouth daily. 90 tablet 1  . levothyroxine (SYNTHROID, LEVOTHROID) 125 MCG tablet Take 1 tablet (125 mcg total) by mouth daily before breakfast. 60 tablet 1  . lisinopril (PRINIVIL,ZESTRIL) 10 MG tablet Take 1 tablet (10 mg total) by mouth daily. 90 tablet 1  . oxyCODONE (OXY IR/ROXICODONE) 5 MG immediate release tablet Take 1 tablet (5 mg total) by mouth every 6 (six) hours as needed for severe pain. 20 tablet 0  . potassium chloride SA (K-DUR,KLOR-CON) 20 MEQ tablet Take 1 tablet (20 mEq total) by mouth daily. 90 tablet 1  . prasugrel (EFFIENT) 10 MG TABS tablet Take 1 tablet (10 mg total) by mouth daily. 30 tablet 1  . simvastatin (ZOCOR) 40 MG tablet Take 40 mg by mouth daily.    . traMADol (ULTRAM) 50 MG tablet Take 1 tablet (50 mg total) by mouth every 8 (eight) hours as needed. 45 tablet 0   No current facility-administered medications for this visit.     Allergies:   Review of patient's allergies indicates no known allergies.   Social History:  The patient  reports that she has been smoking.  She does not have any smokeless tobacco history on file. She reports that she drinks alcohol.  She reports that she does not use illicit drugs.   Family History:  The patient's family history includes CAD in her mother; Cancer - Lung in her father and sister; Diabetes type II in her mother and sister; Heart attack in her sister and another family member; Heart failure in her mother; Hypertension in her mother, sister, and son.   ROS:  Please see the history of present illness.   She feels nervous sometimes.   All other systems reviewed and negative.    PHYSICAL EXAM: VS:  BP 126/80 mmHg  Pulse 71  Ht 5\' 9"  (1.753 m)  Wt 186 lb (84.369 kg)  BMI 27.45 kg/m2 Well nourished, well developed, in no acute distress HEENT: normal Neck:  no JVD Cardiac:  normal S1, S2;  RRR; no murmur Lungs:   clear to auscultation  bilaterally, no wheezing, rhonchi or rales Abd: soft, nontender, no hepatomegaly Ext:  no edemaright wrist without hematoma or mass  Skin: warm and dry Neuro:  CNs 2-12 intact, no focal abnormalities noted  EKG:  NSR, HR 71, normal axis, low voltage, 1st degree AVB, no significant ST changes.      ASSESSMENT AND PLAN:  1.  Coronary artery disease:  Doing well without angina.  [facility] in [facility]  She is applying for financial assistance in order to use their pharmacy.  She cannot afford Effient.  We will check with the pharmacy to see if they can cover it for her.  Otherwise, she will have to change to Plavix.  She is considering going to cardiac rehab at [facility].    -  Continue ASA, Effient.  Change to Plavix 75 mg QD (no loading dose needed) after 30 days of Effient if necessary.    -  Decrease ASA to 81 mg QD.    -  Continue statin.    -  Beta blocker was not used due to bradycardia.    -  Enroll in cardiac rehab in [place] if patient can afford.  2.  Cardiomyopathy:  EF was normal by cardiac cath.  No beta blocker was used due to bradycardia.  HR today would tolerate, but will hold off for now (1st degree AVB).      -  Continue ACEI. 3.  Essential hypertension:  BP controlled.   4.  HLD (hyperlipidemia):  Continue Simvastatin.      -  Arrange FU Lipids and LFTs. 5.  Hypothyroidism, unspecified hypothyroidism type:  TSH elevated in the hospital.  FU with primary care.    Disposition:   FU with Dr. Dietrich PatesPaula Ross 8 weeks.    Signed, Brynda RimScott Mikiah Durall, PA-C, MHS 02/20/2014 11:34 AM    [facility] [address], [place], [place]  [address] Phone: [phone]) [phone]; Fax: [phone]) [phone]

## 2014-02-20 NOTE — Patient Instructions (Signed)
DECREASE ASPIRIN TO 81 MG DAILY  AN RX FOR NTG HAS BEEN SENT IN AND YOU HAVE BEEN ADVISED AS TO HOW AND WHEN TO USE NTG  FASTING LIPID AND LIVER PANEL TO BE DONE IN 6 WEEKS ;   Your physician recommends that you schedule a follow-up appointment in: 8 WEEKS WITH DR. ROSS  I HAVE A LEFT A MESSAGE WITH [facility] ABOUT EFFIENT;  I HAVE GIVEN YOU AN RX FOR PLAVIX; HOWEVER DO NOT FILL UNTIL YOU HEAR BACK FROM CARDIOLOGY; SO YOU WILL CONTINUE ON THE EFFIENT AT THIS TIME.

## 2014-02-21 ENCOUNTER — Other Ambulatory Visit: Payer: Self-pay | Admitting: Family Medicine

## 2014-02-21 ENCOUNTER — Telehealth: Payer: Self-pay | Admitting: Physician Assistant

## 2014-02-21 MED ORDER — PRASUGREL HCL 10 MG PO TABS: 10.0000 mg | ORAL_TABLET | Freq: Every day | ORAL | Status: DC

## 2014-02-21 NOTE — Telephone Encounter (Signed)
s/w pt, advised I had not heard back from Dr. Alisa GraffFunches's office. I asked pt if we had wrong ph #, pt gave me [phone]. I then called Dr. Armen PickupFunches w/correct ph# and s/w pharm tech about getting pt approved w/PASS program for effient. Pharm tech said Rx has to be written by Dr. Armen PickupFunches in order for pt to apply for the PASS assistance program, at that time she transferred me to Dr. Alisa GraffFunches's nurse which I lmom to discuss about getting Dr. Armen PickupFunches to write Rx for Effient. I also d/w Bing NeighborsScott W. PA in our office to make him aware of this. He said ok for Dr. Armen PickupFunches to write Rx as long as they can get approval from PASS assistance program for the Effient. If they cannot get PASS approval then per Bing NeighborsScott W. PA ov yesterday we will switch pt to Plavix. See ov note 02/20/14 Tereso NewcomerScott Weaver, PA.

## 2014-02-21 NOTE — Telephone Encounter (Signed)
Sent effient Rx to [facility] for pass program.

## 2014-02-21 NOTE — Telephone Encounter (Signed)
New message     Pt saw Scott yesterday.  Talk to Okey RegalCarol about seeing if she can get assistance with some medication.

## 2014-02-24 NOTE — Telephone Encounter (Signed)
Follow Up       Pt calling stating she is trying to get in touch with Diana Erickson about medication. Please call back and advise.

## 2014-02-27 NOTE — Telephone Encounter (Signed)
d/w pt that Dr. Armen PickupFunches will have to write the Rx for Effient if wanting to try and get approval through PASS Program. Pt has appt 11/18 with Dr. Armen PickupFunches, will find out from them about PASS/Rx. Pt to pick up samples of Effient at front desk 11/18.

## 2014-03-01 ENCOUNTER — Encounter: Payer: Self-pay | Admitting: Family Medicine

## 2014-03-01 ENCOUNTER — Ambulatory Visit: Payer: Self-pay | Attending: Family Medicine

## 2014-03-01 ENCOUNTER — Ambulatory Visit: Payer: Medicaid Other | Attending: Family Medicine | Admitting: Family Medicine

## 2014-03-01 VITALS — BP 120/82 | HR 91 | Temp 98.5°F | Resp 16 | Ht 69.0 in | Wt 184.0 lb

## 2014-03-01 DIAGNOSIS — N760 Acute vaginitis: Secondary | ICD-10-CM

## 2014-03-01 DIAGNOSIS — F17200 Nicotine dependence, unspecified, uncomplicated: Secondary | ICD-10-CM | POA: Insufficient documentation

## 2014-03-01 DIAGNOSIS — G894 Chronic pain syndrome: Secondary | ICD-10-CM | POA: Diagnosis not present

## 2014-03-01 DIAGNOSIS — K219 Gastro-esophageal reflux disease without esophagitis: Secondary | ICD-10-CM | POA: Insufficient documentation

## 2014-03-01 DIAGNOSIS — Z114 Encounter for screening for human immunodeficiency virus [HIV]: Secondary | ICD-10-CM | POA: Insufficient documentation

## 2014-03-01 DIAGNOSIS — M79672 Pain in left foot: Secondary | ICD-10-CM | POA: Insufficient documentation

## 2014-03-01 DIAGNOSIS — F419 Anxiety disorder, unspecified: Secondary | ICD-10-CM | POA: Diagnosis not present

## 2014-03-01 DIAGNOSIS — M79604 Pain in right leg: Secondary | ICD-10-CM | POA: Diagnosis not present

## 2014-03-01 DIAGNOSIS — A630 Anogenital (venereal) warts: Secondary | ICD-10-CM | POA: Diagnosis not present

## 2014-03-01 DIAGNOSIS — A499 Bacterial infection, unspecified: Secondary | ICD-10-CM

## 2014-03-01 DIAGNOSIS — I5022 Chronic systolic (congestive) heart failure: Secondary | ICD-10-CM

## 2014-03-01 DIAGNOSIS — Z124 Encounter for screening for malignant neoplasm of cervix: Secondary | ICD-10-CM | POA: Diagnosis not present

## 2014-03-01 DIAGNOSIS — Z113 Encounter for screening for infections with a predominantly sexual mode of transmission: Secondary | ICD-10-CM

## 2014-03-01 DIAGNOSIS — B9689 Other specified bacterial agents as the cause of diseases classified elsewhere: Secondary | ICD-10-CM

## 2014-03-01 DIAGNOSIS — F411 Generalized anxiety disorder: Secondary | ICD-10-CM

## 2014-03-01 DIAGNOSIS — I2510 Atherosclerotic heart disease of native coronary artery without angina pectoris: Secondary | ICD-10-CM | POA: Diagnosis present

## 2014-03-01 DIAGNOSIS — R87810 Cervical high risk human papillomavirus (HPV) DNA test positive: Secondary | ICD-10-CM

## 2014-03-01 LAB — LIPID PANEL
CHOL/HDL RATIO: 4 ratio
CHOLESTEROL: 166 mg/dL (ref 0–200)
HDL: 41 mg/dL (ref >39)
LDL Cholesterol: 86 mg/dL (ref 0–99)
Triglycerides: 193 mg/dL — ABNORMAL HIGH (ref <150)
VLDL: 39 mg/dL (ref 0–40)

## 2014-03-01 MED ORDER — LISINOPRIL 10 MG PO TABS: 10.0000 mg | ORAL_TABLET | Freq: Every day | ORAL | Status: DC

## 2014-03-01 MED ORDER — ESOMEPRAZOLE MAGNESIUM 20 MG PO CPDR
20.0000 mg | DELAYED_RELEASE_CAPSULE | Freq: Every day | ORAL | Status: DC
Start: 2014-03-01 — End: 2014-03-28

## 2014-03-01 MED ORDER — ESCITALOPRAM OXALATE 10 MG PO TABS: 10.0000 mg | ORAL_TABLET | Freq: Every day | ORAL | Status: DC

## 2014-03-01 MED ORDER — SIMVASTATIN 40 MG PO TABS: 40.0000 mg | ORAL_TABLET | Freq: Every day | ORAL | Status: DC

## 2014-03-01 MED ORDER — NITROGLYCERIN 0.4 MG SL SUBL: 0.4000 mg | SUBLINGUAL_TABLET | SUBLINGUAL | Status: AC | PRN

## 2014-03-01 MED ORDER — POTASSIUM CHLORIDE CRYS ER 20 MEQ PO TBCR: 20.0000 meq | EXTENDED_RELEASE_TABLET | Freq: Every day | ORAL | Status: DC

## 2014-03-01 NOTE — Progress Notes (Signed)
F/U Cardiology appointment  Stated blood work  Need information for PASS Medicine refills

## 2014-03-01 NOTE — Assessment & Plan Note (Signed)
Pap done today  

## 2014-03-01 NOTE — Assessment & Plan Note (Signed)
A: chronic genital warts P: Treated with cryotherapy x 2

## 2014-03-01 NOTE — Assessment & Plan Note (Signed)
A: CAD w/o s/s of angina P: Refilled NTG for prn use, sent to onsite pharmacy Refilled effient, patient to apply for PASS

## 2014-03-01 NOTE — Progress Notes (Signed)
   Subjective:    Patient ID: Diana Erickson, female    DOB: 12/18/1955, 58 y.o.   MRN: [id number] CC: f/u CAD, pap smear, genital warts, L foot pain   HPI 58 yo F presents for f/u visit:  1. CAD: taking effient. Could not afford nitroglycerin. No CP. Having some nausea x 3 days with heartburn. Cardiologist requesting fasting lipids.   2. Anxiety: taking xanax. Requesting increase dose for chronic daily anxiety. Previously on valium TID while in [place]. Taking wellbutrin for smoking cessation.   3. Genital warts: for many years. Never mentioned or treated due to embarrassment.  Her longterm sex partner also has genital warts.   4. L foot pain: x 3-4 months. Initially with swelling and redness on the dorsum of the foot. No injury.   5. HM: due for pap. Does not recall last pap. No abnormal vaginal bleeding or discharge.   Soc hx: current smoker, cutting back  Review of Systems  As per HPI      Objective:   Physical Exam BP 120/82 mmHg  Pulse 91  Temp(Src) 98.5 F (36.9 C) (Oral)  Resp 16  Ht 5\' 9"  (1.753 m)  Wt 184 lb (83.462 kg)  BMI 27.16 kg/m2  SpO2 100% General appearance: alert, cooperative and no distress Pelvic: cervix normal in appearance, no adnexal masses or tenderness, no cervical motion tenderness, positive findings: external genitalia with warts around perineum and anus , rectovaginal septum normal, uterus normal size, shape, and consistency and vagina normal without discharge L foot: normal w/o swelling, tenderness or erythema   Genital warts treated with cryotherapy x 2  cycles        Assessment & Plan:

## 2014-03-01 NOTE — Assessment & Plan Note (Signed)
Screening HIV  

## 2014-03-01 NOTE — Assessment & Plan Note (Signed)
A: chronic anxiety, patient on chronic BZ P:  Continue xanax Start lexapro

## 2014-03-01 NOTE — Assessment & Plan Note (Signed)
A: patient with chronic pain in R leg requesting oxycodone P: Informed patient that I did write for chronic narcotics, tramadol and tylenol #3 or the max pain control options Patient currently on tramadol

## 2014-03-01 NOTE — Assessment & Plan Note (Signed)
A; nausea and reflux symptoms P: PPI ordered, nexium

## 2014-03-01 NOTE — Patient Instructions (Signed)
Ms. Diana Erickson,  It was a pleasure seeing you again today  1. HTN/CAD/CHF: Refilled lisinopril, potassium, nitroglycerin  I have sent Rx for effient to on site pharmacy, apply for PASS.  2. Pap done today you will be called with results.  3. Anxiety: start lexapro 10 mg daily along with xanax as needed  4. L foot pain: while at the hospital please go for foot x-ray.   F/u in 4  week for repeat TSH, anxiety follow up  Dr. Armen PickupFunches

## 2014-03-01 NOTE — Assessment & Plan Note (Signed)
A: L foot pain, normal exam, ? Neuro P: Dg foot Tramadol prn

## 2014-03-02 LAB — CERVICOVAGINAL ANCILLARY ONLY
CHLAMYDIA, DNA PROBE: NEGATIVE
Neisseria Gonorrhea: NEGATIVE
WET PREP (BD AFFIRM): NEGATIVE
WET PREP (BD AFFIRM): POSITIVE — AB
Wet Prep (BD Affirm): NEGATIVE

## 2014-03-02 LAB — HIV ANTIBODY (ROUTINE TESTING W REFLEX): HIV: NONREACTIVE

## 2014-03-02 LAB — CYTOLOGY - PAP

## 2014-03-03 ENCOUNTER — Telehealth: Payer: Self-pay | Admitting: Internal Medicine

## 2014-03-03 DIAGNOSIS — B9689 Other specified bacterial agents as the cause of diseases classified elsewhere: Secondary | ICD-10-CM | POA: Insufficient documentation

## 2014-03-03 DIAGNOSIS — N760 Acute vaginitis: Secondary | ICD-10-CM

## 2014-03-03 MED ORDER — FLUCONAZOLE 150 MG PO TABS: 150.0000 mg | ORAL_TABLET | Freq: Once | ORAL | Status: DC

## 2014-03-03 MED ORDER — METRONIDAZOLE 500 MG PO TABS: 500.0000 mg | ORAL_TABLET | Freq: Two times a day (BID) | ORAL | Status: DC

## 2014-03-03 NOTE — Telephone Encounter (Signed)
New message ° ° °Patient returning call back to nurse.  °

## 2014-03-03 NOTE — Addendum Note (Signed)
Addended by: Dessa PhiFUNCHES, Dalisa Forrer on: 03/03/2014 05:44 PM   Modules accepted: Orders

## 2014-03-03 NOTE — Telephone Encounter (Signed)
I rtnd pt's call to me from earlier today. Pt was calling to let us know that Dr. Armen PickupFunches wrote another Rx for Effient and they have started paperwork for assistance through a program called PASS for effient. Pt asked if PA would do Rx for pain med.

## 2014-03-03 NOTE — Assessment & Plan Note (Signed)
A: BV on wet prep P: Flagyl and diflucan  

## 2014-03-08 DIAGNOSIS — R87810 Cervical high risk human papillomavirus (HPV) DNA test positive: Secondary | ICD-10-CM | POA: Insufficient documentation

## 2014-03-08 NOTE — Assessment & Plan Note (Signed)
High risk HPV on normal pap.  Plan: repeat pap in one year.

## 2014-03-14 ENCOUNTER — Telehealth: Payer: Self-pay | Admitting: Family Medicine

## 2014-03-14 NOTE — Telephone Encounter (Signed)
Pt. Calling to request blood work results, and pap smear results. Please f/u with pt.

## 2014-03-15 ENCOUNTER — Telehealth: Payer: Self-pay | Admitting: Family Medicine

## 2014-03-15 NOTE — Telephone Encounter (Signed)
Pt. Calling to request blood work results, and pap smear results. Please f/u with pt. °

## 2014-03-16 ENCOUNTER — Telehealth: Payer: Self-pay

## 2014-03-16 ENCOUNTER — Other Ambulatory Visit: Payer: Self-pay | Admitting: *Deleted

## 2014-03-16 DIAGNOSIS — N760 Acute vaginitis: Principal | ICD-10-CM

## 2014-03-16 DIAGNOSIS — B9689 Other specified bacterial agents as the cause of diseases classified elsewhere: Secondary | ICD-10-CM

## 2014-03-16 MED ORDER — METRONIDAZOLE 500 MG PO TABS: 500.0000 mg | ORAL_TABLET | Freq: Two times a day (BID) | ORAL | Status: DC

## 2014-03-16 MED ORDER — FLUCONAZOLE 150 MG PO TABS: 150.0000 mg | ORAL_TABLET | Freq: Once | ORAL | Status: DC

## 2014-03-16 NOTE — Telephone Encounter (Signed)
Pt Aware of lab results. Stated need Rx E[facility]  to [facility] [place]

## 2014-03-16 NOTE — Telephone Encounter (Signed)
Pt calling for blood work and pap smear results taken on 03/01/14. Please f/u with pt.

## 2014-03-16 NOTE — Telephone Encounter (Signed)
-----   Message from Lora PaulaJosalyn C Funches, MD sent at 03/03/2014  5:42 PM EST ----- Elevated triglycerides, overall lipid panel improved. Continue simvastatin 40.  HIV negative GC/chlam negative BV on wet prep sent in flagyl

## 2014-03-16 NOTE — Telephone Encounter (Signed)
Returned patient phone call Patient had already spoken to staff and is aware of her pap results

## 2014-03-16 NOTE — Telephone Encounter (Signed)
-----   Message from Lora PaulaJosalyn C Funches, MD sent at 03/08/2014  9:52 AM EST ----- High risk HPV on normal pap.  Plan: repeat pap in one year.

## 2014-03-23 ENCOUNTER — Encounter (HOSPITAL_COMMUNITY): Payer: Self-pay | Admitting: Cardiovascular Disease

## 2014-03-28 ENCOUNTER — Encounter: Payer: Self-pay | Admitting: Family Medicine

## 2014-03-28 ENCOUNTER — Ambulatory Visit: Payer: Medicaid Other | Attending: Family Medicine | Admitting: Family Medicine

## 2014-03-28 VITALS — Resp 18 | Ht 69.0 in | Wt 187.0 lb

## 2014-03-28 DIAGNOSIS — F419 Anxiety disorder, unspecified: Secondary | ICD-10-CM | POA: Insufficient documentation

## 2014-03-28 DIAGNOSIS — R87810 Cervical high risk human papillomavirus (HPV) DNA test positive: Secondary | ICD-10-CM

## 2014-03-28 DIAGNOSIS — N760 Acute vaginitis: Secondary | ICD-10-CM

## 2014-03-28 DIAGNOSIS — E039 Hypothyroidism, unspecified: Secondary | ICD-10-CM | POA: Diagnosis not present

## 2014-03-28 DIAGNOSIS — A499 Bacterial infection, unspecified: Secondary | ICD-10-CM

## 2014-03-28 DIAGNOSIS — K219 Gastro-esophageal reflux disease without esophagitis: Secondary | ICD-10-CM

## 2014-03-28 DIAGNOSIS — B9689 Other specified bacterial agents as the cause of diseases classified elsewhere: Secondary | ICD-10-CM

## 2014-03-28 DIAGNOSIS — F411 Generalized anxiety disorder: Secondary | ICD-10-CM

## 2014-03-28 DIAGNOSIS — G894 Chronic pain syndrome: Secondary | ICD-10-CM

## 2014-03-28 DIAGNOSIS — F17210 Nicotine dependence, cigarettes, uncomplicated: Secondary | ICD-10-CM | POA: Diagnosis not present

## 2014-03-28 MED ORDER — ESCITALOPRAM OXALATE 20 MG PO TABS: 20.0000 mg | ORAL_TABLET | Freq: Every day | ORAL | Status: DC

## 2014-03-28 MED ORDER — TRAMADOL HCL 50 MG PO TABS: 50.0000 mg | ORAL_TABLET | Freq: Three times a day (TID) | ORAL | Status: DC | PRN

## 2014-03-28 MED ORDER — ALPRAZOLAM 0.25 MG PO TABS: ORAL_TABLET | ORAL | Status: DC

## 2014-03-28 MED ORDER — PANTOPRAZOLE SODIUM 40 MG PO TBEC: 40.0000 mg | DELAYED_RELEASE_TABLET | Freq: Every day | ORAL | Status: DC

## 2014-03-28 NOTE — Progress Notes (Signed)
   Subjective:    Patient ID: Diana Erickson, female    DOB: [birth year], 58 y.o.   MRN: [id number] CC: f/u anxiety  HPI 58 yo F presents for f/u:  1. Anxiety: persistent. Poor sleep. Early waking. Still smoking. Cutting back. Would like to increase xanax dose. Taking lexapro 10 daily. Taking wellbutrin 150 mg BID.  2. High risk HPV on pap: this news worried her. She wanted to discuss.   3. Hypothyroidism: taking synthroid. About 8 weeks since last TSH check, a bit earlier than 8 weeks. Has depression, anxiety, feeling slight weight gain.   Soc Hx: smoking 1/2 PPD  Review of Systems As per HPI  GAD-7: score of 21, 3 in all areas     Objective:   Physical Exam Resp 18  Ht 5\' 9"  (1.753 m)  Wt 187 lb (84.823 kg)  BMI 27.60 kg/m2 General appearance: alert, cooperative and no distress Lungs: clear to auscultation bilaterally Heart: regular rate and rhythm, S1, S2 normal, no murmur, click, rub or gallop Extremities: extremities normal, atraumatic, no cyanosis or edema  Reviewed pap results and f/u plan     Assessment & Plan:

## 2014-03-28 NOTE — Progress Notes (Signed)
F/U anxiety repeat TSH Stated has low

## 2014-03-28 NOTE — Assessment & Plan Note (Signed)
A: compliant with synthroid P: Repeat TSH in 2-3 weeks at f/u visit  Lab Results  Component Value Date   TSH 18.236* 02/02/2014

## 2014-03-28 NOTE — Assessment & Plan Note (Signed)
Nearly done with flagyl will resolve problem

## 2014-03-28 NOTE — Patient Instructions (Addendum)
Mrs. Langston MaskerMorris,  Thank you for coming in today.  1. Anxiety: Increase lexapro to 20 mg daily  Xanax 0.25 mg in AM, 0.50 mg in PM  F/u in 2-3 weeks for repeat TSH and cryotherapy .  Dr. Armen PickupFunches

## 2014-03-28 NOTE — Assessment & Plan Note (Signed)
Discussed with patient  Will repeat pap in one year

## 2014-03-28 NOTE — Assessment & Plan Note (Signed)
A: persistent. P: Increase lexapro to 20 mg daily. Advised patient to be sure to take second wellbutrin does before 6 pm Increase xanax to 0.25 mg in AM, 0.50 mg in PM

## 2014-03-29 ENCOUNTER — Telehealth: Payer: Self-pay | Admitting: *Deleted

## 2014-03-29 NOTE — Telephone Encounter (Signed)
-----   Message from Lora PaulaJosalyn C Funches, MD sent at 03/28/2014  1:31 PM EST ----- Francena HanlyStella can you please call Mrs. Derrick and pass on the info that she should be sure to take her second wellbutrin dose before 6 pm or else it can interfere with sleep.

## 2014-03-30 NOTE — Telephone Encounter (Signed)
Pt aware of below information.

## 2014-04-03 ENCOUNTER — Other Ambulatory Visit: Payer: Self-pay

## 2014-04-11 ENCOUNTER — Ambulatory Visit: Payer: Self-pay | Admitting: Family Medicine

## 2014-04-23 NOTE — Progress Notes (Signed)
Cardiology Office Note   Date:  04/24/2014    History of Present Illness: Diana Erickson is a 59 y.o. female with a hx of HTN, HL, tobacco abuse.  Admitted 10/17-10/20 with chest pain c/w unstable angina.  CEs remained negative.  EF was 45-50% by echo.  LHC demonstrated borderline lesion in OM1.  This was significant by IVUS and patient underwent PCI with DES to OM1.  She returns for FU. The patient denies chest pain, shortness of breath, syncope, orthopnea, PND or significant pedal edema.   Seen by Wende MottS Weaver in November Since then has used NTG once.  Breathing is fair  Being eval for tooth extraction   Studies:  - LHC (10/15):  Ostial LM 20-30%, prox OM1 60%, EF 60% >> IVUS guided PCI: 2.5 x 18 mm Xience Alpine DEX to OM1   - Echo (10/15):  Mild LVH, EF 45-50%, no RWMA, trivial eff     Wt Readings from Last 3 Encounters:  04/24/14 189 lb (85.73 kg)  04/24/14 190 lb (86.183 kg)  03/28/14 187 lb (84.823 kg)     Past Medical History  Diagnosis Date  . Elevated cholesterol Dx 2013  . Hypertension Dx 2007  . Thyroid disease 1990s    Current Outpatient Prescriptions  Medication Sig Dispense Refill  . ALPRAZolam (XANAX) 0.25 MG tablet 0.25 mg in the AM, 0.50 mg in the evening (Patient taking differently: Take 0.25 mg by mouth 2 (two) times daily as needed for sleep. 0.25 mg in the AM, 0.50 mg in the evening) 75 tablet 2  . aspirin 81 MG tablet Take 1 tablet (81 mg total) by mouth daily.    Marland Kitchen. buPROPion (WELLBUTRIN SR) 150 MG 12 hr tablet Take 1 tablet (150 mg total) by mouth 2 (two) times daily. 60 tablet 1  . escitalopram (LEXAPRO) 20 MG tablet Take 1 tablet (20 mg total) by mouth daily. 30 tablet 2  . furosemide (LASIX) 20 MG tablet Take 1 tablet (20 mg total) by mouth daily. 90 tablet 1  . levothyroxine (SYNTHROID, LEVOTHROID) 125 MCG tablet Take 1 tablet (125 mcg total) by mouth daily before breakfast. 60 tablet 1  . lisinopril (PRINIVIL,ZESTRIL) 10 MG tablet Take 1 tablet (10  mg total) by mouth daily. 30 tablet 5  . nitroGLYCERIN (NITROSTAT) 0.4 MG SL tablet Place 1 tablet (0.4 mg total) under the tongue every 5 (five) minutes as needed for chest pain. 25 tablet 3  . pantoprazole (PROTONIX) 40 MG tablet Take 1 tablet (40 mg total) by mouth daily. 30 tablet 3  . potassium chloride SA (K-DUR,KLOR-CON) 20 MEQ tablet Take 1 tablet (20 mEq total) by mouth daily. 30 tablet 5  . prasugrel (EFFIENT) 10 MG TABS tablet Take 1 tablet (10 mg total) by mouth daily. 90 tablet 3  . simvastatin (ZOCOR) 40 MG tablet Take 1 tablet (40 mg total) by mouth daily. 30 tablet 5  . traMADol (ULTRAM) 50 MG tablet Take 1 tablet (50 mg total) by mouth every 8 (eight) hours as needed. (Patient taking differently: Take 50 mg by mouth every 8 (eight) hours as needed for moderate pain. ) 60 tablet 1  . traZODone (DESYREL) 50 MG tablet Take 0.5-1 tablets (25-50 mg total) by mouth at bedtime as needed for sleep. 30 tablet 1   No current facility-administered medications for this visit.     Allergies:   Review of patient's allergies indicates no known allergies.   Social History:  The patient  reports that she  has been smoking.  She does not have any smokeless tobacco history on file. She reports that she drinks alcohol. She reports that she does not use illicit drugs. Smokes 1/2 ppd    Family History:  The patient's family history includes CAD in her mother; Cancer - Lung in her father and sister; Diabetes type II in her mother and sister; Heart attack in her mother, sister, and another family member; Heart failure in her mother; Hypertension in her mother, sister, and son.   ROS:  Please see the history of present illness.   She feels nervous sometimes.   All other systems reviewed and negative.    PHYSICAL EXAM: VS:  BP 110/62 mmHg  Pulse 75  Ht  (1.727 m)  Wt 189 lb (85.73 kg)  BMI 28.74 kg/m2  SpO2 98% Well nourished, well developed, in no acute distress HEENT: normal Neck:  no  JVD Cardiac:  normal S1, S2;  RRR; no murmur Lungs:   clear to auscultation bilaterally, no wheezing, rhonchi or rales Abd: soft, nontender, no hepatomegaly Ext:  no edemaright wrist without hematoma or mass  Skin: warm and dry Neuro:  CNs 2-12 intact, no focal abnormalities noted  EKG:  NSR, HR 71, normal axis, low voltage, 1st degree AVB, no significant ST changes.      ASSESSMENT AND PLAN:  1.  Coronary artery disease:  No symptoms to sugg angina      -  Continue ASA, Effient. OK to switch to plavix if needed      -  Decrease ASA to 81 mg QD.    -  Continue statin.  Labs done today      -  Beta blocker was not used due to bradycardia.    -    2.  Cardiomyopathy:  EF was normal by cardiac cath.  No beta blocker was used due to bradycardia.      -  Continue ACEI. 3.  Essential hypertension:  BP controlled.   4.  HLD (hyperlipidemia):  Continue Simvastatin.   Labs today     5.  Hypothyroidism, unspecified hypthyroidism type:  TSH elevated in the hospital.  FU with primary care.   6.  Tob  Counselled on importance of cessation.

## 2014-04-24 ENCOUNTER — Encounter: Payer: Self-pay | Admitting: Family Medicine

## 2014-04-24 ENCOUNTER — Encounter: Payer: Self-pay | Admitting: Internal Medicine

## 2014-04-24 ENCOUNTER — Ambulatory Visit (INDEPENDENT_AMBULATORY_CARE_PROVIDER_SITE_OTHER): Payer: Medicaid Other | Admitting: Internal Medicine

## 2014-04-24 ENCOUNTER — Ambulatory Visit: Payer: Medicaid Other | Attending: Family Medicine | Admitting: Family Medicine

## 2014-04-24 VITALS — BP 106/71 | HR 74 | Temp 98.3°F | Resp 16 | Ht 68.0 in | Wt 190.0 lb

## 2014-04-24 VITALS — BP 110/62 | HR 75 | Ht 68.0 in | Wt 189.0 lb

## 2014-04-24 DIAGNOSIS — I10 Essential (primary) hypertension: Secondary | ICD-10-CM

## 2014-04-24 DIAGNOSIS — E039 Hypothyroidism, unspecified: Secondary | ICD-10-CM | POA: Diagnosis present

## 2014-04-24 DIAGNOSIS — R5383 Other fatigue: Secondary | ICD-10-CM | POA: Diagnosis not present

## 2014-04-24 DIAGNOSIS — F17210 Nicotine dependence, cigarettes, uncomplicated: Secondary | ICD-10-CM | POA: Insufficient documentation

## 2014-04-24 DIAGNOSIS — F411 Generalized anxiety disorder: Secondary | ICD-10-CM

## 2014-04-24 DIAGNOSIS — E785 Hyperlipidemia, unspecified: Secondary | ICD-10-CM

## 2014-04-24 DIAGNOSIS — G478 Other sleep disorders: Secondary | ICD-10-CM

## 2014-04-24 DIAGNOSIS — A630 Anogenital (venereal) warts: Secondary | ICD-10-CM

## 2014-04-24 DIAGNOSIS — Z72821 Inadequate sleep hygiene: Secondary | ICD-10-CM | POA: Insufficient documentation

## 2014-04-24 DIAGNOSIS — I2510 Atherosclerotic heart disease of native coronary artery without angina pectoris: Secondary | ICD-10-CM

## 2014-04-24 DIAGNOSIS — E559 Vitamin D deficiency, unspecified: Secondary | ICD-10-CM

## 2014-04-24 DIAGNOSIS — Z720 Tobacco use: Secondary | ICD-10-CM

## 2014-04-24 LAB — VITAMIN B12: VITAMIN B 12: 393 pg/mL (ref 211–911)

## 2014-04-24 LAB — COMPLETE METABOLIC PANEL WITH GFR
ALK PHOS: 76 U/L (ref 39–117)
ALT: 11 U/L (ref 0–35)
AST: 13 U/L (ref 0–37)
Albumin: 4.2 g/dL (ref 3.5–5.2)
BILIRUBIN TOTAL: 0.4 mg/dL (ref 0.2–1.2)
BUN: 16 mg/dL (ref 6–23)
CALCIUM: 9 mg/dL (ref 8.4–10.5)
CO2: 26 meq/L (ref 19–32)
Chloride: 104 mEq/L (ref 96–112)
Creat: 0.82 mg/dL (ref 0.50–1.10)
GFR, Est African American: >89 mL/min
GFR, Est Non African American: 79 mL/min
GLUCOSE: 89 mg/dL (ref 70–99)
POTASSIUM: 4.3 meq/L (ref 3.5–5.3)
Sodium: 138 mEq/L (ref 135–145)
Total Protein: 6.7 g/dL (ref 6.0–8.3)

## 2014-04-24 LAB — LIPID PANEL
Cholesterol: 178 mg/dL (ref 0–200)
HDL: 39 mg/dL — AB (ref >39)
LDL Cholesterol: 103 mg/dL — ABNORMAL HIGH (ref 0–99)
Total CHOL/HDL Ratio: 4.6 Ratio
Triglycerides: 181 mg/dL — ABNORMAL HIGH (ref <150)
VLDL: 36 mg/dL (ref 0–40)

## 2014-04-24 LAB — TSH: TSH: 0.217 u[IU]/mL — AB (ref 0.350–4.500)

## 2014-04-24 MED ORDER — TRAZODONE HCL 50 MG PO TABS: 25.0000 mg | ORAL_TABLET | Freq: Every evening | ORAL | Status: DC | PRN

## 2014-04-24 NOTE — Assessment & Plan Note (Signed)
A: persistent anogenital warts P: treated with cryotherapy. Consider surgical referral for excision depending on response to therapy

## 2014-04-24 NOTE — Assessment & Plan Note (Addendum)
A: supplementing with synthroid. Patient gaining weight but she is taking lexapro which is associated with some weight gain.  Lab Results  Component Value Date   TSH 18.236* 02/02/2014  P; Repeat TSH with synthroid dose adjustment as needed  Lab Results  Component Value Date   TSH 0.217* 04/24/2014  will decrease synthroid to 112 mcg q AM

## 2014-04-24 NOTE — Assessment & Plan Note (Addendum)
Fatigue with frequent night time wakening B12 and vit D level check Adding trazodone 25-50 mg nightly. Discussed sleep hygiene. Consider wean off Wellbutrin which is prescribed for smoking cessation since patient has reached a plateau of 1/2 PPD

## 2014-04-24 NOTE — Patient Instructions (Addendum)
Mrs. Diana Erickson,  Thank you for coming back in to see me today.  1. Hypothyroidism; TSH today, goal is 0.5-4.5 I will adjust your dose and [facility] [place].   2. Fatigue with frequent night time wakening B12 and vit D level check Adding trazodone 25-50 mg nightly.  3. Planning tooth extraction: I recommend holding effient and aspirin 3 days prior to extractions and will be happy to provided written recommendations to your dentist.   F/u in 2-3 weeks for cryotherapy and readdress insomnia F/u in 8 weeks for hypothyroidism   Dr. Armen PickupFunches

## 2014-04-24 NOTE — Patient Instructions (Signed)
Your physician wants you to follow-up in: about 6 months. You will receive a reminder letter in the mail two months in advance. If you don't receive a letter, please call our office to schedule the follow-up appointment.

## 2014-04-24 NOTE — Progress Notes (Signed)
Pt is here following up on her hyperlipidemia and her HTN. Pt is here to check her labs. Pt is fasting. Pt had cryotherapy on her last visit and is here to check on the healing process.

## 2014-04-24 NOTE — Progress Notes (Signed)
   Subjective:    Patient ID: Diana CagePatricia Erickson, female    DOB: [birth year], 59 y.o.   MRN: [id number] CC: f/u hypothyroidism, cryotherapy for genital warts  HPI 59 yo F with CAD, smoker, chronic anxiety presents for f/u exam:  1. Hypothyroidism: compliant with synthroid q AM on empty stomach. Admits to weight gain and fatigue. Denies palpitations, edema, cold intolerance.   2. Fatigue: persistent with poor sleep pattern. Wakes up 2-3 times per night. Smokes before bed. When she is up she uses the restroom and watches TV. Denies SOB at night and snoring. She sleeps with boyfriend. She reports that her bedroom is peaceful.   3. Genital warts: persistent. Some pain after last cryotherapy. No bleeding, discharge or new lesions. Would like repeat cryotherapy today.   Soc Hx: current smoker 1/2 PPD  Review of Systems As per HPI     Objective:   Physical Exam BP 106/71 mmHg  Pulse 74  Temp(Src) 98.3 F (36.8 C) (Oral)  Resp 16  Ht 5\' 8"  (1.727 m)  Wt 190 lb (86.183 kg)  BMI 28.90 kg/m2  SpO2 99%  Wt Readings from Last 3 Encounters:  04/24/14 190 lb (86.183 kg)  03/28/14 187 lb (84.823 kg)  03/01/14 184 lb (83.462 kg)  General appearance: alert, cooperative and no distress  Neck: thyroid normal, non palpable, non tender  Pelvic: flat wart on L labia minora, protruding cluster of warts from perineum and rectum, no bleeding or ulceration  Cryotherapy done x 3 freeze and thaw cycles to genital warts   Lab Results  Component Value Date   TSH 18.236* 02/02/2014        Assessment & Plan:

## 2014-04-25 ENCOUNTER — Telehealth: Payer: Self-pay | Admitting: *Deleted

## 2014-04-25 DIAGNOSIS — E559 Vitamin D deficiency, unspecified: Secondary | ICD-10-CM | POA: Insufficient documentation

## 2014-04-25 LAB — VITAMIN D 25 HYDROXY (VIT D DEFICIENCY, FRACTURES): Vit D, 25-Hydroxy: 27 ng/mL — ABNORMAL LOW (ref 30–100)

## 2014-04-25 MED ORDER — CALCIUM CITRATE-VITAMIN D 500-400 MG-UNIT PO CHEW: 1.0000 | CHEWABLE_TABLET | Freq: Two times a day (BID) | ORAL | Status: DC

## 2014-04-25 MED ORDER — LEVOTHYROXINE SODIUM 112 MCG PO TABS: 112.0000 ug | ORAL_TABLET | Freq: Every day | ORAL | Status: DC

## 2014-04-25 MED ORDER — ATORVASTATIN CALCIUM 40 MG PO TABS: 40.0000 mg | ORAL_TABLET | Freq: Every day | ORAL | Status: DC

## 2014-04-25 NOTE — Telephone Encounter (Signed)
-----   Message from Lora PaulaJosalyn C Funches, MD sent at 04/25/2014  9:47 AM EST ----- Normal CMP (liver enzymes) and B12.  TSH is now a bit on the low side (over treated) Vit D a bit low, vit D insufficiency. LDL and TGs a bit high and HDL a bit low.  Plan:  Vit D3 with calcium daily  Stop simvastatin and start lipitor 40 mg daily (more potent statin) Change synthroid from 125 mcg in the AM to 112 mcg (half way between current and previous synthroid dose) with plan for f/u TSH in 8-12 weeks.

## 2014-04-25 NOTE — Assessment & Plan Note (Signed)
A: vit D insufficiency P: vit D3 plus calcium daily supplement

## 2014-04-25 NOTE — Telephone Encounter (Signed)
Pt aware of lab result, and medicine changes

## 2014-04-25 NOTE — Assessment & Plan Note (Signed)
A: Normal CMP (liver enzymes), LDL and TGs a bit high and HDL a bit low.  P:Stop simvastatin and start lipitor 40 mg daily (more potent statin)

## 2014-04-25 NOTE — Addendum Note (Signed)
Addended by: Dessa PhiFUNCHES, Jaskiran Pata on: 04/25/2014 09:58 AM   Modules accepted: Orders, Medications

## 2014-04-27 ENCOUNTER — Telehealth: Payer: Self-pay | Admitting: Internal Medicine

## 2014-04-27 NOTE — Telephone Encounter (Signed)
New msg      1. What dental office are you calling from? Pt calling   2. What is your office phone and fax number? Dr. Clarisa FlingBattle's email is  [email or web address] , office # is [phone]  3. What type of procedure is the patient having performed? Teeth extraction   4. What date is procedure scheduled? No date scheduled until Dr. Tenny Crawoss sends consent  5. What is your question (ex. Antibiotics prior to procedure, holding medication-we need to know how long dentist wants pt to hold med)? Pt is on blood thinner and needs Dr. Tenny Crawoss to send fax/email to consent having extractions completed while pt is on blood thinners.   Please call pt in regards to this. 6.

## 2014-04-27 NOTE — Telephone Encounter (Signed)
New Message     Patient is calling back to let nurse know that when she sends the consent papers make sure to put her Last name in the Subject of the email so they will be able to identify her if you have not already sent the consent paper.  Please give patient a return call

## 2014-04-27 NOTE — Telephone Encounter (Signed)
Patient states she needs to have 7 teeth extracted. Is on Effient 10 mg. Post STEMI with DES on 01/30/2014. DDS needs instructions. .Marland Kitchen

## 2014-04-28 ENCOUNTER — Telehealth: Payer: Self-pay | Admitting: Family Medicine

## 2014-04-28 ENCOUNTER — Telehealth: Payer: Self-pay | Admitting: *Deleted

## 2014-04-28 DIAGNOSIS — Z720 Tobacco use: Secondary | ICD-10-CM

## 2014-04-28 MED ORDER — BUPROPION HCL ER (SR) 150 MG PO TB12: 150.0000 mg | ORAL_TABLET | Freq: Two times a day (BID) | ORAL | Status: DC

## 2014-04-28 NOTE — Addendum Note (Signed)
Addended by: Dessa PhiFUNCHES, [place] Pflum on: 04/28/2014 06:39 PM   Modules accepted: Orders

## 2014-04-28 NOTE — Telephone Encounter (Signed)
Patient called to request a medication refill for buPROPion (WELLBUTRIN SR) 150 MG 12 hr tablet. Please f/u with pt.

## 2014-04-28 NOTE — Telephone Encounter (Signed)
Expand All Collapse All   Patient called to request a medication refill for buPROPion (WELLBUTRIN SR) 150 MG 12 hr tablet. Please f/u with pt.              Is it ok to reorder?

## 2014-04-30 NOTE — Telephone Encounter (Signed)
Patinet should not stop Effient until after October 2016  Had drug eluting stent placed last October  Needs 1 year. Of uninterrupted use

## 2014-05-01 NOTE — Telephone Encounter (Signed)
Placed encounter notes in nurse fax bin in medical records to be faxed to dental office.  Fax # is [phone].

## 2014-05-02 ENCOUNTER — Telehealth: Payer: Self-pay | Admitting: Internal Medicine

## 2014-05-02 NOTE — Telephone Encounter (Signed)
New message      What dental office are you calling from? Dr Battle----(pt is calling) What is your office phone and fax number? Fax [phone] 1. What type of procedure is the patient having performed? extraction  What date is procedure scheduled? Not scheduled 2. What is your question (ex. Antibiotics prior to procedure, holding medication-we need to know how long dentist wants pt to hold med)? Need note saying pt cannot stop blood thinner but still pull teeth

## 2014-05-04 NOTE — Telephone Encounter (Signed)
Follow up    Patient calling stating she has not heard anything .    1. What dental office are you calling from? Patient calling    2. What is your office phone and fax number? Dr. Catalina AntiguaBattle  - [phone]   3. What type of procedure is the patient having performed? Extraction    4. What date is procedure scheduled? Pending - office waiting on form   5. What is your question (ex. Antibiotics prior to procedure, holding medication-we need to know how long dentist wants pt to hold med)? effient

## 2014-05-04 NOTE — Telephone Encounter (Signed)
Fax was sent on 1/19 but Dr. Clarisa FlingBattle's office did not receive fax. Resent 1/21 called Dr. Hyman BibleBattles office to confirm and they stated fax machine is down.  Informed we will mail, attention [place].  Mailing out today.  Pt made aware of plan and agrees.

## 2014-05-04 NOTE — Telephone Encounter (Signed)
Follow up       Please fax note saying it is ok to stay of effient during extraction.  Fax note to [phone] attn Dr Catalina AntiguaBattle

## 2014-05-10 ENCOUNTER — Telehealth: Payer: Self-pay | Admitting: Internal Medicine

## 2014-05-10 NOTE — Telephone Encounter (Signed)
New message    1. What dental office are you calling from? Patient calling    2. What is your office phone and fax number? Dr. Catalina AntiguaBattle office # [phone].  3. What type of procedure is the patient having performed? Extraction    4. What date is procedure scheduled? 1.29.2016   5. What is your question (ex. Antibiotics prior to procedure, holding medication-we need to know how long dentist wants pt to hold med)?  Has questions regarding blood thinner & clearance

## 2014-05-10 NOTE — Telephone Encounter (Signed)
May bleed more.  Needs to stay on Effient

## 2014-05-10 NOTE — Telephone Encounter (Signed)
Spoke with patient. Last week the dental office was informed that patient cannot stop effient until after October 2016. She is to have 7 teeth extracted this Friday.  Pt states the dental office (Brittney at Dr. Clarisa FlingBattle's office) [phone], needs to know if it is okay with Dr. Tenny Crawoss if patient has the extraction ON the effient. Per patient this is planned to be local anesthesia in the dental office.  Advised patient that she should not stop blood thinner.  It is the dentist's decision whether he wants to remove teeth while on Effient.  She asked me to discuss with Brittney at their office.  Spoke with Dr. Clarisa FlingBattle's office, they request that Dr. Tenny Crawoss let them know if she thinks it is ok for the dentist to pull her teeth while on Effient. They are aware that I will forward the information to Dr. Tenny Crawoss and will fax them her reply.  She is scheduled for Friday, Jan 29.

## 2014-05-11 NOTE — Telephone Encounter (Signed)
Received call from Brittney, Dr. Clarisa FlingBattle's office. She states the patient has not been seen by them yet. She is coming tomorrow for consultation. They do not have a plan yet or know what the patient will need, but they want a consent to treat patient while on blood thinner.  I explained that again that patient cannot stop Effient until after Oct 2016 due to her stent. I advised that whomever is doing the dental work should determine whether it is safe for patient to have it done while on the blood thinner.  When the patient comes tomorrow for consultation, and they have a treatment plan, they will call back and at that time ask for consent from Dr. Tenny Crawoss to do the dental work while on effient.

## 2014-05-16 ENCOUNTER — Telehealth: Payer: Self-pay | Admitting: Internal Medicine

## 2014-05-16 NOTE — Telephone Encounter (Signed)
New Msg      1. What dental office are you calling from? Dr. Catalina AntiguaBattle   2. What is your office phone and fax number? Office phn # [phone], office  Fax # [phone]  3. What type of procedure is the patient having performed? Extractions on 05/18/14 at 8 am.   4. What date is procedure scheduled? 05/18/14 at 8 am   5. What is your question (ex. Antibiotics prior to procedure, holding medication-we need to know how long dentist wants pt to hold med)? Pt needs a letter stating it is ok to have extractions completed while on blood thinner.   Pt doesn't need to hold any meds, but Dentist is requesting letter to acutally state that is is ok to have teeth extracted while on bloodthinner.  Pt will also be starting antibiotic. Please contact Victorino DikeJennifer at Dr. Clarisa FlingBattle's office with info.

## 2014-05-17 ENCOUNTER — Encounter: Payer: Self-pay | Admitting: Internal Medicine

## 2014-05-18 ENCOUNTER — Telehealth: Payer: Self-pay | Admitting: Family Medicine

## 2014-05-18 NOTE — Telephone Encounter (Signed)
Patient is calling stating that she had 7 teeth pulled this morning but she is continuously bleeding, patient states that she called the dental office where she had the procedure done but they are not giving much other advice. Please f/u with pt.

## 2014-05-22 NOTE — Telephone Encounter (Signed)
Discussed this matter with Dr. Tenny Crawoss on 2/4. She states she forwarded a note already that patient needs to remain on blood thinner, will probably ooze a little more, but she cannot stop the blood thinner.  If the dentist needs to extract her teeth, that is up to him, she is only statinig that patient needs to remain on the effient.  Per operator Dental office [facility](Brittney) called to speak with Dr. Tenny Crawoss while patient was in chair.  They wanted her ok that the dentist could do extractions on effient.    Had Brittney on hold during clinic.  She hung up before we could answer her call.

## 2014-06-05 ENCOUNTER — Other Ambulatory Visit: Payer: Self-pay | Admitting: Family Medicine

## 2014-06-05 DIAGNOSIS — E039 Hypothyroidism, unspecified: Secondary | ICD-10-CM

## 2014-06-05 MED ORDER — LEVOTHYROXINE SODIUM 112 MCG PO TABS: 112.0000 ug | ORAL_TABLET | Freq: Every day | ORAL | Status: DC

## 2014-06-06 ENCOUNTER — Telehealth: Payer: Self-pay | Admitting: *Deleted

## 2014-06-06 NOTE — Telephone Encounter (Signed)
Patient called in saying she was going to run out of her prescribed Xanax Patient states she has been taking tid q day Explained to patient that this medication is to be taken PRN and if she is Needing to take it that much every day that [facility] Patient states that she does not want to go and that she will make an appointment  To see Dr. Sidney AceFunchess soon

## 2014-06-19 ENCOUNTER — Ambulatory Visit: Payer: Medicaid Other | Attending: Family Medicine | Admitting: Family Medicine

## 2014-06-19 ENCOUNTER — Encounter: Payer: Self-pay | Admitting: Family Medicine

## 2014-06-19 VITALS — BP 119/82 | HR 72 | Temp 98.1°F | Resp 16 | Ht 68.0 in | Wt 183.0 lb

## 2014-06-19 DIAGNOSIS — E039 Hypothyroidism, unspecified: Secondary | ICD-10-CM

## 2014-06-19 DIAGNOSIS — M79672 Pain in left foot: Secondary | ICD-10-CM | POA: Insufficient documentation

## 2014-06-19 DIAGNOSIS — M722 Plantar fascial fibromatosis: Secondary | ICD-10-CM

## 2014-06-19 DIAGNOSIS — K219 Gastro-esophageal reflux disease without esophagitis: Secondary | ICD-10-CM

## 2014-06-19 DIAGNOSIS — G894 Chronic pain syndrome: Secondary | ICD-10-CM

## 2014-06-19 DIAGNOSIS — F411 Generalized anxiety disorder: Secondary | ICD-10-CM

## 2014-06-19 LAB — TSH: TSH: 0.91 u[IU]/mL (ref 0.350–4.500)

## 2014-06-19 MED ORDER — TRAZODONE HCL 100 MG PO TABS: 100.0000 mg | ORAL_TABLET | Freq: Every evening | ORAL | Status: DC | PRN

## 2014-06-19 MED ORDER — ALPRAZOLAM 0.25 MG PO TABS: 0.2500 mg | ORAL_TABLET | Freq: Three times a day (TID) | ORAL | Status: DC | PRN

## 2014-06-19 MED ORDER — "ADJUSTABLE ALUMINUM CANE 3/4"" MISC": 1.0000 | Freq: Every day | Status: DC

## 2014-06-19 MED ORDER — BUPROPION HCL ER (SR) 150 MG PO TB12: 150.0000 mg | ORAL_TABLET | Freq: Two times a day (BID) | ORAL | Status: DC

## 2014-06-19 MED ORDER — ESCITALOPRAM OXALATE 20 MG PO TABS: 20.0000 mg | ORAL_TABLET | Freq: Every day | ORAL | Status: DC

## 2014-06-19 MED ORDER — IBUPROFEN 600 MG PO TABS: 600.0000 mg | ORAL_TABLET | Freq: Three times a day (TID) | ORAL | Status: AC

## 2014-06-19 MED ORDER — ALPRAZOLAM 0.25 MG PO TABS: 0.2500 mg | ORAL_TABLET | Freq: Two times a day (BID) | ORAL | Status: DC | PRN

## 2014-06-19 MED ORDER — ACETAMINOPHEN-CODEINE #3 300-30 MG PO TABS: 1.0000 | ORAL_TABLET | Freq: Three times a day (TID) | ORAL | Status: DC | PRN

## 2014-06-19 MED ORDER — PANTOPRAZOLE SODIUM 40 MG PO TBEC: 40.0000 mg | DELAYED_RELEASE_TABLET | Freq: Every day | ORAL | Status: DC

## 2014-06-19 NOTE — Progress Notes (Signed)
   Subjective:    Patient ID: Diana CagePatricia Erickson, female    DOB: Oct 06, 1955, 59 y.o.   MRN: [id number] CC: L heel pain, f/u hypothyroidism  HPI 59 yo F presents for f/u:  1. L heel pain: acute onset on 06/14/14. L heel pain radiating up Achilles. Patient went to [facility] [place]. Had a normal exam and x-ray. Has records. Pain is worse with standing on heel. No trauma. No swelling or redness. Having trouble walking without assistance. Requesting cane, home health for assistance with ADLs (mostly cleaning and meal prep), temporary handicap placard.   2. Hypothyroidism: taking synthroid. Due to TSH recheck.   Soc Hx: current smoker  Review of Systems As per HPI     Objective:   Physical Exam BP 119/82 mmHg  Pulse 72  Temp(Src) 98.1 F (36.7 C) (Oral)  Resp 16  Ht 5\' 8"  (1.727 m)  Wt 183 lb (83.008 kg)  BMI 27.83 kg/m2  SpO2 100% General appearance: alert, cooperative and no distress Extremities: extremities normal, atraumatic, no cyanosis or edema, no L heel redness, tenderness or trauma.   Reviewed x-ray or L foot from 06/15/14. No fracture, dislocation or soft tissue edema.      Assessment & Plan:

## 2014-06-19 NOTE — Progress Notes (Signed)
F/U thyroid problems Stated was at [facility] in [place] due to pain on lt heal  Home health care form

## 2014-06-19 NOTE — Assessment & Plan Note (Addendum)
2. Hypothyroidism: checking TSH today.  Normal TSH, refilled synthroid at current dose  Lab Results  Component Value Date   TSH 0.910 06/19/2014

## 2014-06-19 NOTE — Assessment & Plan Note (Signed)
1. L heel pain: I suspect plantar fascitis Plan: Ibuprofen 3 times daily with food for next 5 days only for inflammation Change from tramadol to tylenol #3 for pain CAM walker and cane Rx Podiatry referral

## 2014-06-19 NOTE — Patient Instructions (Signed)
Ms. Diana Erickson,  Thank you for coming in today.  1. L heel pain: I suspect plantar fascitis Plan: Ibuprofen 3 times daily with food for next 5 days only for inflammation Change from tramadol to tylenol #3 for pain CAM walker and cane Rx Podiatry referral  2. Hypothyroidism: checking TSH today.  F/u in 6 weeks   Dr. Armen PickupFunches

## 2014-06-20 MED ORDER — LEVOTHYROXINE SODIUM 112 MCG PO TABS
112.0000 ug | ORAL_TABLET | Freq: Every day | ORAL | Status: DC
Start: 2014-06-20 — End: 2015-04-17

## 2014-06-20 NOTE — Addendum Note (Signed)
Addended by: Dessa PhiFUNCHES, Zyniah Ferraiolo on: 06/20/2014 08:31 AM   Modules accepted: Orders

## 2014-06-21 ENCOUNTER — Telehealth: Payer: Self-pay | Admitting: Family Medicine

## 2014-06-21 NOTE — Telephone Encounter (Signed)
Pt called requesting lab results, please f/u with pt

## 2014-06-21 NOTE — Telephone Encounter (Signed)
Home health form filled out. Please call patient to get fax # and phone # of agency as I accidentally discarded the paper I wrote it down on.

## 2014-06-21 NOTE — Telephone Encounter (Signed)
-----   Message from Dessa PhiJosalyn Funches, MD sent at 06/20/2014  8:30 AM EST ----- Normal TSH, refilled synthroid at current dose

## 2014-06-21 NOTE — Telephone Encounter (Signed)
Pt ware of results Notified home health care for fax to [phone] Att Elease HashimotoAlisha

## 2014-06-21 NOTE — Telephone Encounter (Signed)
Pt requesting TSH results. Please f/u.

## 2014-06-22 ENCOUNTER — Telehealth: Payer: Self-pay | Admitting: Family Medicine

## 2014-06-22 NOTE — Telephone Encounter (Signed)
Pt states that she's been taking T3 and Ibuprofen 600mg  for her pain but it upsets her stomach. She would like to know if there is something else that can be prescribed for her. Please f/u with pt.

## 2014-07-06 ENCOUNTER — Ambulatory Visit: Payer: Self-pay

## 2014-07-10 ENCOUNTER — Ambulatory Visit (INDEPENDENT_AMBULATORY_CARE_PROVIDER_SITE_OTHER): Payer: Medicaid Other

## 2014-07-10 ENCOUNTER — Telehealth: Payer: Self-pay | Admitting: Internal Medicine

## 2014-07-10 VITALS — BP 128/77 | HR 81 | Resp 18

## 2014-07-10 DIAGNOSIS — M722 Plantar fascial fibromatosis: Secondary | ICD-10-CM | POA: Diagnosis not present

## 2014-07-10 DIAGNOSIS — M7752 Other enthesopathy of left foot: Secondary | ICD-10-CM

## 2014-07-10 DIAGNOSIS — R52 Pain, unspecified: Secondary | ICD-10-CM

## 2014-07-10 DIAGNOSIS — M7732 Calcaneal spur, left foot: Secondary | ICD-10-CM

## 2014-07-10 MED ORDER — TRIAMCINOLONE ACETONIDE 10 MG/ML IJ SUSP: 10.0000 mg | Freq: Once | INTRAMUSCULAR | Status: DC

## 2014-07-10 NOTE — Telephone Encounter (Signed)
Spoke with pt. She reports shortness of breath for last week.  Worse with exertion. No ankle/leg swelling.  No chest pain.  Has had shortness of breath off and on since stent placed but feels this is worse.  Sleeps poorly and always sleeps with head elevated on pillows.  I offered pt appt tomorrow with PA in office but she is unable to be here for 9 AM appt.  Requesting later appt.  Appt made for pt to see Norma FredricksonLori Gerhardt, NP on July 12, 2014 at 10:30.  Pt aware if symptoms worsen she should go to ED for evaluation.

## 2014-07-10 NOTE — Patient Instructions (Addendum)
ICE INSTRUCTIONS  Apply ice or cold pack to the affected area at least 3 times a day for 10-15 minutes each time.  You should also use ice after prolonged activity or vigorous exercise.  Do not apply ice longer than 20 minutes at one time.  Always keep a cloth between your skin and the ice pack to prevent burns.  Being consistent and following these instructions will help control your symptoms.  We suggest you purchase a gel ice pack because they are reusable and do bit leak.  Some of them are designed to wrap around the area.  Use the method that works best for you.  Here are some other suggestions for icing.   Use a frozen bag of peas or corn-inexpensive and molds well to your body, usually stays frozen for 10 to 20 minutes.  Wet a towel with cold water and squeeze out the excess until it's damp.  Place in a bag in the freezer for 20 minutes. Then remove and use.   Recommendations for shoes obtain a thick soled athletic or walking shoes such as a new balance or ASICS running shoe. No barefoot no flimsy shoes or flip-flops. Around the house recommend using crocs type clog at all times avoid going barefoot.

## 2014-07-10 NOTE — Telephone Encounter (Signed)
New Msg       Pt c/o Shortness Of Breath: STAT if SOB developed within the last 24 hours or pt is noticeably SOB on the phone  1. Are you currently SOB (can you hear that pt is SOB on the phone)? yes 2. How long have you been experiencing SOB? One week 3. Are you SOB when sitting or when up moving around? At all times, worse when walking. 4.  Are you currently experiencing any other symptoms? No    Please return pt call.

## 2014-07-10 NOTE — Progress Notes (Signed)
Subjective:    Patient ID: Diana Erickson, female    DOB: 12-Oct-1955, 59 y.o.   MRN: [id number]  HPI I AM HAVING SOME LEFT HEEL PAIN AND HAS BEEN GOING ON FOR ABOUT A MONTH AND BURNS AND THROBS AND WENT TO MY FAMILY DOCTOR AND THEY GAVE ME A CANE AND A BOOT AND HURTS TO WALK ON IT AND IS SORE AND TENDER AND THEN WENT TO URGENT CARE AND THEY SENT ME TO THE HOSPITAL FOR X-RAYS AND I BROKE MY RIGHT LEG BACK IN 2011    Review of Systems  Cardiovascular: Positive for chest pain.  Musculoskeletal: Positive for gait problem.       Objective:   Physical Exam 59 year old white female well-developed well-nourished oriented 3 presents this time with a just over a month history of left heel pain pain on medial calcaneal medial band plantar fascia some tightness and lateral sinus tarsi area as well lateral ankle and possibly to compensatory gait changes patient was treated with pain medications were given Tylenol with codeine also was placed on the boot and a cane been applying some ice. The right foot slightly tender however the left F and tender along the medial band plantar fascia medial calcaneal tubercle area mid arch of the plantar fascia to calcaneal tubercle. Pain on first up in the morning or getting a fall. Of rest. X-rays reviewed reveal mild inferior calcaneal spurring thickening plantar fascial structures no fracturing mild subluxation Lisfranc's joint no cysts tumors or osseous abnormalities otherwise identified rectus foot noted on all 3 views left foot. On exam at this time there is pain on palpation of the medial band plantar fascia medial calcaneal tubercle x-rays again reviewed the shoes are examined noted to be extremely flimsy no support no structures a bending mid arch mid recommendation for a good stable walking or athletic shoe new balance Brooks or ASICS or consider crocs for around the house. The exam reveals ask her status to be intact pedal pulses palpable DP +2 PT plus one over 4  Refill time 3 seconds all digits epicritic and proprioceptive sensations intact and symmetric bilateral there is normal plantar response DTRs not elicited dermatologically skin color pigment normal hair growth absent nails criptotic otherwise unremarkable no open wounds no ulcers no secondary infections.       Assessment & Plan:  Assessment plantar fasciitis/heel spur syndrome left foot plan at this time fascial injection tendons Kenalog 20 mg Xylocaine plain infiltrated the inferior calcaneal tubercle fascial insertion of left heel patient is placed in fascial strapping left foot 5 days as instructed recommended shoes and socks at all times crocs for around the house avoid going barefoot at any time also recommend ice to the heel every evening I take Tylenol needed for pain indicates she has some Tylenol with codeine if needed. Patient this time will wear the taping for 5 days as instructed reevaluate in 2 weeks for reassessment may be candidate for OTC orthotics at some point in future based on progress did stressed the importance of a good stable shoe at all times. Patient did have paperwork that she requested for me to sign regarding a home nursing or home nursing assistance her home care assistant I advised that there is no reason for foot or lower extremity standpoint that she would be any kind of assistance at this point she certainly has full functionality other than having some pain fascial strain one problem that should be self-limiting and resolve with appropriate care and treatment.  However from a cardiovascular standpoint patient Kaser her other doctor is RE sinus papers eyes that there is nothing more that I can add to warrant any home care nursing  Alvan Dameichard Adedamola Seto DPM

## 2014-07-12 ENCOUNTER — Ambulatory Visit: Payer: Medicaid Other | Admitting: Nurse Practitioner

## 2014-07-24 ENCOUNTER — Telehealth: Payer: Self-pay | Admitting: *Deleted

## 2014-07-24 ENCOUNTER — Ambulatory Visit: Payer: Medicaid Other

## 2014-07-24 NOTE — Telephone Encounter (Signed)
Patient called in requesting refill on levothyroxine 112 mcg to [facility] Patient notified that PCP e-scribed levothyroxine 112 mcg #90 with 3 refills to [facility] on 06/20/14

## 2014-07-27 ENCOUNTER — Ambulatory Visit (INDEPENDENT_AMBULATORY_CARE_PROVIDER_SITE_OTHER): Payer: Medicaid Other | Admitting: Podiatrist

## 2014-07-27 VITALS — BP 124/64 | HR 71 | Resp 18

## 2014-07-27 DIAGNOSIS — M722 Plantar fascial fibromatosis: Secondary | ICD-10-CM

## 2014-07-27 DIAGNOSIS — M7732 Calcaneal spur, left foot: Secondary | ICD-10-CM

## 2014-07-27 NOTE — Progress Notes (Signed)
   Subjective: Patient presents today for continued left heel pain. She relates that the pain is on the plantar aspect of the heel as well as on the dorsal lateral aspect of the heel. No new changes are noted in health history or medications.  Objective: Neurovascular status is intact with palpable pedal pulses and neurological sensation intact. Pain on palpation plantar medial aspect of the left heel is noted consistent with plantar fasciitis symptomatology. Compensatory pain on the dorsal lateral aspect of the left foot is also noted. Generalized plantar fasciitis symptomatology is elicited.  Assessment: Plantar fasciitis left  Plan: Injected the plantar fascia with Kenalog and Marcaine mixture. Plantar fascial braces dispensed Started her on a Dosepak of prednisone as well as Modic anti-inflammatory thereafter for 30 days. In forced to shoe gear changes that is it does not appear that she has listened to the previous advice and changed her shoe gear. Her plantar fasciitis is not out of the ordinary and should resolve in time. Disability and nursing requests are and will be denied

## 2014-07-27 NOTE — Patient Instructions (Signed)

## 2014-07-28 ENCOUNTER — Telehealth: Payer: Self-pay | Admitting: *Deleted

## 2014-07-28 MED ORDER — PREDNISONE 10 MG (21) PO TBPK: 10.0000 mg | ORAL_TABLET | Freq: Every day | ORAL | Status: DC

## 2014-07-28 MED ORDER — MELOXICAM 15 MG PO TABS: ORAL_TABLET | ORAL | Status: DC

## 2014-07-28 NOTE — Telephone Encounter (Addendum)
Pt states she was seen in , Dr. Lolita CramEgeron was to call 2 rxs into the [facility], but were not there at 500pm yesterday.  Dr. Irving ShowsEgerton ordered the medications today for yesterday, and I informed the pt the rxs would be available for her pick up today.

## 2014-08-11 ENCOUNTER — Other Ambulatory Visit: Payer: Self-pay | Admitting: *Deleted

## 2014-08-11 NOTE — Telephone Encounter (Signed)
Patient called in stating,"I need Dr. Armen PickupFunches to call me in amoxicillin." When asked why she need that particular medication she said she had a cold.  I explained that the doctor would not be calling in antibiotics without seeing her.  Told her she is welcome to make an appointment or come to our acute care walk in clinic-hours given

## 2014-09-04 DIAGNOSIS — M722 Plantar fascial fibromatosis: Secondary | ICD-10-CM

## 2014-09-19 ENCOUNTER — Other Ambulatory Visit: Payer: Self-pay | Admitting: Family Medicine

## 2014-09-19 DIAGNOSIS — I2510 Atherosclerotic heart disease of native coronary artery without angina pectoris: Secondary | ICD-10-CM

## 2014-09-19 DIAGNOSIS — F411 Generalized anxiety disorder: Secondary | ICD-10-CM

## 2014-09-19 DIAGNOSIS — I5022 Chronic systolic (congestive) heart failure: Secondary | ICD-10-CM

## 2014-09-19 MED ORDER — ALPRAZOLAM 0.25 MG PO TABS: 0.2500 mg | ORAL_TABLET | Freq: Three times a day (TID) | ORAL | Status: DC | PRN

## 2014-09-19 MED ORDER — LISINOPRIL 10 MG PO TABS: 10.0000 mg | ORAL_TABLET | Freq: Every day | ORAL | Status: DC

## 2014-09-19 MED ORDER — POTASSIUM CHLORIDE CRYS ER 20 MEQ PO TBCR: 20.0000 meq | EXTENDED_RELEASE_TABLET | Freq: Every day | ORAL | Status: DC

## 2014-09-19 NOTE — Telephone Encounter (Signed)
I spoke to patient, Refilled meds except: 1. Informed patient of clinic policy to no longer prescribe chronic BZ with chronic narcotic pain medicine  Patient has opted to d.c tylenol #3.  Called patient's pharmacy Refilled xanax D/cd tylenol #3

## 2014-09-19 NOTE — Telephone Encounter (Signed)
Patient called to request a med refill for:   ALPRAZolam (XANAX) 0.25 MG tablet acetaminophen-codeine (TYLENOL #3) 300-30 MG per tablet lisinopril (PRINIVIL,ZESTRIL) 10 MG tablet potassium chloride SA (K-DUR,KLOR-CON) 20 MEQ tablet  Patient uses [facility], please f/u with pt.

## 2014-10-27 ENCOUNTER — Other Ambulatory Visit: Payer: Self-pay | Admitting: Family Medicine

## 2014-11-03 ENCOUNTER — Ambulatory Visit (INDEPENDENT_AMBULATORY_CARE_PROVIDER_SITE_OTHER): Payer: Medicaid Other | Admitting: Nurse Practitioner

## 2014-11-03 ENCOUNTER — Ambulatory Visit
Admission: RE | Admit: 2014-11-03 | Discharge: 2014-11-03 | Disposition: A | Discharge status: Home / Self Care | Payer: Medicaid Other | Source: Ambulatory Visit | Attending: Nurse Practitioner | Admitting: Nurse Practitioner

## 2014-11-03 ENCOUNTER — Ambulatory Visit: Payer: Medicaid Other | Attending: Family Medicine | Admitting: Family Medicine

## 2014-11-03 ENCOUNTER — Encounter: Payer: Self-pay | Admitting: Family Medicine

## 2014-11-03 ENCOUNTER — Encounter: Payer: Self-pay | Admitting: Nurse Practitioner

## 2014-11-03 ENCOUNTER — Other Ambulatory Visit: Payer: Self-pay | Admitting: *Deleted

## 2014-11-03 VITALS — BP 140/90 | HR 82 | Ht 68.0 in | Wt 197.0 lb

## 2014-11-03 VITALS — BP 101/66 | HR 79 | Temp 99.1°F | Resp 16 | Ht 68.0 in | Wt 197.0 lb

## 2014-11-03 DIAGNOSIS — I509 Heart failure, unspecified: Secondary | ICD-10-CM | POA: Insufficient documentation

## 2014-11-03 DIAGNOSIS — E785 Hyperlipidemia, unspecified: Secondary | ICD-10-CM

## 2014-11-03 DIAGNOSIS — F411 Generalized anxiety disorder: Secondary | ICD-10-CM | POA: Diagnosis not present

## 2014-11-03 DIAGNOSIS — I10 Essential (primary) hypertension: Secondary | ICD-10-CM

## 2014-11-03 DIAGNOSIS — F17200 Nicotine dependence, unspecified, uncomplicated: Secondary | ICD-10-CM

## 2014-11-03 DIAGNOSIS — I2510 Atherosclerotic heart disease of native coronary artery without angina pectoris: Secondary | ICD-10-CM

## 2014-11-03 DIAGNOSIS — E039 Hypothyroidism, unspecified: Secondary | ICD-10-CM

## 2014-11-03 DIAGNOSIS — R0600 Dyspnea, unspecified: Secondary | ICD-10-CM

## 2014-11-03 DIAGNOSIS — Z720 Tobacco use: Secondary | ICD-10-CM | POA: Diagnosis not present

## 2014-11-03 DIAGNOSIS — E876 Hypokalemia: Secondary | ICD-10-CM

## 2014-11-03 LAB — CBC
HCT: 38.4 % (ref 36.0–46.0)
Hemoglobin: 13.1 g/dL (ref 12.0–15.0)
MCHC: 34 g/dL (ref 30.0–36.0)
MCV: 96.1 fl (ref 78.0–100.0)
Platelets: 210 10*3/uL (ref 150.0–400.0)
RBC: 3.99 Mil/uL (ref 3.87–5.11)
RDW: 14 % (ref 11.5–15.5)
WBC: 8.3 10*3/uL (ref 4.0–10.5)

## 2014-11-03 LAB — BASIC METABOLIC PANEL
BUN: 11 mg/dL (ref 6–23)
CO2: 26 mEq/L (ref 19–32)
Calcium: 9.2 mg/dL (ref 8.4–10.5)
Chloride: 106 mEq/L (ref 96–112)
Creatinine, Ser: 0.89 mg/dL (ref 0.40–1.20)
GFR: 68.75 mL/min (ref >60.00)
Glucose, Bld: 86 mg/dL (ref 70–99)
Potassium: 3.4 mEq/L — ABNORMAL LOW (ref 3.5–5.1)
Sodium: 140 mEq/L (ref 135–145)

## 2014-11-03 LAB — HEPATIC FUNCTION PANEL
ALT: 13 U/L (ref 0–35)
AST: 13 U/L (ref 0–37)
Albumin: 4.3 g/dL (ref 3.5–5.2)
Alkaline Phosphatase: 88 U/L (ref 39–117)
Bilirubin, Direct: 0.1 mg/dL (ref 0.0–0.3)
Total Bilirubin: 0.4 mg/dL (ref 0.2–1.2)
Total Protein: 6.9 g/dL (ref 6.0–8.3)

## 2014-11-03 LAB — LDL CHOLESTEROL, DIRECT: Direct LDL: 77 mg/dL

## 2014-11-03 LAB — BRAIN NATRIURETIC PEPTIDE: Pro B Natriuretic peptide (BNP): 29 pg/mL (ref 0.0–100.0)

## 2014-11-03 MED ORDER — NICOTINE 21 MG/24HR TD PT24: 21.0000 mg | MEDICATED_PATCH | Freq: Every day | TRANSDERMAL | Status: DC

## 2014-11-03 MED ORDER — NICOTINE 14 MG/24HR TD PT24: 14.0000 mg | MEDICATED_PATCH | Freq: Every day | TRANSDERMAL | Status: DC

## 2014-11-03 MED ORDER — ALPRAZOLAM 0.25 MG PO TABS: 0.2500 mg | ORAL_TABLET | Freq: Three times a day (TID) | ORAL | Status: DC | PRN

## 2014-11-03 MED ORDER — NICOTINE 7 MG/24HR TD PT24: 7.0000 mg | MEDICATED_PATCH | Freq: Every day | TRANSDERMAL | Status: DC

## 2014-11-03 NOTE — Progress Notes (Signed)
   Subjective:    Patient ID: Diana Erickson, female    DOB: 02/16/1956, 59 y.o.   MRN: [id number] CC: f/u hypothyroidism, HTN and depression  HPI 59 yo F with CAD, chronic anxiety and depression presents for f/u visit:  1. Anxiety and depression: declined with worsening symptoms over the past few weeks. Patient is stressed that her son and his family have lost their home. She feels hopeless. She is eating more than usual and making poor choices. Her sleep is poor. She would like an increase in her xanax dose. She is still compliant with lexapro and wellbutrin. She continues to smoke but would like to quit.   2. Hypothyroidism: gaining weight. No edema. Depressed mood. Taking synthroid every morning.  3. CAD: with CHF. Compliant with medication regimen. Has follow up with her cardiologist today. Still smoking but understands how important it is to quit. Has taking nitroglycerin since last OV for episode of chest pressure relieved with rest and nitroglycerin. Has SOB with moderate exertion but not at rest. Requesting renewal of handicap placard.   Soc Hx: current smoker 1/2 PPD  Review of Systems  Constitutional: Negative for fever and chills.  Respiratory: Positive for shortness of breath. Negative for cough.   Cardiovascular: Positive for chest pain. Negative for palpitations and leg swelling.  Gastrointestinal: Negative for abdominal pain and blood in stool.  Endocrine: Positive for polyphagia.  Skin: Negative for rash.  Psychiatric/Behavioral: Positive for sleep disturbance and dysphoric mood. Negative for suicidal ideas. The patient is nervous/anxious.      GAD-7: score of 12. 1-4,5,6. 2-2,3,7.  3-1.     Objective:   Physical Exam BP 101/66 mmHg  Pulse 79  Temp(Src) 99.1 F (37.3 C) (Oral)  Resp 16  Ht  (1.727 m)  Wt 197 lb (89.359 kg)  BMI 29.96 kg/m2  SpO2 98%  BP Readings from Last 3 Encounters:  11/03/14 101/66  07/27/14 124/64  07/10/14 128/77   Wt Readings  from Last 3 Encounters:  11/03/14 197 lb (89.359 kg)  06/19/14 183 lb (83.008 kg)  04/24/14 189 lb (85.73 kg)  General appearance: alert, cooperative, no distress and morbidly obese Neck: no adenopathy, supple, symmetrical, trachea midline and thyroid not enlarged, symmetric, no tenderness/mass/nodules Lungs: clear to auscultation bilaterally Heart: regular rate and rhythm, S1, S2 normal, no murmur, click, rub or gallop Extremities: extremities normal, atraumatic, no cyanosis or edema  Skin: < 1 cm circular skin colored papule on L knee   Lab Results  Component Value Date   TSH 0.910 06/19/2014   Depression screen [facility] 2/9 11/03/2014 06/19/2014 03/28/2014 03/01/2014 02/02/2014  Decreased Interest 3 0 3 0 0  Down, Depressed, Hopeless 3 0 3 0 0  PHQ - 2 Score 6 0 6 0 0  Altered sleeping 3 - 3 - -  Tired, decreased energy 3 - 3 - -  Change in appetite 3 - 3 - -  Feeling bad or failure about yourself  2 - 3 - -  Trouble concentrating 0 - 3 - -  Moving slowly or fidgety/restless 1 - 3 - -  Suicidal thoughts 1 - 0 - -  PHQ-9 Score 19 - 24 - -         Assessment & Plan:

## 2014-11-03 NOTE — Assessment & Plan Note (Signed)
1. Anxiety; Increase xanax to 0.5 mg before bed, continue 0.25 mg twice daily  For next month, then back to 0.25 mg three times a day  Continue Wellbutrin and lexapro I recommend counseling services, on site counseling available

## 2014-11-03 NOTE — Patient Instructions (Addendum)
We will be checking the following labs today -  HPF, CBC, BMET and BNP  Please go to [facility] to [facility] on the first floor for a chest Xray - you may walk in.    Medication Instructions:    Continue with your current medicines.     Testing/Procedures To Be Arranged:  Referral to pulmonary (anyone but Wert) for dyspnea  Follow-Up:   See Dr. Tenny Craw in 6 months    Other Special Instructions:   Work on your smoking.   Do not use the patches and smoke  Smoking Cessation Quitting smoking is important to your health and has many advantages. However, it is not always easy to quit since nicotine is a very addictive drug. Oftentimes, people try 3 times or more before being able to quit. This document explains the best ways for you to prepare to quit smoking. Quitting takes hard work and a lot of effort, but you can do it. ADVANTAGES OF QUITTING SMOKING You will live longer, feel better, and live better. Your body will feel the impact of quitting smoking almost immediately. Within 20 minutes, blood pressure decreases. Your pulse returns to its normal level. After 8 hours, carbon monoxide levels in the blood return to normal. Your oxygen level increases. After 24 hours, the chance of having a heart attack starts to decrease. Your breath, hair, and body stop smelling like smoke. After 48 hours, damaged nerve endings begin to recover. Your sense of taste and smell improve. After 72 hours, the body is virtually free of nicotine. Your bronchial tubes relax and breathing becomes easier. After 2 to 12 weeks, lungs can hold more air. Exercise becomes easier and circulation improves. The risk of having a heart attack, stroke, cancer, or lung disease is greatly reduced. After 1 year, the risk of coronary heart disease is cut in half. After 5 years, the risk of stroke falls to the same as a nonsmoker. After 10 years, the risk of lung cancer is cut in half and the risk of other  cancers decreases significantly. After 15 years, the risk of coronary heart disease drops, usually to the level of a nonsmoker. If you are pregnant, quitting smoking will improve your chances of having a healthy baby. The people you live with, especially any children, will be healthier. You will have extra money to spend on things other than cigarettes. QUESTIONS TO THINK ABOUT BEFORE ATTEMPTING TO QUIT You may want to talk about your answers with your health care provider. Why do you want to quit? If you tried to quit in the past, what helped and what did not? What will be the most difficult situations for you after you quit? How will you plan to handle them? Who can help you through the tough times? Your family? Friends? A health care provider? What pleasures do you get from smoking? What ways can you still get pleasure if you quit? Here are some questions to ask your health care provider: How can you help me to be successful at quitting? What medicine do you think would be best for me and how should I take it? What should I do if I need more help? What is smoking withdrawal like? How can I get information on withdrawal? GET READY Set a quit date. Change your environment by getting rid of all cigarettes, ashtrays, matches, and lighters in your home, car, or work. Do not let people smoke in your home. Review your past attempts to quit. Think about what  worked and what did not. GET SUPPORT AND ENCOURAGEMENT You have a better chance of being successful if you have help. You can get support in many ways. Tell your family, friends, and coworkers that you are going to quit and need their support. Ask them not to smoke around you. Get individual, group, or telephone counseling and support. Programs are available at [employer] and health centers. Call your local health department for information about programs in your area. Spiritual beliefs and practices may help some smokers quit. Download  a "quit meter" on your computer to keep track of quit statistics, such as how long you have gone without smoking, cigarettes not smoked, and money saved. Get a self-help book about quitting smoking and staying off tobacco. LEARN NEW SKILLS AND BEHAVIORS Distract yourself from urges to smoke. Talk to someone, go for a walk, or occupy your time with a task. Change your normal routine. Take a different route to work. Drink tea instead of coffee. Eat breakfast in a different place. Reduce your stress. Take a hot bath, exercise, or read a book. Plan something enjoyable to do every day. Reward yourself for not smoking. Explore interactive web-based programs that specialize in helping you quit. GET MEDICINE AND USE IT CORRECTLY Medicines can help you stop smoking and decrease the urge to smoke. Combining medicine with the above behavioral methods and support can greatly increase your chances of successfully quitting smoking. Nicotine replacement therapy helps deliver nicotine to your body without the negative effects and risks of smoking. Nicotine replacement therapy includes nicotine gum, lozenges, inhalers, nasal sprays, and skin patches. Some may be available over-the-counter and others require a prescription. Antidepressant medicine helps people abstain from smoking, but how this works is unknown. This medicine is available by prescription. Nicotinic receptor partial agonist medicine simulates the effect of nicotine in your brain. This medicine is available by prescription. Ask your health care provider for advice about which medicines to use and how to use them based on your health history. Your health care provider will tell you what side effects to look out for if you choose to be on a medicine or therapy. Carefully read the information on the package. Do not use any other product containing nicotine while using a nicotine replacement product.  RELAPSE OR DIFFICULT SITUATIONS Most relapses occur  within the first 3 months after quitting. Do not be discouraged if you start smoking again. Remember, most people try several times before finally quitting. You may have symptoms of withdrawal because your body is used to nicotine. You may crave cigarettes, be irritable, feel very hungry, cough often, get headaches, or have difficulty concentrating. The withdrawal symptoms are only temporary. They are strongest when you first quit, but they will go away within 10-14 days. To reduce the chances of relapse, try to: Avoid drinking alcohol. Drinking lowers your chances of successfully quitting. Reduce the amount of caffeine you consume. Once you quit smoking, the amount of caffeine in your body increases and can give you symptoms, such as a rapid heartbeat, sweating, and anxiety. Avoid smokers because they can make you want to smoke. Do not let weight gain distract you. Many smokers will gain weight when they quit, usually less than 10 pounds. Eat a healthy diet and stay active. You can always lose the weight gained after you quit. Find ways to improve your mood other than smoking. FOR MORE INFORMATION  www.smokefree.gov      Call the [facility] office at [phone] if you have any questions, problems or concerns.

## 2014-11-03 NOTE — Progress Notes (Signed)
CARDIOLOGY OFFICE NOTE  Date:  11/03/2014    Diana Erickson Date of Birth: [birth year] Medical Record #[id number]  PCP:  Minerva Ends, MD  Cardiologist:  Harrington Challenger    Chief Complaint  Patient presents with  . Coronary Artery Disease    6 month check - seen for Dr. Harrington Challenger    History of Present Illness: Diana Erickson is a 59 y.o. female who presents today for a 6 month check. She has a hx of HTN, HL, and tobacco abuse. Admitted October of 2015 with chest pain c/w unstable angina. CEs remained negative. EF was 45-50% by echo. LHC demonstrated borderline lesion in OM1. This was significant by IVUS and patient underwent PCI with DES to OM1.   Last seen in January - felt to be doing well. Continued to smoke.  Comes back today. Here alone. Has seen her PCP earlier today with issues of anxiety and shortness of breath. Smoking cessation recommended. Going to attempt to stop by using patches. Instructed by PCP to use more Xanax over the course of the next month. Weight is going up. No exercise. Notes she had some chest pain right after her last visit here - nothing that bothered her and has not recurred.   Past Medical History  Diagnosis Date  . HLD (hyperlipidemia) Dx 2013  . Hypertension Dx 2007  . Thyroid disease 1990s  . Anxiety   . Depression   . CAD (coronary artery disease) October 2015    PCI to OM1  . Tobacco abuse     Past Surgical History  Procedure Laterality Date  . Appendectomy    . Tibia fracture surgery Right   . Cesarean section    . Left heart catheterization with coronary angiogram N/A 01/30/2014    Procedure: LEFT HEART CATHETERIZATION WITH CORONARY ANGIOGRAM;  Surgeon: Lorretta Harp, MD;  Location: [facility] CATH LAB;  Service: Cardiovascular;  Laterality: N/A;     Medications: Current Outpatient Prescriptions  Medication Sig Dispense Refill  . ALPRAZolam (XANAX) 0.25 MG tablet Take 1-2 tablets (0.25-0.5 mg total) by mouth 3 (three) times daily as  needed for anxiety. 0.25 mg during the day and 0.5 mg before bed 120 tablet 0  . aspirin 81 MG tablet Take 1 tablet (81 mg total) by mouth daily.    Marland Kitchen atorvastatin (LIPITOR) 40 MG tablet Take 1 tablet (40 mg total) by mouth daily. 90 tablet 3  . buPROPion (WELLBUTRIN SR) 150 MG 12 hr tablet Take 1 tablet (150 mg total) by mouth 2 (two) times daily. 60 tablet 1  . calcium citrate-vitamin D 500-400 MG-UNIT chewable tablet Chew 1 tablet by mouth 2 (two) times daily. 180 tablet 1  . escitalopram (LEXAPRO) 20 MG tablet Take 1 tablet (20 mg total) by mouth daily. 30 tablet 2  . furosemide (LASIX) 20 MG tablet TAKE 1 TABLET ONCE DAILY. 30 tablet 0  . levothyroxine (SYNTHROID, LEVOTHROID) 112 MCG tablet Take 1 tablet (112 mcg total) by mouth daily before breakfast. 90 tablet 3  . lisinopril (PRINIVIL,ZESTRIL) 10 MG tablet Take 1 tablet (10 mg total) by mouth daily. 30 tablet 5  . meloxicam (MOBIC) 15 MG tablet Take after completing prednisone kit-- 1 po daily with food. 30 tablet 2  . Misc. Devices (ADJUSTABLE ALUMINUM CANE 3/4") MISC 1 each by Does not apply route daily. ICD 10-M72.2 1 each 0  . nicotine (NICODERM CQ - DOSED IN MG/24 HOURS) 14 mg/24hr patch Place 1 patch (14 mg total) onto the  skin daily. 14 patch 0  . nicotine (NICODERM CQ - DOSED IN MG/24 HOURS) 21 mg/24hr patch Place 1 patch (21 mg total) onto the skin daily. 42 patch 0  . nicotine (NICODERM CQ - DOSED IN MG/24 HR) 7 mg/24hr patch Place 1 patch (7 mg total) onto the skin daily. 14 patch 0  . nitroGLYCERIN (NITROSTAT) 0.4 MG SL tablet Place 1 tablet (0.4 mg total) under the tongue every 5 (five) minutes as needed for chest pain. 25 tablet 3  . pantoprazole (PROTONIX) 40 MG tablet Take 1 tablet (40 mg total) by mouth daily. 90 tablet 3  . potassium chloride SA (K-DUR,KLOR-CON) 20 MEQ tablet Take 1 tablet (20 mEq total) by mouth daily. 30 tablet 5  . prasugrel (EFFIENT) 10 MG TABS tablet Take 1 tablet (10 mg total) by mouth daily. 90  tablet 3  . traZODone (DESYREL) 100 MG tablet Take 1 tablet (100 mg total) by mouth at bedtime as needed for sleep. 90 tablet 1   Current Facility-Administered Medications  Medication Dose Route Frequency Provider Last Rate Last Dose  . triamcinolone acetonide (KENALOG) 10 MG/ML injection 10 mg  10 mg Other Once Harriet Masson, DPM        Allergies: No Known Allergies  Social History: The patient  reports that she has been smoking.  She does not have any smokeless tobacco history on file. She reports that she drinks alcohol. She reports that she does not use illicit drugs.   Family History: The patient's family history includes CAD in her mother; Cancer - Lung in her father and sister; Diabetes type II in her mother and sister; Heart attack in her mother, sister, and another family member; Heart failure in her mother; Hypertension in her mother, sister, and son.   Review of Systems: Please see the history of present illness.   Otherwise, the review of systems is positive for weight gain, dyspnea, depression, fatigue, snoring, constipation, and anxiety.   All other systems are reviewed and negative.   Physical Exam: VS:  BP 140/90 mmHg  Pulse 82  Ht 5' 8" (1.727 m)  Wt 197 lb (89.359 kg)  BMI 29.96 kg/m2  SpO2 96% .  BMI Body mass index is 29.96 kg/(m^2).  Wt Readings from Last 3 Encounters:  11/03/14 197 lb (89.359 kg)  11/03/14 197 lb (89.359 kg)  06/19/14 183 lb (83.008 kg)    General: Pleasant. Well developed, well nourished and in no acute distress. She is obese. She has gained weight. Smells of tobacco.  HEENT: Normal. Neck: Supple, no JVD, carotid bruits, or masses noted.  Cardiac: Regular rate and rhythm. No murmurs, rubs, or gallops. No edema.  Respiratory:  Lungs are clear to auscultation bilaterally with normal work of breathing.  GI: Soft and nontender.  MS: No deformity or atrophy. Gait and ROM intact. Skin: Warm and dry. Color is normal.  Neuro:  Strength and  sensation are intact and no gross focal deficits noted.  Psych: Alert, appropriate and with normal affect.   LABORATORY DATA:  EKG:  EKG is not ordered today.   Lab Results  Component Value Date   WBC 6.9 01/31/2014   HGB 12.9 01/31/2014   HCT 38.1 01/31/2014   PLT 171 01/31/2014   GLUCOSE 89 04/24/2014   CHOL 178 04/24/2014   TRIG 181* 04/24/2014   HDL 39* 04/24/2014   LDLCALC 103* 04/24/2014   ALT 11 04/24/2014   AST 13 04/24/2014   NA 138 04/24/2014   K  4.3 04/24/2014   CL 104 04/24/2014   CREATININE 0.82 04/24/2014   BUN 16 04/24/2014   CO2 26 04/24/2014   TSH 0.910 06/19/2014   INR 1.00 01/30/2014   HGBA1C 5.7* 01/30/2014    BNP (last 3 results) No results for input(s): BNP in the last 8760 hours.  ProBNP (last 3 results)  Recent Labs  01/28/14 2106  PROBNP 42.1     Other Studies Reviewed Today:  Studies: - LHC (10/15): Ostial LM 20-30%, prox OM1 60%, EF 60% >> IVUS guided PCI: 2.5 x 18 mm Xience Alpine DEX to OM1  - Echo (10/15): Mild LVH, EF 45-50%, no RWMA, trivial eff  Assessment/Plan: 1. Coronary artery disease:    - Continue ASA, Effient. OK to switch to plavix if needed   - ASA  81 mg QD.  - Continue statin.   - Beta blocker was not used due to bradycardia.   2. Cardiomyopathy: EF was normal by cardiac cath. No beta blocker was used due to bradycardia.   - Continue ACEI.  3. Essential hypertension: BP fair.   4. HLD (hyperlipidemia): Continue Simvastatin. Had her lipids checked by PCP but no LFTs.  5. Tobacco abuse -  Counselled on importance of cessation. Willing to try the patches.   6. Dyspnea - most likely from smoking. She has had COPD noted on prior CXR. Recheck today. Check labs today to include BNP and CBC also. Will refer to pulmonary but I have explained to her that their first recommendation will be to stop smoking.   Current medicines are reviewed with the patient today.  The  patient does not have concerns regarding medicines other than what has been noted above.  The following changes have been made:  See above.  Labs/ tests ordered today include:    Orders Placed This Encounter  Procedures  . DG Chest 2 View  . Basic metabolic panel  . Brain natriuretic peptide  . Hepatic function panel  . CBC  . Ambulatory referral to Pulmonology     Disposition:   FU with Dr. Harrington Challenger in 6 months.   Patient is agreeable to this plan and will call if any problems develop in the interim.   Signed: Burtis Junes, RN, ANP-C 11/03/2014 1:54 PM  [facility] [address] [place], [place]  [address] Phone: [phone] Fax: [phone]

## 2014-11-03 NOTE — Assessment & Plan Note (Signed)
Smoking cessation support: smoking cessation hotline: 1-800-QUIT-NOW.  Smoking cessation classes are available through [facility]. Call [phone] or visit our website at [email or web address].   

## 2014-11-03 NOTE — Patient Instructions (Signed)
Ms. Nouri,  Thank you for coming in today  1. Anxiety; Increase xanax to 0.5 mg before bed, continue 0.25 mg twice daily  For next month, then back to 0.25 mg three times a day  Continue Wellbutrin and lexapro I recommend counseling services, on site counseling available  2. Hypothyroidism: checking TSH  3. Heart disease: Form for permanent handicap placcard provided  Checking LDL You must quit smoking Patches ordered Smoking cessation support: smoking cessation hotline: 1-800-QUIT-NOW.  Smoking cessation classes are available through [facility]. Call [phone] or visit our website at [email or web address].  4. Knee wart: Try compound W  F/u in 3 months   Dr. Armen Pickup

## 2014-11-03 NOTE — Assessment & Plan Note (Signed)
Hypothyroidism: Compliant with synthroid checking TSH today and will adjust synthroid if needed

## 2014-11-03 NOTE — Assessment & Plan Note (Addendum)
A:Heart disease with CHF and ongoing smoking, patient experiencing angina that sound stable  P: Counseled to quit smoking, patches ordered Form for permanent handicap placcard provided  Checking LDL

## 2014-11-03 NOTE — Progress Notes (Signed)
F/U HTN, Depression

## 2014-11-04 LAB — TSH: TSH: 2.359 u[IU]/mL (ref 0.350–4.500)

## 2014-11-06 ENCOUNTER — Telehealth: Payer: Self-pay | Admitting: *Deleted

## 2014-11-06 DIAGNOSIS — J449 Chronic obstructive pulmonary disease, unspecified: Secondary | ICD-10-CM | POA: Insufficient documentation

## 2014-11-06 MED ORDER — ALBUTEROL SULFATE HFA 108 (90 BASE) MCG/ACT IN AERS: 2.0000 | INHALATION_SPRAY | Freq: Four times a day (QID) | RESPIRATORY_TRACT | Status: DC | PRN

## 2014-11-06 NOTE — Telephone Encounter (Signed)
Pt aware of results Notified Rx at pharmacy  Advised to quit smoking

## 2014-11-06 NOTE — Telephone Encounter (Signed)
-----   Message from Dessa Phi, MD sent at 11/06/2014 10:31 AM EDT ----- TSH and LDL normal Continue current treatment

## 2014-11-06 NOTE — Telephone Encounter (Signed)
Patient advised to quit smoking Albuterol inhaler for as needed use ordered

## 2014-11-06 NOTE — Telephone Encounter (Signed)
Pt requesting Rx for COPD

## 2014-11-06 NOTE — Telephone Encounter (Signed)
Pt is calling to obtain lab results and ask about Rx for COPD. Please follow up with pt.

## 2014-11-15 ENCOUNTER — Telehealth: Payer: Self-pay | Admitting: Clinical

## 2014-11-15 NOTE — Telephone Encounter (Signed)
F/u w pt; Introduction to [facility] services.  

## 2014-11-20 ENCOUNTER — Telehealth: Payer: Self-pay | Admitting: Internal Medicine

## 2014-11-20 ENCOUNTER — Other Ambulatory Visit: Payer: Medicaid Other

## 2014-11-20 NOTE — Telephone Encounter (Signed)
Can we make sure we get this?

## 2014-11-20 NOTE — Telephone Encounter (Signed)
F/U        Pt calling wanting to make sure we got lab work. Please call back and advise.

## 2014-11-20 NOTE — Telephone Encounter (Signed)
Pt calling to ask Norma Fredrickson NP if she could have her lab (bmet) drawn at [facility] today, for this is a shorter commute for her.  Pt states she was supposed to come into our office today to have a repeat bmet done, based on her lab results from 3 weeks ago showing hypokalemia at 3.4.  Pt cancelled her lab appt here at our office, and request that the bmet lab order be sent to [facility], for they have a walk-in lab there.  Called [facility] and spoke with RN  Toniann Fail to give a verbal order to check a bmet on the pt today. Their contact information is T - [phone], and F-  [phone]. Provided our contact information and ordering provider with NPI #.  Informed Toniann Fail that once the pts results come back in, she should fax this back to our office at [phone] Attention Norma Fredrickson NP, and Duwayne Heck CMA.  Informed Toniann Fail of our contact # as well.  ICD 10 provided for this lab (hypokalemia E87.6).   Toniann Fail verbalized understanding of telephone order given, with verbal readback.  Contacted the pt back to inform her of their lab hours today. Pt verbalized understanding and gracious for all the assistance provided.  Will send this message to Norma Fredrickson NP and covering CMA as an FYI.

## 2014-11-20 NOTE — Telephone Encounter (Signed)
Spoke with pt and informed her that I did not see where her labs have been received as of yet. Pt states that she went and had them drawn at noon and they said it would be about 30 mins to get results and then they would fax them to our office. Will route to Brien Mates, CMA in the event that labs were taken directly to pod.

## 2014-11-20 NOTE — Telephone Encounter (Signed)
New Message       Pt calling wanting to get her lab work done at another facility. Pt doesn't know the name, phone number, or fax number to the facility. She only knows where it is. Please call back and advise.

## 2014-11-21 ENCOUNTER — Encounter: Payer: Self-pay | Admitting: Nurse Practitioner

## 2014-11-21 NOTE — Telephone Encounter (Signed)
F/u      Pt calling back f/u on labs to see if we received them. Please call back and advise.

## 2014-11-21 NOTE — Telephone Encounter (Signed)
Labs received. Shanda Bumps in medical records to scan and send to Norma Fredrickson, NP.

## 2014-12-01 ENCOUNTER — Institutional Professional Consult (permissible substitution): Payer: Medicaid Other | Admitting: Pulmonary Disease

## 2014-12-12 ENCOUNTER — Institutional Professional Consult (permissible substitution): Payer: Medicaid Other | Admitting: Pulmonary Disease

## 2014-12-14 ENCOUNTER — Institutional Professional Consult (permissible substitution): Payer: Medicaid Other | Admitting: Pulmonary Disease

## 2014-12-15 ENCOUNTER — Encounter: Payer: Self-pay | Admitting: Pulmonary Disease

## 2014-12-15 ENCOUNTER — Ambulatory Visit (INDEPENDENT_AMBULATORY_CARE_PROVIDER_SITE_OTHER): Payer: Medicaid Other | Admitting: Pulmonary Disease

## 2014-12-15 VITALS — BP 112/76 | HR 78 | Temp 98.1°F | Ht 69.0 in | Wt 200.6 lb

## 2014-12-15 DIAGNOSIS — F411 Generalized anxiety disorder: Secondary | ICD-10-CM

## 2014-12-15 DIAGNOSIS — I25119 Atherosclerotic heart disease of native coronary artery with unspecified angina pectoris: Secondary | ICD-10-CM | POA: Diagnosis not present

## 2014-12-15 DIAGNOSIS — Z23 Encounter for immunization: Secondary | ICD-10-CM | POA: Diagnosis not present

## 2014-12-15 DIAGNOSIS — Z720 Tobacco use: Secondary | ICD-10-CM | POA: Diagnosis not present

## 2014-12-15 DIAGNOSIS — E039 Hypothyroidism, unspecified: Secondary | ICD-10-CM

## 2014-12-15 DIAGNOSIS — R0600 Dyspnea, unspecified: Secondary | ICD-10-CM | POA: Diagnosis not present

## 2014-12-15 DIAGNOSIS — K2100 Gastro-esophageal reflux disease with esophagitis, without bleeding: Secondary | ICD-10-CM

## 2014-12-15 DIAGNOSIS — E663 Overweight: Secondary | ICD-10-CM

## 2014-12-15 DIAGNOSIS — I10 Essential (primary) hypertension: Secondary | ICD-10-CM

## 2014-12-15 DIAGNOSIS — I5022 Chronic systolic (congestive) heart failure: Secondary | ICD-10-CM

## 2014-12-15 MED ORDER — CLONAZEPAM 1 MG PO TABS: 1.0000 mg | ORAL_TABLET | Freq: Two times a day (BID) | ORAL | Status: DC

## 2014-12-15 NOTE — Patient Instructions (Signed)
Diana Erickson-- it was nice meeting you today...  I think we can help you w/ your breathing but our success ABSOLUTELY depends on whether you are able to quit smoking!!!  Today we did a pulmonary function test, & an ambulatory oxygen saturation test... We will arrange for an outpt CT scan of your lungs to be sure there are no hidden tumors etc...    We will contact you w/ the results when available...   To help your breathing and allow for a deeper breath (able to get the air "IN" better)  we decided to place you on a combination relaxer=> KLONOPIN 1gm tabs take one tab twice daily on a regular schedule...  The majority of your improvement in breathing is going to come from your quitting the smoking  AND increasing your exercise program & getting your weight down!  We also gave you the 2016 Flu vaccine today...  Let's plan a follow up visit in 4-6wks to check your progress, sooner if needed for breathing problems.Marland KitchenMarland Kitchen

## 2014-12-15 NOTE — Progress Notes (Signed)
Subjective:     Patient ID: Diana Erickson, female   DOB: 01-07-1956, 59 y.o.   MRN: [id number]  HPI 59 y/o WF referred by Truitt Merle, NP in Cardiology for a pulmonary evaluation> her PCP is Dr. Boykin Nearing at [facility]...      Yomaira has a hx of CAD- s/p PCI, EF=45-50% by Echo but 60% by Cath, HBP, and HL on ASA81, Effient10, Lisinopril10, Lasix20, and K20;  She recently had a Cards f/u visit- c/o SOB & on going smoking=> referred to [facility] for eval;  She notes SOB all the time, comes and goes, worse in the heat, she does ok w/ ADLs but increase dyspnea w/ walking & activities eg- walking into office from parking lot but it's been the same for ~37yrshe says;  She denies much cough, min clear sput, no discoloration or blood;  She notes CP which she describes as a pressure & calls it angina;  She denies swelling on the Lasix & low sodium diet;  When pressed for a better decription of her SOB she admits it's a problem getting the air "IN" and "sometimes when I breath deep the air doesn't help me"      She is a smoker> having started in her teens and smoking 1/2ppd currently; her max was 1.5ppd for several yrs & ave was 1ppd for much of her 40+ smoking hx=> est 50+ pack yrs...      She admits to occas bouts of bronchitis (usually treated w/ antibiotics and resolves);  Denies prev hx pneumonia, Tb or exposure, and no hospitalizations for respiratory issues...      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  EXAM showed Afeb, VSS, O2sat=100% on RA, wt=201# 5'9"Tall BMI=30;  HEENT- neg,  mallampati1;  Chest- clear w/o w/r/r;  Heart- RR w/o m/r/r;  Abd- soft, neg;  Ext- w/o c/c/e;  Neuro- intact...  CXR 11/03/14 showed norm heart size, mild hyperinflation, coarse interstitial markings bilat, scarring right angle, NAD...   Spirometry 12/15/14 showed FVC=3.1 (79%), FEV1=2.3 (77%), %1sec=75 and mid-flows were sl reduced at 69% predicted; this is c/w very mild obstructive dis (small airways dis) & can't r/o superimposed restriction w/o lung vol measure.  Ambulatory oxygen saturation test 12/15/14> O2sat=99% on RA at rest w/ pulse=72/min;  She ambulated 3 laps in the office w/ lowest O2sat=91% w/ pulse=110/min...   LABS 7/16 showed Chems- wnl x K=3.4, BNP=29;  CBC- wnl;  TSH= 2.36...  CT Chest w/o contrast> pending  IMP/PLAN>>  PKathihas a signif smoking history but actually has little to show for it in the form of airflow obstruction on her spirometry;  In other words she is quite fortunate that she does not have worse obstructive dis at this point & it is imperative that she preserve her lung function by stopping smoking now- we had a long talk about this today, she is already on Wellbutrin & will add-in Nicotine replacemnent options (patches, gum, lozenges);  He dyspnea is  more related to anxiety and 'chest wall factors' w/ 'spasm in the chest wall muscles' preventing her from getting a good deep breath- in this regard I would prefer her to be on a regular dose of KLONOPIN 48mBid and she can still use her Alprazolam 0.289mprn for anxiety (NOTE- [place] She will follow up w/ me in 4-6wks, we gave her the 2016 Flu vaccine today.    Past Medical History  Diagnosis Date  . HLD (hyperlipidemia) >> on Lipitor40 Dx 2013  . Hypertension >> on Lisinopril10, Lasix20, K20 Dx 2007  . Thyroid disease >.on Synthroid112 1990s  . Anxiety >> on Alprazolam 0.2535mid prn & WellbutrinSR 150Bid   . Depression >> on Lexapro20    GERD  >> on Protonix40   . CAD (coronary artery disease) >> on ASA81, Effient10 October 2015    PCI to OM1  . Tobacco abuse => she is trying the nicotine replacement options     Past Surgical History  Procedure Laterality Date  . Appendectomy    . Tibia fracture surgery Right   . Cesarean section    . Left heart catheterization with coronary angiogram N/A 01/30/2014    Procedure: LEFT HEART CATHETERIZATION WITH CORONARY ANGIOGRAM;  Surgeon: JonLorretta HarpD;  [facility];  Service: Cardiovascular;  Laterality: N/A;    Outpatient Encounter Prescriptions as of 12/15/2014  Medication Sig  . albuterol (PROVENTIL HFA;VENTOLIN HFA) 108 (90 BASE) MCG/ACT inhaler Inhale 2 puffs into the lungs every 6 (six) hours as needed for wheezing or shortness of breath.  . ALPRAZolam (XANAX) 0.25 MG tablet Take 1-2 tablets (0.25-0.5 mg total) by mouth 3 (three) times daily as needed for anxiety. 0.25 mg during the day and 0.5 mg before bed  . aspirin 81 MG tablet Take 1 tablet (81 mg total) by mouth daily.  . aMarland Kitchenorvastatin (LIPITOR) 40 MG tablet Take 1 tablet (40 mg total) by mouth daily.  . bMarland KitchenPROPion (WELLBUTRIN SR) 150 MG 12 hr tablet Take 1 tablet (150 mg total) by mouth 2 (two) times daily.  . eMarland Kitchencitalopram (LEXAPRO) 20 MG tablet Take 1 tablet (20 mg total) by mouth daily.  . furosemide (LASIX) 20 MG tablet TAKE 1 TABLET ONCE DAILY.  . lMarland Kitchenvothyroxine (SYNTHROID, LEVOTHROID) 112 MCG tablet Take 1 tablet (112 mcg total) by mouth daily before breakfast.  . lisinopril (PRINIVIL,ZESTRIL) 10 MG tablet Take 1 tablet (10 mg total) by mouth daily.  . nitroGLYCERIN (NITROSTAT) 0.4 MG SL tablet Place 1 tablet (0.4 mg total) under the tongue every 5 (five) minutes as needed for chest pain.  . pantoprazole (PROTONIX) 40 MG tablet Take 1 tablet (40 mg total) by mouth daily.  . potassium chloride SA (K-DUR,KLOR-CON) 20 MEQ tablet Take 1 tablet (20 mEq total) by mouth daily.  . prasugrel (EFFIENT) 10 MG TABS tablet  Take 1 tablet (10 mg total) by mouth daily.  . calcium citrate-vitamin D 500-400 MG-UNIT chewable tablet Chew 1 tablet by mouth 2 (two) times daily. (Patient not taking: Reported on 12/15/2014)  . nicotine (NICODERM CQ - DOSED IN MG/24 HOURS) 14 mg/24hr patch Place 1 patch (14 mg total) onto the skin daily. (Patient not taking: Reported on 12/15/2014)  . nicotine (NICODERM CQ - DOSED IN MG/24 HOURS) 21 mg/24hr patch Place 1 patch (21 mg total) onto the skin daily. (Patient not taking: Reported on 12/15/2014)  . nicotine (NICODERM CQ - DOSED IN MG/24 HR) 7  mg/24hr patch Place 1 patch (7 mg total) onto the skin daily. (Patient not taking: Reported on 12/15/2014)  . traZODone (DESYREL) 100 MG tablet Take 1 tablet (100 mg total) by mouth at bedtime as needed for sleep. (Patient not taking: Reported on 12/15/2014)  . [DISCONTINUED] meloxicam (MOBIC) 15 MG tablet Take after completing prednisone kit-- 1 po daily with food. (Patient not taking: Reported on 12/15/2014)  . [DISCONTINUED] Misc. Devices (ADJUSTABLE ALUMINUM CANE 3/4") MISC 1 each by Does not apply route daily. ICD 10-M72.2   Facility-Administered Encounter Medications as of 12/15/2014  Medication  . triamcinolone acetonide (KENALOG) 10 MG/ML injection 10 mg    No Known Allergies   Immunization History  Administered Date(s) Administered  . Influenza,inj,Quad PF,36+ Mos 01/30/2014  . Pneumococcal Polysaccharide-23 01/30/2014    Family History  Problem Relation Age of Onset  . CAD Mother   . Diabetes type II Mother   . Cancer - Lung Father   . Diabetes type II Sister   . Cancer - Lung Sister   . Heart attack Sister   . Heart attack    . Heart failure Mother   . Hypertension Mother   . Hypertension Sister   . Hypertension Son   . Heart attack Mother     Social History   Social History  . Marital Status: Widowed    Spouse Name: N/A  . Number of Children: N/A  . Years of Education: N/A   Occupational History  . Not on file.    Social History Main Topics  . Smoking status: Current Every Day Smoker -- 1.00 packs/day for 25 years    Types: Cigarettes  . Smokeless tobacco: Not on file  . Alcohol Use: 0.0 oz/week    0 Standard drinks or equivalent per week     Comment: occasional  . Drug Use: No  . Sexual Activity: Not on file   Other Topics Concern  . Not on file   Social History Narrative    Current Medications, Allergies, Past Medical History, Past Surgical History, Family History, and Social History were reviewed in [employer] record.   Review of Systems            All symptoms NEG except where BOLDED >>  Constitutional:  F/C/S, fatigue, anorexia, unexpected weight change. HEENT:  HA, visual changes, hearing loss, earache, nasal symptoms, sore throat, mouth sores, hoarseness. Resp:  cough, sputum, hemoptysis; SOB, tightness, wheezing. Cardio:  CP, palpit, DOE, orthopnea, edema. GI:  N/V/D/C, blood in stool; reflux, abd pain, distention, gas. GU:  dysuria, freq, urgency, hematuria, flank pain, voiding difficulty. MS:  joint pain, swelling, tenderness, decr ROM; neck pain, back pain, etc. Neuro:  HA, tremors, seizures, dizziness, syncope, weakness, numbness, gait abn. Skin:  suspicious lesions or skin rash. Heme:  adenopathy, bruising, bleeding. Psyche:  confusion, agitation, sleep disturbance, hallucinations, anxiety, depression suicidal.   Objective:   Physical Exam      Vital Signs:  Reviewed...  General:  WD, overweight, 59 y/o WF in NAD; alert & oriented; pleasant & cooperative... HEENT:  [place]/AT; Conjunctiva- pink, Sclera- nonicteric, EOM-wnl, PERRLA, EACs-clear, TMs-wnl; NOSE-clear; THROAT-clear & wnl. Neck:  Supple w/ fair ROM; no JVD; normal carotid impulses w/o bruits; no thyromegaly or nodules palpated; no lymphadenopathy. Chest:  Clear to P & A; without wheezes, rales, or rhonchi heard. Heart:  Regular Rhythm; norm S1 & S2 without murmurs, rubs, or gallops  detected. Abdomen:  Soft & nontender- no guarding or rebound; normal bowel  sounds; no organomegaly or masses palpated. Ext:  Normal ROM; without deformities or arthritic changes; no varicose veins, venous insuffic, or edema;  Pulses intact w/o bruits. Neuro:  No focal neuro deficits; sensory testing normal; gait normal & balance OK. Derm:  No lesions noted; no rash etc. Lymph:  No cervical, supraclavicular, axillary, or inguinal adenopathy palpated.   Assessment:     IMP >>     Dyspnea is likely multifactorial w/ anxiety, chest wall factors, lack of exercise & deconditioning playing a roll...    Cigarette smoker> we reviewed smoking cessation options    Min obstructive lung dis from smoking> she has AlbutHFA to use prn    HBP> controlled on Lisin10 & Lasix20    CAD- s/p PCI to OM1> on ASA81 & Effient10    HL> on Lip40    Anxiety & Depression> on Alpraz0.25, Wellbutrin150Bid, & Lexapro20    Mult medical problems including above + Hypothy (on Synthroid112), and GERD (on Protonix40).   PLAN >>  Vanesa has a signif smoking history but actually has little to show for it in the form of airflow obstruction on her spirometry;  In other words she is quite fortunate that she does not have worse obstructive dis at this point & it is imperative that she preserve her lung function by stopping smoking now- we had a long talk about this today, she is already on Wellbutrin & will add-in Nicotine replacemnent options (patches, gum, lozenges);  He dyspnea is more related to anxiety and 'chest wall factors' w/ 'spasm in the chest wall muscles' preventing her from getting a good deep breath- in this regard I would prefer her to be on a regular dose of KLONOPIN 57mBid and she can still use her Alprazolam 0.267mprn for anxiety (NOTE- [place] She will follow up w/ me in 4-6wks, we gave her the 2016 Flu vaccine today.     Plan:     Patient's  Medications  New Prescriptions   CLONAZEPAM (KLONOPIN) 1 MG TABLET    Take 1 tablet (1 mg total) by mouth 2 (two) times daily.  Previous Medications   ALBUTEROL (PROVENTIL HFA;VENTOLIN HFA) 108 (90 BASE) MCG/ACT INHALER    Inhale 2 puffs into the lungs every 6 (six) hours as needed for wheezing or shortness of breath.   ALPRAZOLAM (XANAX) 0.25 MG TABLET    Take 1-2 tablets (0.25-0.5 mg total) by mouth 3 (three) times daily as needed for anxiety. 0.25 mg during the day and 0.5 mg before bed   ASPIRIN 81 MG TABLET    Take 1 tablet (81 mg total) by mouth daily.   ATORVASTATIN (LIPITOR) 40 MG TABLET    Take 1 tablet (40 mg total) by mouth daily.   BUPROPION (WELLBUTRIN SR) 150 MG 12 HR TABLET    Take 1 tablet (150 mg total) by mouth 2 (two) times daily.   CALCIUM CITRATE-VITAMIN D 500-400 MG-UNIT CHEWABLE TABLET    Chew 1 tablet by mouth 2 (two) times daily.   ESCITALOPRAM (LEXAPRO) 20 MG TABLET    Take 1 tablet (20 mg total) by mouth daily.   FUROSEMIDE (LASIX) 20 MG TABLET    TAKE 1 TABLET ONCE DAILY.   LEVOTHYROXINE (SYNTHROID, LEVOTHROID) 112 MCG TABLET    Take 1 tablet (112 mcg total) by mouth daily before breakfast.   LISINOPRIL (PRINIVIL,ZESTRIL) 10 MG TABLET    Take 1 tablet (10 mg total) by mouth  daily.   NICOTINE (NICODERM CQ - DOSED IN MG/24 HOURS) 14 MG/24HR PATCH    Place 1 patch (14 mg total) onto the skin daily.   NICOTINE (NICODERM CQ - DOSED IN MG/24 HOURS) 21 MG/24HR PATCH    Place 1 patch (21 mg total) onto the skin daily.   NICOTINE (NICODERM CQ - DOSED IN MG/24 HR) 7 MG/24HR PATCH    Place 1 patch (7 mg total) onto the skin daily.   NITROGLYCERIN (NITROSTAT) 0.4 MG SL TABLET    Place 1 tablet (0.4 mg total) under the tongue every 5 (five) minutes as needed for chest pain.   PANTOPRAZOLE (PROTONIX) 40 MG TABLET    Take 1 tablet (40 mg total) by mouth daily.   POTASSIUM CHLORIDE SA (K-DUR,KLOR-CON) 20 MEQ TABLET    Take 1 tablet (20 mEq total) by mouth daily.   PRASUGREL (EFFIENT)  10 MG TABS TABLET    Take 1 tablet (10 mg total) by mouth daily.   TRAZODONE (DESYREL) 100 MG TABLET    Take 1 tablet (100 mg total) by mouth at bedtime as needed for sleep.  Modified Medications   No medications on file  Discontinued Medications   MELOXICAM (MOBIC) 15 MG TABLET    Take after completing prednisone kit-- 1 po daily with food.   MISC. DEVICES (ADJUSTABLE ALUMINUM CANE 3/4") MISC    1 each by Does not apply route daily. ICD 10-M72.2

## 2014-12-27 ENCOUNTER — Inpatient Hospital Stay: Admission: RE | Admit: 2014-12-27 | Payer: Medicaid Other | Source: Ambulatory Visit

## 2014-12-28 ENCOUNTER — Emergency Department (HOSPITAL_COMMUNITY): Payer: Medicaid Other

## 2014-12-28 ENCOUNTER — Encounter (HOSPITAL_COMMUNITY): Payer: Self-pay | Admitting: Emergency Medicine

## 2014-12-28 ENCOUNTER — Other Ambulatory Visit: Payer: Self-pay

## 2014-12-28 ENCOUNTER — Inpatient Hospital Stay (HOSPITAL_COMMUNITY)
Admission: EM | Admit: 2014-12-28 | Discharge: 2014-12-30 | DRG: 287 | Disposition: A | Discharge status: Home / Self Care | Payer: Medicaid Other | Attending: Internal Medicine | Admitting: Internal Medicine

## 2014-12-28 DIAGNOSIS — Y831 Surgical operation with implant of artificial internal device as the cause of abnormal reaction of the patient, or of later complication, without mention of misadventure at the time of the procedure: Secondary | ICD-10-CM | POA: Diagnosis present

## 2014-12-28 DIAGNOSIS — F411 Generalized anxiety disorder: Secondary | ICD-10-CM | POA: Diagnosis present

## 2014-12-28 DIAGNOSIS — I5022 Chronic systolic (congestive) heart failure: Secondary | ICD-10-CM | POA: Diagnosis present

## 2014-12-28 DIAGNOSIS — Z8249 Family history of ischemic heart disease and other diseases of the circulatory system: Secondary | ICD-10-CM

## 2014-12-28 DIAGNOSIS — Z6828 Body mass index (BMI) 28.0-28.9, adult: Secondary | ICD-10-CM

## 2014-12-28 DIAGNOSIS — Z720 Tobacco use: Secondary | ICD-10-CM | POA: Diagnosis present

## 2014-12-28 DIAGNOSIS — G894 Chronic pain syndrome: Secondary | ICD-10-CM | POA: Diagnosis present

## 2014-12-28 DIAGNOSIS — K219 Gastro-esophageal reflux disease without esophagitis: Secondary | ICD-10-CM | POA: Diagnosis present

## 2014-12-28 DIAGNOSIS — I2510 Atherosclerotic heart disease of native coronary artery without angina pectoris: Secondary | ICD-10-CM | POA: Diagnosis present

## 2014-12-28 DIAGNOSIS — I10 Essential (primary) hypertension: Secondary | ICD-10-CM | POA: Diagnosis present

## 2014-12-28 DIAGNOSIS — F17210 Nicotine dependence, cigarettes, uncomplicated: Secondary | ICD-10-CM | POA: Diagnosis present

## 2014-12-28 DIAGNOSIS — T82858A Stenosis of vascular prosthetic devices, implants and grafts, initial encounter: Secondary | ICD-10-CM | POA: Diagnosis present

## 2014-12-28 DIAGNOSIS — Z955 Presence of coronary angioplasty implant and graft: Secondary | ICD-10-CM

## 2014-12-28 DIAGNOSIS — E785 Hyperlipidemia, unspecified: Secondary | ICD-10-CM | POA: Diagnosis present

## 2014-12-28 DIAGNOSIS — R079 Chest pain, unspecified: Secondary | ICD-10-CM | POA: Diagnosis present

## 2014-12-28 DIAGNOSIS — I25110 Atherosclerotic heart disease of native coronary artery with unstable angina pectoris: Principal | ICD-10-CM | POA: Diagnosis present

## 2014-12-28 DIAGNOSIS — J449 Chronic obstructive pulmonary disease, unspecified: Secondary | ICD-10-CM | POA: Diagnosis present

## 2014-12-28 DIAGNOSIS — I200 Unstable angina: Secondary | ICD-10-CM | POA: Diagnosis present

## 2014-12-28 DIAGNOSIS — E039 Hypothyroidism, unspecified: Secondary | ICD-10-CM | POA: Diagnosis present

## 2014-12-28 DIAGNOSIS — Z833 Family history of diabetes mellitus: Secondary | ICD-10-CM

## 2014-12-28 DIAGNOSIS — Z79899 Other long term (current) drug therapy: Secondary | ICD-10-CM

## 2014-12-28 DIAGNOSIS — E663 Overweight: Secondary | ICD-10-CM | POA: Diagnosis present

## 2014-12-28 DIAGNOSIS — Z7982 Long term (current) use of aspirin: Secondary | ICD-10-CM

## 2014-12-28 DIAGNOSIS — Z7902 Long term (current) use of antithrombotics/antiplatelets: Secondary | ICD-10-CM

## 2014-12-28 LAB — CBC WITH DIFFERENTIAL/PLATELET
BASOS PCT: 1 %
Basophils Absolute: 0 10*3/uL (ref 0.0–0.1)
EOS ABS: 0.1 10*3/uL (ref 0.0–0.7)
Eosinophils Relative: 1 %
HCT: 38.9 % (ref 36.0–46.0)
HEMOGLOBIN: 13.4 g/dL (ref 12.0–15.0)
LYMPHS ABS: 2.4 10*3/uL (ref 0.7–4.0)
Lymphocytes Relative: 27 %
MCH: 33.6 pg (ref 26.0–34.0)
MCHC: 34.4 g/dL (ref 30.0–36.0)
MCV: 97.5 fL (ref 78.0–100.0)
Monocytes Absolute: 0.5 10*3/uL (ref 0.1–1.0)
Monocytes Relative: 6 %
NEUTROS PCT: 65 %
Neutro Abs: 5.7 10*3/uL (ref 1.7–7.7)
Platelets: 218 10*3/uL (ref 150–400)
RBC: 3.99 MIL/uL (ref 3.87–5.11)
RDW: 13.4 % (ref 11.5–15.5)
WBC: 8.7 10*3/uL (ref 4.0–10.5)

## 2014-12-28 LAB — I-STAT CHEM 8, ED
BUN: 9 mg/dL (ref 6–20)
CREATININE: 0.9 mg/dL (ref 0.44–1.00)
Calcium, Ion: 1.25 mmol/L (ref 1.13–1.30)
Chloride: 105 mmol/L (ref 101–111)
Glucose, Bld: 104 mg/dL — ABNORMAL HIGH (ref 65–99)
HEMATOCRIT: 41 % (ref 36.0–46.0)
HEMOGLOBIN: 13.9 g/dL (ref 12.0–15.0)
POTASSIUM: 3.3 mmol/L — AB (ref 3.5–5.1)
Sodium: 142 mmol/L (ref 135–145)
TCO2: 24 mmol/L (ref 0–100)

## 2014-12-28 LAB — I-STAT TROPONIN, ED: Troponin i, poc: 0 ng/mL (ref 0.00–0.08)

## 2014-12-28 MED ORDER — NITROGLYCERIN IN D5W 200-5 MCG/ML-% IV SOLN
0.0000 ug/min | Freq: Once | INTRAVENOUS | Status: AC
  Administered 2014-12-28: 5 ug/min via INTRAVENOUS
  Filled 2014-12-28: qty 250

## 2014-12-28 NOTE — ED Notes (Signed)
Pt states that her chest pain started around 1200 today. Pt took her home nitro pill and pain was relieved. Pt had another bout of chest pain that started around 2000. Pt called 911 and was told to take 325 of ASA. Pt took one nitro at home prior to 911 arrival. Pt had no relief and was given two more nitro pills enroute to the hospital. Pt stated some relief upon arrival to the ER. Pt describes her pain as a pressure on the left side that radiates to left arm and jaw. Pt with cardiac history and COPD. EMS vitals up arrival to ER: B/P 12/84, HR 88, Resp 16, O2 sat of 98 %.

## 2014-12-28 NOTE — ED Provider Notes (Signed)
CSN: [id number]     Arrival date & time 12/28/14  2151 History   First MD Initiated Contact with Patient 12/28/14 2155     Chief Complaint  Patient presents with  . Chest Pain     (Consider location/radiation/quality/duration/timing/severity/associated sxs/prior Treatment) Patient is a 59 y.o. female presenting with chest pain. The history is provided by the patient.  Chest Pain Pain location:  L chest Associated symptoms: no abdominal pain, no back pain, no headache, no nausea, no numbness, no shortness of breath, not vomiting and no weakness    patient with left-sided chest pain. His pressure goes from her left chest for left arm. States it feels like when she had her stent previously. She had one episode around noon today that resolved after about 2 hours and she had another one was started at 8 PM tonight. She said that one for about 2 hours 2. Nitroglycerin has helped somewhat the pain but is still present. No nausea vomiting. No cough. States she has felt a little bad overall the last couple days. No fevers or chills. No swelling or legs. No headache. No confusion.  Past Medical History  Diagnosis Date  . HLD (hyperlipidemia) Dx 2013  . Hypertension Dx 2007  . Thyroid disease 1990s  . Anxiety   . Depression   . CAD (coronary artery disease) October 2015    PCI to OM1  . Tobacco abuse    Past Surgical History  Procedure Laterality Date  . Appendectomy    . Tibia fracture surgery Right   . Cesarean section    . Left heart catheterization with coronary angiogram N/A 01/30/2014    Procedure: LEFT HEART CATHETERIZATION WITH CORONARY ANGIOGRAM;  Surgeon: Runell Gess, MD;  [facility];  Service: Cardiovascular;  Laterality: N/A;   Family History  Problem Relation Age of Onset  . CAD Mother   . Diabetes type II Mother   . Cancer - Lung Father   . Diabetes type II Sister   . Cancer - Lung Sister   . Heart attack Sister   . Heart attack    . Heart failure Mother    . Hypertension Mother   . Hypertension Sister   . Hypertension Son   . Heart attack Mother    Social History  Substance Use Topics  . Smoking status: Current Every Day Smoker -- 1.00 packs/day for 25 years    Types: Cigarettes  . Smokeless tobacco: None  . Alcohol Use: 0.0 oz/week    0 Standard drinks or equivalent per week     Comment: occasional   OB History    No data available     Review of Systems  Constitutional: Negative for activity change and appetite change.  Eyes: Negative for pain.  Respiratory: Negative for chest tightness and shortness of breath.   Cardiovascular: Positive for chest pain. Negative for leg swelling.  Gastrointestinal: Negative for nausea, vomiting, abdominal pain and diarrhea.  Genitourinary: Negative for flank pain.  Musculoskeletal: Negative for back pain and neck stiffness.  Skin: Negative for rash.  Neurological: Negative for weakness, numbness and headaches.  Psychiatric/Behavioral: Negative for behavioral problems.      Allergies  Review of patient's allergies indicates no known allergies.  Home Medications   Prior to Admission medications   Medication Sig Start Date End Date Taking? Authorizing Provider  albuterol (PROVENTIL HFA;VENTOLIN HFA) 108 (90 BASE) MCG/ACT inhaler Inhale 2 puffs into the lungs every 6 (six) hours as needed for wheezing  or shortness of breath. 11/06/14  Yes Josalyn Funches, MD  ALPRAZolam (XANAX) 0.25 MG tablet Take 1-2 tablets (0.25-0.5 mg total) by mouth 3 (three) times daily as needed for anxiety. 0.25 mg during the day and 0.5 mg before bed 11/03/14  Yes Josalyn Funches, MD  aspirin 81 MG tablet Take 1 tablet (81 mg total) by mouth daily. 02/20/14  Yes Scott Moishe Spice, PA-C  atorvastatin (LIPITOR) 40 MG tablet Take 1 tablet (40 mg total) by mouth daily. 04/25/14  Yes Josalyn Funches, MD  buPROPion (WELLBUTRIN SR) 150 MG 12 hr tablet Take 1 tablet (150 mg total) by mouth 2 (two) times daily. 06/19/14  Yes Josalyn  Funches, MD  clonazePAM (KLONOPIN) 1 MG tablet Take 1 tablet (1 mg total) by mouth 2 (two) times daily. 12/15/14  Yes Michele Mcalpine, MD  escitalopram (LEXAPRO) 20 MG tablet Take 1 tablet (20 mg total) by mouth daily. 06/19/14  Yes Josalyn Funches, MD  furosemide (LASIX) 20 MG tablet TAKE 1 TABLET ONCE DAILY. 10/31/14  Yes Josalyn Funches, MD  levothyroxine (SYNTHROID, LEVOTHROID) 112 MCG tablet Take 1 tablet (112 mcg total) by mouth daily before breakfast. 06/20/14  Yes Josalyn Funches, MD  lisinopril (PRINIVIL,ZESTRIL) 10 MG tablet Take 1 tablet (10 mg total) by mouth daily. 09/19/14  Yes Josalyn Funches, MD  nicotine (NICODERM CQ - DOSED IN MG/24 HOURS) 21 mg/24hr patch Place 1 patch (21 mg total) onto the skin daily. 11/03/14  Yes Josalyn Funches, MD  nitroGLYCERIN (NITROSTAT) 0.4 MG SL tablet Place 1 tablet (0.4 mg total) under the tongue every 5 (five) minutes as needed for chest pain. 03/01/14  Yes Josalyn Funches, MD  pantoprazole (PROTONIX) 40 MG tablet Take 1 tablet (40 mg total) by mouth daily. 06/19/14  Yes Josalyn Funches, MD  potassium chloride SA (K-DUR,KLOR-CON) 20 MEQ tablet Take 1 tablet (20 mEq total) by mouth daily. 09/19/14  Yes Josalyn Funches, MD  prasugrel (EFFIENT) 10 MG TABS tablet Take 1 tablet (10 mg total) by mouth daily. 02/21/14  Yes Dessa Phi, MD  calcium citrate-vitamin D 500-400 MG-UNIT chewable tablet Chew 1 tablet by mouth 2 (two) times daily. Patient not taking: Reported on 12/15/2014 04/25/14   Dessa Phi, MD  nicotine (NICODERM CQ - DOSED IN MG/24 HOURS) 14 mg/24hr patch Place 1 patch (14 mg total) onto the skin daily. Patient not taking: Reported on 12/15/2014 11/03/14   Dessa Phi, MD  nicotine (NICODERM CQ - DOSED IN MG/24 HR) 7 mg/24hr patch Place 1 patch (7 mg total) onto the skin daily. Patient not taking: Reported on 12/15/2014 11/03/14   Josalyn Funches, MD   BP 120/67 mmHg  Pulse 73  Temp(Src) 98.1 F (36.7 C) (Oral)  Resp 18  Ht 5\' 8"  (1.727 m)  Wt 200  lb (90.719 kg)  BMI 30.42 kg/m2  SpO2 98% Physical Exam  Constitutional: She is oriented to person, place, and time. She appears well-developed and well-nourished.  HENT:  Head: Normocephalic and atraumatic.  Eyes: EOM are normal. Pupils are equal, round, and reactive to light.  Neck: Normal range of motion. Neck supple.  Cardiovascular: Normal rate, regular rhythm and normal heart sounds.   No murmur heard. Pulmonary/Chest: Effort normal and breath sounds normal. No respiratory distress. She has no wheezes. She has no rales.  Abdominal: Soft. Bowel sounds are normal. She exhibits no distension. There is no tenderness.  Musculoskeletal: Normal range of motion.  Neurological: She is alert and oriented to person, place, and time. No cranial nerve  deficit.  Skin: Skin is warm and dry.  Psychiatric: She has a normal mood and affect. Her speech is normal.  Nursing note and vitals reviewed.   ED Course  Procedures (including critical care time) Labs Review Labs Reviewed  I-STAT CHEM 8, ED - Abnormal; Notable for the following:    Potassium 3.3 (*)    Glucose, Bld 104 (*)    All other components within normal limits  CBC WITH DIFFERENTIAL/PLATELET  CBC  HEPARIN LEVEL (UNFRACTIONATED)  Rosezena Sensor, ED    Imaging Review Dg Chest Portable 1 View  12/28/2014   CLINICAL DATA:  Chest pain for 1 day.  EXAM: PORTABLE CHEST - 1 VIEW  COMPARISON:  Frontal and lateral views 11/03/2014  FINDINGS: The lungs remain hyperinflated with interstitial thickening. The cardiomediastinal contours are unchanged. Pulmonary vasculature is normal. There is trace blunting of the right costophrenic angle that is unchanged. No consolidation or pneumothorax. No acute osseous abnormalities are seen.  IMPRESSION: No acute pulmonary process.  Unchanged hyperinflation.   Electronically Signed   By: Rubye Oaks M.D.   On: 12/28/2014 23:45   I have personally reviewed and evaluated these images and lab results  as part of my medical decision-making.   EKG Interpretation   Date/Time:  Thursday December 28 2014 21:51:09 EDT Ventricular Rate:  70 PR Interval:  203 QRS Duration: 80 QT Interval:  401 QTC Calculation: 433 R Axis:   43 Text Interpretation:  Sinus rhythm Borderline prolonged PR interval Low  voltage, extremity and precordial leads Confirmed by Rubin Payor  MD, Harrold Donath  [phone]) on 12/28/2014 10:21:59 PM      MDM   Final diagnoses:  None    Patient with chest pain. States feels like previous angina and previous stenting. Pain improved with nitro drip but still present. Started on heparin at that time. EKG reassuring and troponin initially negative. Will admit to internal medicine.  CRITICAL CARE Performed by: Billee Cashing Total critical care time: 30 Critical care time was exclusive of separately billable procedures and treating other patients. Critical care was necessary to treat or prevent imminent or life-threatening deterioration. Critical care was time spent personally by me on the following activities: development of treatment plan with patient and/or surrogate as well as nursing, discussions with consultants, evaluation of patient's response to treatment, examination of patient, obtaining history from patient or surrogate, ordering and performing treatments and interventions, ordering and review of laboratory studies, ordering and review of radiographic studies, pulse oximetry and re-evaluation of patient's condition.       Benjiman Core, MD 12/29/14 [id number]

## 2014-12-29 ENCOUNTER — Encounter (HOSPITAL_COMMUNITY): Payer: Self-pay | Admitting: *Deleted

## 2014-12-29 ENCOUNTER — Encounter (HOSPITAL_COMMUNITY)
Admission: EM | Disposition: A | Discharge status: Home / Self Care | Payer: Medicaid Other | Source: Home / Self Care | Attending: Internal Medicine

## 2014-12-29 DIAGNOSIS — Z6828 Body mass index (BMI) 28.0-28.9, adult: Secondary | ICD-10-CM | POA: Diagnosis not present

## 2014-12-29 DIAGNOSIS — Z8249 Family history of ischemic heart disease and other diseases of the circulatory system: Secondary | ICD-10-CM | POA: Diagnosis not present

## 2014-12-29 DIAGNOSIS — Z955 Presence of coronary angioplasty implant and graft: Secondary | ICD-10-CM | POA: Diagnosis not present

## 2014-12-29 DIAGNOSIS — K210 Gastro-esophageal reflux disease with esophagitis: Secondary | ICD-10-CM

## 2014-12-29 DIAGNOSIS — I25119 Atherosclerotic heart disease of native coronary artery with unspecified angina pectoris: Secondary | ICD-10-CM | POA: Diagnosis not present

## 2014-12-29 DIAGNOSIS — G894 Chronic pain syndrome: Secondary | ICD-10-CM | POA: Diagnosis not present

## 2014-12-29 DIAGNOSIS — F411 Generalized anxiety disorder: Secondary | ICD-10-CM | POA: Diagnosis not present

## 2014-12-29 DIAGNOSIS — E663 Overweight: Secondary | ICD-10-CM | POA: Diagnosis present

## 2014-12-29 DIAGNOSIS — Z833 Family history of diabetes mellitus: Secondary | ICD-10-CM | POA: Diagnosis not present

## 2014-12-29 DIAGNOSIS — R079 Chest pain, unspecified: Secondary | ICD-10-CM | POA: Diagnosis present

## 2014-12-29 DIAGNOSIS — Z720 Tobacco use: Secondary | ICD-10-CM | POA: Diagnosis not present

## 2014-12-29 DIAGNOSIS — J449 Chronic obstructive pulmonary disease, unspecified: Secondary | ICD-10-CM | POA: Diagnosis present

## 2014-12-29 DIAGNOSIS — Z79899 Other long term (current) drug therapy: Secondary | ICD-10-CM | POA: Diagnosis not present

## 2014-12-29 DIAGNOSIS — Z7902 Long term (current) use of antithrombotics/antiplatelets: Secondary | ICD-10-CM | POA: Diagnosis not present

## 2014-12-29 DIAGNOSIS — Y831 Surgical operation with implant of artificial internal device as the cause of abnormal reaction of the patient, or of later complication, without mention of misadventure at the time of the procedure: Secondary | ICD-10-CM | POA: Diagnosis present

## 2014-12-29 DIAGNOSIS — I200 Unstable angina: Secondary | ICD-10-CM | POA: Diagnosis not present

## 2014-12-29 DIAGNOSIS — E785 Hyperlipidemia, unspecified: Secondary | ICD-10-CM | POA: Diagnosis present

## 2014-12-29 DIAGNOSIS — I25110 Atherosclerotic heart disease of native coronary artery with unstable angina pectoris: Secondary | ICD-10-CM | POA: Diagnosis present

## 2014-12-29 DIAGNOSIS — Z7982 Long term (current) use of aspirin: Secondary | ICD-10-CM | POA: Diagnosis not present

## 2014-12-29 DIAGNOSIS — I10 Essential (primary) hypertension: Secondary | ICD-10-CM

## 2014-12-29 DIAGNOSIS — I5022 Chronic systolic (congestive) heart failure: Secondary | ICD-10-CM

## 2014-12-29 DIAGNOSIS — T82858A Stenosis of vascular prosthetic devices, implants and grafts, initial encounter: Secondary | ICD-10-CM | POA: Diagnosis present

## 2014-12-29 DIAGNOSIS — F17210 Nicotine dependence, cigarettes, uncomplicated: Secondary | ICD-10-CM | POA: Diagnosis present

## 2014-12-29 DIAGNOSIS — E039 Hypothyroidism, unspecified: Secondary | ICD-10-CM | POA: Diagnosis present

## 2014-12-29 DIAGNOSIS — K219 Gastro-esophageal reflux disease without esophagitis: Secondary | ICD-10-CM | POA: Diagnosis present

## 2014-12-29 HISTORY — PX: CARDIAC CATHETERIZATION: SHX172

## 2014-12-29 LAB — TROPONIN I: Troponin I: <0.03 ng/mL (ref <0.031)

## 2014-12-29 LAB — BASIC METABOLIC PANEL
Anion gap: 10 (ref 5–15)
BUN: 7 mg/dL (ref 6–20)
CALCIUM: 8.8 mg/dL — AB (ref 8.9–10.3)
CO2: 22 mmol/L (ref 22–32)
Chloride: 109 mmol/L (ref 101–111)
Creatinine, Ser: 0.87 mg/dL (ref 0.44–1.00)
GFR calc Af Amer: >60 mL/min (ref >60)
GLUCOSE: 104 mg/dL — AB (ref 65–99)
Potassium: 4 mmol/L (ref 3.5–5.1)
Sodium: 141 mmol/L (ref 135–145)

## 2014-12-29 LAB — HEPARIN LEVEL (UNFRACTIONATED): HEPARIN UNFRACTIONATED: 0.34 [IU]/mL (ref 0.30–0.70)

## 2014-12-29 LAB — PROTIME-INR
INR: 1.11 (ref 0.00–1.49)
Prothrombin Time: 14.5 seconds (ref 11.6–15.2)

## 2014-12-29 LAB — MRSA PCR SCREENING: MRSA BY PCR: NEGATIVE

## 2014-12-29 LAB — POCT ACTIVATED CLOTTING TIME: Activated Clotting Time: 497 seconds

## 2014-12-29 SURGERY — LEFT HEART CATH AND CORONARY ANGIOGRAPHY
Anesthesia: LOCAL

## 2014-12-29 MED ORDER — DOCUSATE SODIUM 100 MG PO CAPS
100.0000 mg | ORAL_CAPSULE | Freq: Two times a day (BID) | ORAL | Status: DC
  Administered 2014-12-29 (×2): 100 mg via ORAL
  Filled 2014-12-29 (×3): qty 1

## 2014-12-29 MED ORDER — ANGIOPLASTY BOOK
Freq: Once | Status: AC
  Administered 2014-12-29: 21:00:00
  Filled 2014-12-29: qty 1

## 2014-12-29 MED ORDER — CLONAZEPAM 0.5 MG PO TABS
1.0000 mg | ORAL_TABLET | Freq: Two times a day (BID) | ORAL | Status: DC
  Administered 2014-12-29 – 2014-12-30 (×4): 1 mg via ORAL
  Filled 2014-12-29 (×2): qty 2
  Filled 2014-12-29 (×2): qty 1

## 2014-12-29 MED ORDER — BIVALIRUDIN 250 MG IV SOLR
INTRAVENOUS | Status: AC
  Filled 2014-12-29: qty 250

## 2014-12-29 MED ORDER — MIDAZOLAM HCL 2 MG/2ML IJ SOLN
INTRAMUSCULAR | Status: AC
  Filled 2014-12-29: qty 4

## 2014-12-29 MED ORDER — FENTANYL CITRATE (PF) 100 MCG/2ML IJ SOLN
INTRAMUSCULAR | Status: AC
  Filled 2014-12-29: qty 4

## 2014-12-29 MED ORDER — HEPARIN (PORCINE) IN NACL 100-0.45 UNIT/ML-% IJ SOLN
1150.0000 [IU]/h | INTRAMUSCULAR | Status: DC
  Administered 2014-12-29: 1150 [IU]/h via INTRAVENOUS
  Filled 2014-12-29: qty 250

## 2014-12-29 MED ORDER — SODIUM CHLORIDE 0.9 % IJ SOLN: 3.0000 mL | INTRAMUSCULAR | Status: DC | PRN

## 2014-12-29 MED ORDER — ASPIRIN EC 81 MG PO TBEC
81.0000 mg | DELAYED_RELEASE_TABLET | Freq: Every day | ORAL | Status: DC
  Administered 2014-12-29 – 2014-12-30 (×2): 81 mg via ORAL
  Filled 2014-12-29 (×2): qty 1

## 2014-12-29 MED ORDER — LEVOTHYROXINE SODIUM 112 MCG PO TABS
112.0000 ug | ORAL_TABLET | Freq: Every day | ORAL | Status: DC
  Administered 2014-12-29 – 2014-12-30 (×2): 112 ug via ORAL
  Filled 2014-12-29 (×3): qty 1

## 2014-12-29 MED ORDER — PANTOPRAZOLE SODIUM 40 MG PO TBEC
40.0000 mg | DELAYED_RELEASE_TABLET | Freq: Every day | ORAL | Status: DC
  Administered 2014-12-29 – 2014-12-30 (×2): 40 mg via ORAL
  Filled 2014-12-29 (×2): qty 1

## 2014-12-29 MED ORDER — HEPARIN BOLUS VIA INFUSION
4000.0000 [IU] | Freq: Once | INTRAVENOUS | Status: AC
  Administered 2014-12-29: 4000 [IU] via INTRAVENOUS
  Filled 2014-12-29: qty 4000

## 2014-12-29 MED ORDER — NITROGLYCERIN 1 MG/10 ML FOR IR/CATH LAB
INTRA_ARTERIAL | Status: DC | PRN
  Administered 2014-12-29: 16:00:00

## 2014-12-29 MED ORDER — NITROGLYCERIN IN D5W 200-5 MCG/ML-% IV SOLN: 0.0000 ug/min | INTRAVENOUS | Status: DC

## 2014-12-29 MED ORDER — HEPARIN (PORCINE) IN NACL 2-0.9 UNIT/ML-% IJ SOLN
INTRAMUSCULAR | Status: AC
  Filled 2014-12-29: qty 1000

## 2014-12-29 MED ORDER — ONDANSETRON HCL 4 MG/2ML IJ SOLN: 4.0000 mg | Freq: Four times a day (QID) | INTRAMUSCULAR | Status: DC | PRN

## 2014-12-29 MED ORDER — VERAPAMIL HCL 2.5 MG/ML IV SOLN
INTRAVENOUS | Status: AC
  Filled 2014-12-29: qty 2

## 2014-12-29 MED ORDER — NITROGLYCERIN 1 MG/10 ML FOR IR/CATH LAB
INTRA_ARTERIAL | Status: AC
  Filled 2014-12-29: qty 10

## 2014-12-29 MED ORDER — IOHEXOL 350 MG/ML SOLN
INTRAVENOUS | Status: DC | PRN
  Administered 2014-12-29: 130 mL via INTRACARDIAC

## 2014-12-29 MED ORDER — ALPRAZOLAM 0.25 MG PO TABS
0.2500 mg | ORAL_TABLET | Freq: Three times a day (TID) | ORAL | Status: DC | PRN
  Administered 2014-12-29: 0.25 mg via ORAL
  Filled 2014-12-29: qty 1

## 2014-12-29 MED ORDER — SODIUM CHLORIDE 0.9 % IV SOLN: 250.0000 mL | INTRAVENOUS | Status: DC | PRN

## 2014-12-29 MED ORDER — ESCITALOPRAM OXALATE 20 MG PO TABS
20.0000 mg | ORAL_TABLET | Freq: Every day | ORAL | Status: DC
  Administered 2014-12-29 – 2014-12-30 (×2): 20 mg via ORAL
  Filled 2014-12-29: qty 2
  Filled 2014-12-29: qty 1

## 2014-12-29 MED ORDER — PRASUGREL HCL 10 MG PO TABS
10.0000 mg | ORAL_TABLET | Freq: Every day | ORAL | Status: DC
  Administered 2014-12-29 – 2014-12-30 (×2): 10 mg via ORAL
  Filled 2014-12-29 (×2): qty 1

## 2014-12-29 MED ORDER — ACETAMINOPHEN 325 MG PO TABS
650.0000 mg | ORAL_TABLET | ORAL | Status: DC | PRN
  Administered 2014-12-29: 650 mg via ORAL
  Filled 2014-12-29: qty 2

## 2014-12-29 MED ORDER — MORPHINE SULFATE (PF) 2 MG/ML IV SOLN
2.0000 mg | INTRAVENOUS | Status: DC | PRN
  Administered 2014-12-29: 2 mg via INTRAVENOUS
  Filled 2014-12-29: qty 1

## 2014-12-29 MED ORDER — HEPARIN (PORCINE) IN NACL 100-0.45 UNIT/ML-% IJ SOLN: 1150.0000 [IU]/h | INTRAMUSCULAR | Status: DC

## 2014-12-29 MED ORDER — MIDAZOLAM HCL 2 MG/2ML IJ SOLN
INTRAMUSCULAR | Status: DC | PRN
  Administered 2014-12-29: 2 mg via INTRAVENOUS

## 2014-12-29 MED ORDER — NITROGLYCERIN IN D5W 200-5 MCG/ML-% IV SOLN: 0.0000 ug/min | Freq: Once | INTRAVENOUS | Status: DC

## 2014-12-29 MED ORDER — SODIUM CHLORIDE 0.9 % IV SOLN
INTRAVENOUS | Status: DC
  Administered 2014-12-29: 13:00:00 via INTRAVENOUS

## 2014-12-29 MED ORDER — NITROGLYCERIN 1 MG/10 ML FOR IR/CATH LAB
INTRA_ARTERIAL | Status: DC | PRN
  Administered 2014-12-29: 200 ug via INTRACORONARY

## 2014-12-29 MED ORDER — SODIUM CHLORIDE 0.9 % WEIGHT BASED INFUSION
3.0000 mL/kg/h | INTRAVENOUS | Status: AC
  Administered 2014-12-29: 3 mL/kg/h via INTRAVENOUS

## 2014-12-29 MED ORDER — BUPROPION HCL ER (SR) 150 MG PO TB12
150.0000 mg | ORAL_TABLET | Freq: Two times a day (BID) | ORAL | Status: DC
  Administered 2014-12-29 – 2014-12-30 (×2): 150 mg via ORAL
  Filled 2014-12-29 (×5): qty 1

## 2014-12-29 MED ORDER — POTASSIUM CHLORIDE CRYS ER 20 MEQ PO TBCR
20.0000 meq | EXTENDED_RELEASE_TABLET | Freq: Every day | ORAL | Status: DC
  Administered 2014-12-29 – 2014-12-30 (×2): 20 meq via ORAL
  Filled 2014-12-29: qty 1
  Filled 2014-12-29: qty 2

## 2014-12-29 MED ORDER — FUROSEMIDE 20 MG PO TABS
20.0000 mg | ORAL_TABLET | Freq: Every day | ORAL | Status: DC
  Administered 2014-12-30: 11:00:00 20 mg via ORAL
  Filled 2014-12-29: qty 1

## 2014-12-29 MED ORDER — HEPARIN SODIUM (PORCINE) 1000 UNIT/ML IJ SOLN
INTRAMUSCULAR | Status: DC | PRN
  Administered 2014-12-29: 3000 [IU] via INTRAVENOUS
  Administered 2014-12-29: 5000 [IU] via INTRAVENOUS

## 2014-12-29 MED ORDER — ATORVASTATIN CALCIUM 40 MG PO TABS
40.0000 mg | ORAL_TABLET | Freq: Every day | ORAL | Status: DC
  Administered 2014-12-29 (×2): 40 mg via ORAL
  Filled 2014-12-29 (×2): qty 1

## 2014-12-29 MED ORDER — FENTANYL CITRATE (PF) 100 MCG/2ML IJ SOLN
INTRAMUSCULAR | Status: DC | PRN
  Administered 2014-12-29: 25 ug via INTRAVENOUS

## 2014-12-29 MED ORDER — HEPARIN SODIUM (PORCINE) 1000 UNIT/ML IJ SOLN
INTRAMUSCULAR | Status: AC
  Filled 2014-12-29: qty 1

## 2014-12-29 MED ORDER — LIDOCAINE HCL (PF) 1 % IJ SOLN
INTRAMUSCULAR | Status: AC
  Filled 2014-12-29: qty 30

## 2014-12-29 MED ORDER — SODIUM CHLORIDE 0.9 % IJ SOLN: 3.0000 mL | Freq: Two times a day (BID) | INTRAMUSCULAR | Status: DC

## 2014-12-29 MED ORDER — NICOTINE 21 MG/24HR TD PT24
21.0000 mg | MEDICATED_PATCH | Freq: Every day | TRANSDERMAL | Status: DC
  Administered 2014-12-29 – 2014-12-30 (×3): 21 mg via TRANSDERMAL
  Filled 2014-12-29 (×3): qty 1

## 2014-12-29 MED ORDER — LISINOPRIL 10 MG PO TABS
10.0000 mg | ORAL_TABLET | Freq: Every day | ORAL | Status: DC
  Administered 2014-12-30: 11:00:00 10 mg via ORAL
  Filled 2014-12-29: qty 1

## 2014-12-29 MED ORDER — SODIUM CHLORIDE 0.9 % IJ SOLN
3.0000 mL | Freq: Two times a day (BID) | INTRAMUSCULAR | Status: DC
  Administered 2014-12-29: 3 mL via INTRAVENOUS

## 2014-12-29 MED ORDER — MORPHINE SULFATE (PF) 2 MG/ML IV SOLN
1.0000 mg | INTRAVENOUS | Status: DC | PRN
  Administered 2014-12-29: 1 mg via INTRAVENOUS
  Filled 2014-12-29: qty 1

## 2014-12-29 SURGICAL SUPPLY — 20 items
BALLN EMERGE MR 2.25X12 (BALLOONS) ×2
BALLN ~~LOC~~ EMERGE MR 2.75X12 (BALLOONS) ×2
BALLOON EMERGE MR 2.25X12 (BALLOONS) ×1 IMPLANT
BALLOON ~~LOC~~ EMERGE MR 2.75X12 (BALLOONS) ×1 IMPLANT
CATH INFINITI 5 FR JL3.5 (CATHETERS) ×2 IMPLANT
CATH INFINITI 5FR ANG PIGTAIL (CATHETERS) ×2 IMPLANT
CATH INFINITI JR4 5F (CATHETERS) ×2 IMPLANT
DEVICE RAD COMP TR BAND LRG (VASCULAR PRODUCTS) ×2 IMPLANT
GLIDESHEATH SLEND SS 6F .021 (SHEATH) ×2 IMPLANT
GUIDE CATH RUNWAY 6FR CLS3.5 (CATHETERS) ×2 IMPLANT
KIT ENCORE 26 ADVANTAGE (KITS) ×2 IMPLANT
KIT HEART LEFT (KITS) ×2 IMPLANT
PACK CARDIAC CATHETERIZATION (CUSTOM PROCEDURE TRAY) ×2 IMPLANT
STENT PROMUS PREM MR 2.5X12 (Permanent Stent) ×2 IMPLANT
SYR MEDRAD MARK V 150ML (SYRINGE) ×2 IMPLANT
TRANSDUCER W/STOPCOCK (MISCELLANEOUS) ×2 IMPLANT
TUBING CIL FLEX 10 FLL-RA (TUBING) ×2 IMPLANT
WIRE ASAHI PROWATER 180CM (WIRE) ×2 IMPLANT
WIRE PT2 MS 185 (WIRE) ×2 IMPLANT
WIRE SAFE-T 1.5MM-J .035X260CM (WIRE) ×2 IMPLANT

## 2014-12-29 NOTE — Progress Notes (Signed)
Patient's BP currently 88/57. She is still asymptomatic with low BP (no report of dizziness/lightheadedness, etc.); also with no complaint of chest pain/pressure.  Down-titrating IV nitroglycerin.  Continuing to monitor closely.

## 2014-12-29 NOTE — Progress Notes (Signed)
ANTICOAGULATION CONSULT NOTE   Pharmacy Consult for Heparin Indication: chest pain/ACS  No Known Allergies  Patient Measurements: Height:  (172.7 cm) Weight: 200 lb (90.719 kg) IBW/kg (Calculated) : 63.9 Heparin Dosing Weight: 83 kg  Vital Signs: Temp: 97.7 F (36.5 C) (09/16 0800) Temp Source: Oral (09/16 0800) BP: 112/66 mmHg (09/16 0800) Pulse Rate: 65 (09/16 0800)  Labs:  Recent Labs  12/28/14 2317 12/28/14 2325 12/29/14 0309 12/29/14 1020  HGB 13.4 13.9  --   --   HCT 38.9 41.0  --   --   PLT 218  --   --   --   HEPARINUNFRC  --   --   --  0.34  CREATININE  --  0.90  --   --   TROPONINI  --   --  <0.03 <0.03    Estimated Creatinine Clearance: 78.3 mL/min (by C-G formula based on Cr of 0.9).     Assessment: 59yo female with history of HLD, HTN, CAD and thyroid disease presents with CP. Pharmacy is consulted to dose heparin for ACS/chest pain. CBC is wnl, sCr 0.9, trop neg x1.  Heparin level therapeutic, for cath today  Goal of Therapy:  Heparin level 0.3-0.7 units/ml Monitor platelets by anticoagulation protocol: Yes   Plan:  Continue heparin at 1150 units / hr Follow up after cath  Thank you Okey Regal, PharmD [phone] 12/29/2014,12:31 PM

## 2014-12-29 NOTE — Progress Notes (Signed)
Patient admitted after midnight, please see H&P.  For cath today  Arnisha Laffoon DO 

## 2014-12-29 NOTE — Progress Notes (Signed)
Patient complained of chest pain 4/10 on arrival to floor from ED, requiring multiple interventions. Eventually, after multiple up-titrations, chest pain was responsive to IV nitroglycerin infusing at 50 mcg/min. Vital signs this AM show a drop in systolic/diastolic BP by approximately 30/10 mmHG, respectively.  Filed Vitals:   12/29/14 0400  BP: 93/56  Pulse: 62  Temp: 97.9 F (36.6 C)  Resp: 18   Patient is asymptomatic with reduced BP. Currently chest pain/pressure-free. IV NTG titrated down by 20 mcg/min.  Will recheck BP at 06:00.  Continuing to monitor.

## 2014-12-29 NOTE — Interval H&P Note (Signed)
History and Physical Interval Note:  12/29/2014 3:20 PM  Diana Erickson  has presented today for surgery, with the diagnosis of cp, cad  The various methods of treatment have been discussed with the patient and family. After consideration of risks, benefits and other options for treatment, the patient has consented to  Procedure(s): Left Heart Cath and Coronary Angiography (N/A) as a surgical intervention .  The patient's history has been reviewed, patient examined, no change in status, stable for surgery.  I have reviewed the patient's chart and labs.  Questions were answered to the patient's satisfaction.   Cath Lab Visit (complete for each Cath Lab visit)  Clinical Evaluation Leading to the Procedure:   ACS: Yes.    Non-ACS:    Anginal Classification: CCS IV  Anti-ischemic medical therapy: No Therapy  Non-Invasive Test Results: No non-invasive testing performed  Prior CABG: No previous CABG        Theron Arista [facility] 12/29/2014 3:20 PM

## 2014-12-29 NOTE — H&P (View-Only) (Signed)
  CARDIOLOGY CONSULT NOTE   Patient ID: Diana Erickson MRN: [id number] DOB/AGE: 01/10/1956 59 y.o.  Admit Date: 12/28/2014  Primary Physician: FUNCHES, JOSALYN C, MD  Primary Cardiologist     [place]   Clinical Summary Diana Erickson is a 59 y.o.female. She is admitted with chest pain. She has known coronary disease. There is a history of an ejection fraction in the 45-50% range. Beta blockers are not used because of resting bradycardia. She underwent PCI in October, 2015. She had a lesion in OM1. It was felt to be significant by IVUS. She has been on dual antiplatelets therapy since then. She was seen in the office in July, 2016. She was stable at that time. She has not had any prolonged chest discomfort since her PCI in 2015.  Yesterday she had marked chest pain with radiation to the left arm and left neck. She says that this felt like her original presenting symptom. She took nitroglycerin at home. There was relief. She then had a recurrent episode. 911 was called and she received more nitroglycerin. The discomfort again was mostly relieved. She has now been on heparin and nitroglycerin. Her blood pressure is low. Troponins are normal so far. EKG reveals no diagnostic change. Telemetry reveals normal sinus rhythm. Today she has very slight chest discomfort.  Other problems include hypothyroidism, anxiety, ongoing smoking.   No Known Allergies  Medications Scheduled Medications: . aspirin  81 mg Oral Daily  . atorvastatin  40 mg Oral q1800  . buPROPion  150 mg Oral BID WC  . clonazePAM  1 mg Oral BID  . escitalopram  20 mg Oral Daily  . furosemide  20 mg Oral Daily  . levothyroxine  112 mcg Oral QAC breakfast  . lisinopril  10 mg Oral Daily  . nicotine  21 mg Transdermal Daily  . pantoprazole  40 mg Oral Daily  . potassium chloride SA  20 mEq Oral Daily  . prasugrel  10 mg Oral Daily     Infusions: . heparin    . nitroGLYCERIN 20 mcg/min (12/29/14 0630)     PRN  Medications:  acetaminophen, ALPRAZolam, morphine injection, ondansetron (ZOFRAN) IV   Past Medical History  Diagnosis Date  . HLD (hyperlipidemia) Dx 2013  . Hypertension Dx 2007  . Thyroid disease 1990s  . Anxiety   . Depression   . CAD (coronary artery disease) October 2015    PCI to OM1  . Tobacco abuse     Past Surgical History  Procedure Laterality Date  . Appendectomy    . Tibia fracture surgery Right   . Cesarean section    . Left heart catheterization with coronary angiogram N/A 01/30/2014    Procedure: LEFT HEART CATHETERIZATION WITH CORONARY ANGIOGRAM;  Surgeon: Jonathan J Berry, MD;  Location: [facility] CATH LAB;  Service: Cardiovascular;  Laterality: N/A;    Family History  Problem Relation Age of Onset  . CAD Mother   . Diabetes type II Mother   . Cancer - Lung Father   . Diabetes type II Sister   . Cancer - Lung Sister   . Heart attack Sister   . Heart attack    . Heart failure Mother   . Hypertension Mother   . Hypertension Sister   . Hypertension Son   . Heart attack Mother     Social History Diana Erickson reports that she has been smoking Cigarettes.  She has a 25 pack-year smoking history. She does not have any smokeless tobacco history   on file. Diana Erickson reports that she drinks alcohol.  Review of Systems Patient denies fever, chills, headache, sweats, rash, change in vision, change in hearing, cough, nausea or vomiting, urinary symptoms. All other systems are reviewed and are negative.  Physical Examination Blood pressure 88/57, pulse 63, temperature 97.9 F (36.6 C), temperature source Oral, resp. rate 18, height 5' 8" (1.727 m), weight 200 lb (90.719 kg), SpO2 97 %.  Intake/Output Summary (Last 24 hours) at 12/29/14 0804 Last data filed at 12/29/14 0659  Gross per 24 hour  Intake 191.46 ml  Output    525 ml  Net -333.54 ml   Patient is overweight. She is comfortable lying in bed. She is oriented to person time and place. Affect is normal. Head  is atraumatic. Sclera and conjunctiva are normal. There is no jugular venous distention. Lungs reveal a few scattered rhonchi. Cardiac exam reveals an S1 and S2. Abdomen is soft. There is no peripheral edema. There are no musculoskeletal deformities. There are no skin rashes. Neurologic is grossly intact.  Lab Results  Basic Metabolic Panel:  Recent Labs Lab 12/28/14 2325  NA 142  K 3.3*  CL 105  GLUCOSE 104*  BUN 9  CREATININE 0.90    Liver Function Tests: No results for input(s): AST, ALT, ALKPHOS, BILITOT, PROT, ALBUMIN in the last 168 hours.  CBC:  Recent Labs Lab 12/28/14 2317 12/28/14 2325  WBC 8.7  --   NEUTROABS 5.7  --   HGB 13.4 13.9  HCT 38.9 41.0  MCV 97.5  --   PLT 218  --     Cardiac Enzymes:  Recent Labs Lab 12/29/14 0309  TROPONINI <0.03    BNP: Invalid input(s): POCBNP   Radiology: Dg Chest Portable 1 View  12/28/2014   CLINICAL DATA:  Chest pain for 1 day.  EXAM: PORTABLE CHEST - 1 VIEW  COMPARISON:  Frontal and lateral views 11/03/2014  FINDINGS: The lungs remain hyperinflated with interstitial thickening. The cardiomediastinal contours are unchanged. Pulmonary vasculature is normal. There is trace blunting of the right costophrenic angle that is unchanged. No consolidation or pneumothorax. No acute osseous abnormalities are seen.  IMPRESSION: No acute pulmonary process.  Unchanged hyperinflation.   Electronically Signed   By: Melanie  Ehinger M.D.   On: 12/28/2014 23:45     ECG: I have reviewed current and old EKGs. There is no significant change.  Telemetry:   I have reviewed telemetry today December 29, 2014. There is sinus rhythm with sinus bradycardia.   Impression and Recommendations    Tobacco abuse      Unfortunately she continues to smoke despite everyone's efforts to get her to stop. Once again she is counseled to stop.    CAD (coronary artery disease), native coronary artery    She has documented coronary disease. She  received a drug-eluting stent in 2015. She is undergoing type platelet therapy.     Anxiety state      There is a history of anxiety.    COPD (chronic obstructive pulmonary disease)     The patient has recently been evaluated I the pulmonary team.   Unstable angina    At this point it is difficult to be sure about the etiology of her current chest pain symptoms. However, she has not had significant pain since her PCI in 2015. The return of pain yesterday was significant and was new. In addition, she had pain with nitroglycerin relief followed by return of pain requiring more nitroglycerin. Her   enzymes are negative and EKG reveals no diagnostic change. I've carefully considered the diagnostic testing that would be most appropriate considering all factors. I feel that we should proceed with repeat cardiac catheterization. This will help give a definitive answer concerning her current symptoms. The patient is agreement and this study is being scheduled for today.    Jeff Grover Robinson, MD 12/29/2014, 8:04 AM   

## 2014-12-29 NOTE — Progress Notes (Signed)
ANTICOAGULATION CONSULT NOTE - Initial Consult  Pharmacy Consult for Heparin Indication: chest pain/ACS  No Known Allergies  Patient Measurements: Height:  (172.7 cm) Weight: 200 lb (90.719 kg) IBW/kg (Calculated) : 63.9 Heparin Dosing Weight: 83 kg  Vital Signs: Temp: 98.1 F (36.7 C) (09/15 2201) Temp Source: Oral (09/15 2201) BP: 120/67 mmHg (09/15 2345) Pulse Rate: 73 (09/15 2345)  Labs:  Recent Labs  12/28/14 2317 12/28/14 2325  HGB 13.4 13.9  HCT 38.9 41.0  PLT 218  --   CREATININE  --  0.90    Estimated Creatinine Clearance: 78.3 mL/min (by C-G formula based on Cr of 0.9).   Medical History: Past Medical History  Diagnosis Date  . HLD (hyperlipidemia) Dx 2013  . Hypertension Dx 2007  . Thyroid disease 1990s  . Anxiety   . Depression   . CAD (coronary artery disease) October 2015    PCI to OM1  . Tobacco abuse     Medications:   (Not in a hospital admission) Scheduled:   Infusions:    Assessment: 59yo female with history of HLD, HTN, CAD and thyroid disease presents with CP. Pharmacy is consulted to dose heparin for ACS/chest pain. CBC is wnl, sCr 0.9, trop neg x1.  Goal of Therapy:  Heparin level 0.3-0.7 units/ml Monitor platelets by anticoagulation protocol: Yes   Plan:  Give 4000 units bolus x 1 Start heparin infusion at 1150 units/hr Check anti-Xa level in 6 hours and daily while on heparin Continue to monitor H&H and platelets  Arlean Hopping. Newman Pies, PharmD Clinical Pharmacist Pager [phone] 12/29/2014,12:03 AM

## 2014-12-29 NOTE — H&P (Signed)
History and Physical  Diana Erickson ZOX:[id number] DOB: 08/04/1955 DOA: 12/28/2014  Referring physician: Benjiman Core, MD PCP: Lora Paula, MD   Chief Complaint: Chest pain  HPI: Diana Erickson is a 59 y.o. female with a past medical history significant for ASCVD s/p PCI to OM1 01/2014, chronic systolic dysfunction, hypothyroidism, anxiety, and hypertension and ongoing smoking who presents with two episodes of chest pressure.  The patient has had chest pain "occasionally" since her stent a year ago for which she takes nitrates, but today at noon, she had a "much more intense" substernal chest pressure and "tightness" that radiated to her jaw and into her abdomen and lasted two hours.  She describes this as identical to her angina during her ACS last year, and present at rest.  Later in the evening, around 8 pm yesterday, she had another episode, which continues until now not relieved with nitro.    In the ED, she had already taken aspirin 325 mg.  Her ECG showed small voltages but no new ST changes and an initial troponin was negative. She was started on heparin and nitroglycerin gtt and admitted to the floor.   Review of Systems:  Patient seen 0100 on 12/29/2014. Pt complains of chest pressure, shortness of breath. Pt denies any cough, leg swelling, palpitations, syncope. Otherwise, twelve systems were reviewed and were negative except as noted above in the history of present illness.  Past Medical History  Diagnosis Date  . HLD (hyperlipidemia) Dx 2013  . Hypertension Dx 2007  . Thyroid disease 1990s  . Anxiety   . Depression   . CAD (coronary artery disease) October 2015    PCI to OM1  . Tobacco abuse    Past Surgical History  Procedure Laterality Date  . Appendectomy    . Tibia fracture surgery Right   . Cesarean section    . Left heart catheterization with coronary angiogram N/A 01/30/2014    Procedure: LEFT HEART CATHETERIZATION WITH CORONARY ANGIOGRAM;   Surgeon: Runell Gess, MD;  Location: [facility] CATH LAB;  Service: Cardiovascular;  Laterality: N/A;   Social History:  reports that she has been smoking Cigarettes.  She has a 25 pack-year smoking history. She does not have any smokeless tobacco history on file. She reports that she drinks alcohol. She reports that she does not use illicit drugs. Patient lives with boyfiriend.  She is an ongoing smoker.  No alcohol.  She is currently unemployed.  No alcohol.  No Known Allergies  Family History  Problem Relation Age of Onset  . CAD Mother   . Diabetes type II Mother   . Cancer - Lung Father   . Diabetes type II Sister   . Cancer - Lung Sister   . Heart attack Sister   . Heart attack    . Heart failure Mother   . Hypertension Mother   . Hypertension Sister   . Hypertension Son   . Heart attack Mother   Mother with DM and coronary disease.  Sister with lung cancer.  Sister with breast cancer.  Father with lung cancer.   Prior to Admission medications   Medication Sig Start Date End Date Taking? Authorizing Provider  albuterol (PROVENTIL HFA;VENTOLIN HFA) 108 (90 BASE) MCG/ACT inhaler Inhale 2 puffs into the lungs every 6 (six) hours as needed for wheezing or shortness of breath. 11/06/14  Yes Josalyn Funches, MD  ALPRAZolam (XANAX) 0.25 MG tablet Take 1-2 tablets (0.25-0.5 mg total) by mouth 3 (three) times daily as  needed for anxiety. 0.25 mg during the day and 0.5 mg before bed 11/03/14  Yes Josalyn Funches, MD  aspirin 81 MG tablet Take 1 tablet (81 mg total) by mouth daily. 02/20/14  Yes Scott Moishe Spice, PA-C  atorvastatin (LIPITOR) 40 MG tablet Take 1 tablet (40 mg total) by mouth daily. 04/25/14  Yes Josalyn Funches, MD  buPROPion (WELLBUTRIN SR) 150 MG 12 hr tablet Take 1 tablet (150 mg total) by mouth 2 (two) times daily. 06/19/14  Yes Josalyn Funches, MD  clonazePAM (KLONOPIN) 1 MG tablet Take 1 tablet (1 mg total) by mouth 2 (two) times daily. 12/15/14  Yes Michele Mcalpine, MD    escitalopram (LEXAPRO) 20 MG tablet Take 1 tablet (20 mg total) by mouth daily. 06/19/14  Yes Josalyn Funches, MD  furosemide (LASIX) 20 MG tablet TAKE 1 TABLET ONCE DAILY. 10/31/14  Yes Josalyn Funches, MD  levothyroxine (SYNTHROID, LEVOTHROID) 112 MCG tablet Take 1 tablet (112 mcg total) by mouth daily before breakfast. 06/20/14  Yes Josalyn Funches, MD  lisinopril (PRINIVIL,ZESTRIL) 10 MG tablet Take 1 tablet (10 mg total) by mouth daily. 09/19/14  Yes Josalyn Funches, MD  nicotine (NICODERM CQ - DOSED IN MG/24 HOURS) 21 mg/24hr patch Place 1 patch (21 mg total) onto the skin daily. 11/03/14  Yes Josalyn Funches, MD  nitroGLYCERIN (NITROSTAT) 0.4 MG SL tablet Place 1 tablet (0.4 mg total) under the tongue every 5 (five) minutes as needed for chest pain. 03/01/14  Yes Josalyn Funches, MD  pantoprazole (PROTONIX) 40 MG tablet Take 1 tablet (40 mg total) by mouth daily. 06/19/14  Yes Josalyn Funches, MD  potassium chloride SA (K-DUR,KLOR-CON) 20 MEQ tablet Take 1 tablet (20 mEq total) by mouth daily. 09/19/14  Yes Josalyn Funches, MD  prasugrel (EFFIENT) 10 MG TABS tablet Take 1 tablet (10 mg total) by mouth daily. 02/21/14  Yes Dessa Phi, MD    Physical Exam: BP 104/64 mmHg  Pulse 64  Temp(Src) 97.7 F (36.5 C) (Oral)  Resp 20  Ht 5\' 8"  (1.727 m)  Wt 90.719 kg (200 lb)  BMI 30.42 kg/m2  SpO2 97% General: Adult female, alert and anxious.  Responds appropriately to questions.  Eye contact, dress and hygiene appropriate. HEENT: Corneas clear, conjunctivae and sclerae normal without injection or icterus, lids and lashes normal.  EOMI and PERL.  Nose normal.  OP moist without erythema, exudates, cobblestoning, or ulcers.  No airway deformities.  Neck supple.  No cervical lymphadenopathy or thyromegaly. Cardiac: RRR, nl S1-S2, no murmurs, rubs, gallops.  Capillary refill is less than 2 seconds.  No lower extremity edema.  No JVD. Respiratory: Normal respiratory rate and rhythm.  CTAB without rales or  wheezes. Abdomen: BS present.  No TTP or rebound all quadrants.  No masses or organomegaly.  No ascites, distension. Extremities: No deformities/injuries.  5/5 grip strength and upper extremity flexion/extension, symmetrically.  Extremities are warm and well-perfused. Neuro: Sensorium intact. Cranial nerves 3-12 intact. Speech is fluent.  Naming is grossly intact, and the patient's recall, recent and remote, as well as general fund of knowledge seem within normal limits.  Muscle tone normal, without fasciculations.  Moves all extremities equally and with normal coordination.  Attention span and concentration are within normal limits.          Labs on Admission:  Basic Metabolic Panel:  Recent Labs Lab 12/28/14 2325  NA 142  K 3.3*  CL 105  GLUCOSE 104*  BUN 9  CREATININE 0.90   Liver Function  Tests: No results for input(s): AST, ALT, ALKPHOS, BILITOT, PROT, ALBUMIN in the last 168 hours. No results for input(s): LIPASE, AMYLASE in the last 168 hours. No results for input(s): AMMONIA in the last 168 hours. CBC:  Recent Labs Lab 12/28/14 2317 12/28/14 2325  WBC 8.7  --   NEUTROABS 5.7  --   HGB 13.4 13.9  HCT 38.9 41.0  MCV 97.5  --   PLT 218  --    Cardiac Enzymes: Troponin I, POC 0.00 ng/mL       23:23  12/28/2014   Radiological Exams on Admission: Dg Chest Portable 1 View 12/28/2014   Personally reviewed. No acute disease.   EKG: Independently reviewed. Sinus, small voltages, consistent with COPD.  Assessment/Plan Present on Admission:  . Chest pain . Hypertension . Tobacco abuse . Chronic systolic heart failure . CAD (coronary artery disease), native coronary artery . Chronic pain syndrome . Anxiety state . GERD (gastroesophageal reflux disease) . COPD (chronic obstructive pulmonary disease) . Unstable angina   1. Unstable angina in patient with hx ACS/UA and PCI to OM1 in Oct 2015: The patient has stuttering rest pain, identical in character to her  previous angina.  Has ongoing pain.  Grace score 78, low risk. - heparin gtt - nitroglycerin gtt - Aspirin 325 mg already given, continue 81 mg daily - Continue home prasugrel - Consult to Cardiology, appreciate recommendations - Trend troponin - Continue home atorvastatin - morphine 2 mg IV PRN for pain  2. Hypertension: - Continue home home lisinopril  3. Chronic systolic HF: Stable. Continue home furosemide and supplemental K.  4. Anxiety: Stable.  Continue home escitalopram, bupropion, alprazolam PRN and clonazepam BID.  5. Chronic dyspnea in a smoker with minimal airflow obstruction: The patient was just recently evaluated in Pulm, her spirometry did not show obstruction, and she was recommended to take clonazepam and alprazolam as primary treatments for her dyspnea because it was felt that "chest wall factors" related to anxiety were primarily driving her symptoms. - BZD as above  6. GERD: Continue homePPI  7. Hypothyroidism: Continue home levothyroxine  8. Smoking: Smoking cessation encouraged.  Will continue nicotine patch as inpatient, defer to Cardiology.      DVT PPx: Heparin gtt  Diet: NPO for possible stress tomorrow  Consultants: Cardiology  Code Status: Full    Disposition Plan:  The appropriate admission status for this patient is INPATIENT. Inpatient status is judged to be reasonable and necessary in order to provide the required intensity of service to ensure the patient's safety. The patient's presenting symptoms, physical exam findings, and initial radiographic and laboratory data in the context of their chronic comorbidities is felt to place them at high risk for further clinical deterioration. Furthermore, it is not anticipated that the patient will be medically stable for discharge from the hospital within 2 midnights of admission. The following factors support the admission status of inpatient.   A. The patient's presenting symptoms include  chest pressure, radiating into jaw. B. The chronic co-morbidities include coronary artery disease, ongoing smoking. C. Patient requires inpatient status due to high intensity of service, high risk for further deterioration and high frequency of surveillance required. D. I certify that at the point of admission it is my clinical judgment that the patient will require inpatient hospital care spanning beyond 2 midnights from the point of admission.     Alberteen Sam [facility] Pager [phone]

## 2014-12-29 NOTE — Consult Note (Signed)
CARDIOLOGY CONSULT NOTE   Patient ID: Diana Erickson MRN: [id number] DOB/AGE: Dec 05, 1955 59 y.o.  Admit Date: 12/28/2014  Primary Physician: Lora Paula, MD  Primary Cardiologist     Tenny Craw   Clinical Summary Ms. Diana Erickson is a 59 y.o.female. She is admitted with chest pain. She has known coronary disease. There is a history of an ejection fraction in the 45-50% range. Beta blockers are not used because of resting bradycardia. She underwent PCI in October, 2015. She had a lesion in OM1. It was felt to be significant by IVUS. She has been on dual antiplatelets therapy since then. She was seen in the office in July, 2016. She was stable at that time. She has not had any prolonged chest discomfort since her PCI in 2015.  Yesterday she had marked chest pain with radiation to the left arm and left neck. She says that this felt like her original presenting symptom. She took nitroglycerin at home. There was relief. She then had a recurrent episode. 911 was called and she received more nitroglycerin. The discomfort again was mostly relieved. She has now been on heparin and nitroglycerin. Her blood pressure is low. Troponins are normal so far. EKG reveals no diagnostic change. Telemetry reveals normal sinus rhythm. Today she has very slight chest discomfort.  Other problems include hypothyroidism, anxiety, ongoing smoking.   No Known Allergies  Medications Scheduled Medications: . aspirin  81 mg Oral Daily  . atorvastatin  40 mg Oral q1800  . buPROPion  150 mg Oral BID WC  . clonazePAM  1 mg Oral BID  . escitalopram  20 mg Oral Daily  . furosemide  20 mg Oral Daily  . levothyroxine  112 mcg Oral QAC breakfast  . lisinopril  10 mg Oral Daily  . nicotine  21 mg Transdermal Daily  . pantoprazole  40 mg Oral Daily  . potassium chloride SA  20 mEq Oral Daily  . prasugrel  10 mg Oral Daily     Infusions: . heparin    . nitroGLYCERIN 20 mcg/min (12/29/14 0630)     PRN  Medications:  acetaminophen, ALPRAZolam, morphine injection, ondansetron (ZOFRAN) IV   Past Medical History  Diagnosis Date  . HLD (hyperlipidemia) Dx 2013  . Hypertension Dx 2007  . Thyroid disease 1990s  . Anxiety   . Depression   . CAD (coronary artery disease) October 2015    PCI to OM1  . Tobacco abuse     Past Surgical History  Procedure Laterality Date  . Appendectomy    . Tibia fracture surgery Right   . Cesarean section    . Left heart catheterization with coronary angiogram N/A 01/30/2014    Procedure: LEFT HEART CATHETERIZATION WITH CORONARY ANGIOGRAM;  Surgeon: Runell Gess, MD;  Location: [facility] CATH LAB;  Service: Cardiovascular;  Laterality: N/A;    Family History  Problem Relation Age of Onset  . CAD Mother   . Diabetes type II Mother   . Cancer - Lung Father   . Diabetes type II Sister   . Cancer - Lung Sister   . Heart attack Sister   . Heart attack    . Heart failure Mother   . Hypertension Mother   . Hypertension Sister   . Hypertension Son   . Heart attack Mother     Social History Ms. Diana Erickson reports that she has been smoking Cigarettes.  She has a 25 pack-year smoking history. She does not have any smokeless tobacco history  on file. Ms. Diana Erickson reports that she drinks alcohol.  Review of Systems Patient denies fever, chills, headache, sweats, rash, change in vision, change in hearing, cough, nausea or vomiting, urinary symptoms. All other systems are reviewed and are negative.  Physical Examination Blood pressure 88/57, pulse 63, temperature 97.9 F (36.6 C), temperature source Oral, resp. rate 18, height 5\' 8"  (1.727 m), weight 200 lb (90.719 kg), SpO2 97 %.  Intake/Output Summary (Last 24 hours) at 12/29/14 0804 Last data filed at 12/29/14 0659  Gross per 24 hour  Intake 191.46 ml  Output    525 ml  Net -333.54 ml   Patient is overweight. She is comfortable lying in bed. She is oriented to person time and place. Affect is normal. Head  is atraumatic. Sclera and conjunctiva are normal. There is no jugular venous distention. Lungs reveal a few scattered rhonchi. Cardiac exam reveals an S1 and S2. Abdomen is soft. There is no peripheral edema. There are no musculoskeletal deformities. There are no skin rashes. Neurologic is grossly intact.  Lab Results  Basic Metabolic Panel:  Recent Labs Lab 12/28/14 2325  NA 142  K 3.3*  CL 105  GLUCOSE 104*  BUN 9  CREATININE 0.90    Liver Function Tests: No results for input(s): AST, ALT, ALKPHOS, BILITOT, PROT, ALBUMIN in the last 168 hours.  CBC:  Recent Labs Lab 12/28/14 2317 12/28/14 2325  WBC 8.7  --   NEUTROABS 5.7  --   HGB 13.4 13.9  HCT 38.9 41.0  MCV 97.5  --   PLT 218  --     Cardiac Enzymes:  Recent Labs Lab 12/29/14 0309  TROPONINI <0.03    BNP: Invalid input(s): POCBNP   Radiology: Dg Chest Portable 1 View  12/28/2014   CLINICAL DATA:  Chest pain for 1 day.  EXAM: PORTABLE CHEST - 1 VIEW  COMPARISON:  Frontal and lateral views 11/03/2014  FINDINGS: The lungs remain hyperinflated with interstitial thickening. The cardiomediastinal contours are unchanged. Pulmonary vasculature is normal. There is trace blunting of the right costophrenic angle that is unchanged. No consolidation or pneumothorax. No acute osseous abnormalities are seen.  IMPRESSION: No acute pulmonary process.  Unchanged hyperinflation.   Electronically Signed   By: Rubye Oaks M.D.   On: 12/28/2014 23:45     ECG: I have reviewed current and old EKGs. There is no significant change.  Telemetry:   I have reviewed telemetry today December 29, 2014. There is sinus rhythm with sinus bradycardia.   Impression and Recommendations    Tobacco abuse      Unfortunately she continues to smoke despite everyone's efforts to get her to stop. Once again she is counseled to stop.    CAD (coronary artery disease), native coronary artery    She has documented coronary disease. She  received a drug-eluting stent in 2015. She is undergoing type platelet therapy.     Anxiety state      There is a history of anxiety.    COPD (chronic obstructive pulmonary disease)     The patient has recently been evaluated I the pulmonary team.   Unstable angina    At this point it is difficult to be sure about the etiology of her current chest pain symptoms. However, she has not had significant pain since her PCI in 2015. The return of pain yesterday was significant and was new. In addition, she had pain with nitroglycerin relief followed by return of pain requiring more nitroglycerin. Her  enzymes are negative and EKG reveals no diagnostic change. I've carefully considered the diagnostic testing that would be most appropriate considering all factors. I feel that we should proceed with repeat cardiac catheterization. This will help give a definitive answer concerning her current symptoms. The patient is agreement and this study is being scheduled for today.    Jerral Bonito, MD 12/29/2014, 8:04 AM

## 2014-12-30 ENCOUNTER — Encounter (HOSPITAL_COMMUNITY): Payer: Self-pay | Admitting: *Deleted

## 2014-12-30 DIAGNOSIS — R079 Chest pain, unspecified: Secondary | ICD-10-CM

## 2014-12-30 DIAGNOSIS — F411 Generalized anxiety disorder: Secondary | ICD-10-CM

## 2014-12-30 DIAGNOSIS — Z720 Tobacco use: Secondary | ICD-10-CM

## 2014-12-30 DIAGNOSIS — G894 Chronic pain syndrome: Secondary | ICD-10-CM

## 2014-12-30 LAB — CBC
HCT: 35.5 % — ABNORMAL LOW (ref 36.0–46.0)
Hemoglobin: 12.1 g/dL (ref 12.0–15.0)
MCH: 33.4 pg (ref 26.0–34.0)
MCHC: 34.1 g/dL (ref 30.0–36.0)
MCV: 98.1 fL (ref 78.0–100.0)
PLATELETS: 193 10*3/uL (ref 150–400)
RBC: 3.62 MIL/uL — ABNORMAL LOW (ref 3.87–5.11)
RDW: 13.7 % (ref 11.5–15.5)
WBC: 6.5 10*3/uL (ref 4.0–10.5)

## 2014-12-30 LAB — BASIC METABOLIC PANEL
ANION GAP: 6 (ref 5–15)
BUN: 8 mg/dL (ref 6–20)
CALCIUM: 8.3 mg/dL — AB (ref 8.9–10.3)
CO2: 22 mmol/L (ref 22–32)
CREATININE: 0.8 mg/dL (ref 0.44–1.00)
Chloride: 113 mmol/L — ABNORMAL HIGH (ref 101–111)
Glucose, Bld: 103 mg/dL — ABNORMAL HIGH (ref 65–99)
POTASSIUM: 3.8 mmol/L (ref 3.5–5.1)
Sodium: 141 mmol/L (ref 135–145)

## 2014-12-30 NOTE — Discharge Summary (Signed)
Physician Discharge Summary  Diana Erickson NWG:[id number] DOB: 10-06-1955 DOA: 12/28/2014  PCP: Lora Paula, MD  Admit date: 12/28/2014 Discharge date: 12/30/2014  Time spent: 35 minutes  Recommendations for Outpatient Follow-up:  She needs at least one year of DAPT If HR stays above 50 consider adding BB Pain management referral  Discharge Diagnoses:  Active Problems:   Tobacco abuse   Hypertension   Chronic systolic heart failure   CAD (coronary artery disease), native coronary artery   Chronic pain syndrome   Anxiety state   GERD (gastroesophageal reflux disease)   COPD (chronic obstructive pulmonary disease)   Chest pain   Unstable angina   Discharge Condition: improved  Diet recommendation: cardiac  Filed Weights   12/29/14 0200 12/29/14 0400 12/30/14 0352  Weight: 90.719 kg (200 lb) 90.719 kg (200 lb) 84.7 kg (186 lb 11.7 oz)    History of present illness:  Diana Erickson is a 59 y.o. female with a past medical history significant for ASCVD s/p PCI to OM1 01/2014, chronic systolic dysfunction, hypothyroidism, anxiety, and hypertension and ongoing smoking who presents with two episodes of chest pressure.  The patient has had chest pain "occasionally" since her stent a year ago for which she takes nitrates, but today at noon, she had a "much more intense" substernal chest pressure and "tightness" that radiated to her jaw and into her abdomen and lasted two hours. She describes this as identical to her angina during her ACS last year, and present at rest. Later in the evening, around 8 pm yesterday, she had another episode, which continues until now not relieved with nitro.   In the ED, she had already taken aspirin 325 mg. Her ECG showed small voltages but no new ST changes and an initial troponin was negative. She was started on heparin and nitroglycerin gtt and admitted to the floor.  Hospital Course:  Unstable angina - s/p cath 09/16 with in-stent  restenosis of the OM1, treated with DES.  - She needs at least one year of DAPT - On ASA, statin, Effient - HR was < 50s at times- if improves, consider adding BB  Procedures:  cath  Consultations:  cards  Discharge Exam: Filed Vitals:   12/30/14 0930  BP: 110/60  Pulse:   Temp:   Resp:     General: A+Ox3, NAD   Discharge Instructions   Discharge Instructions    Amb Referral to Cardiac Rehabilitation    Complete by:  As directed   Congestive Heart Failure: If diagnosis is Heart Failure, patient MUST meet each of the CMS criteria: 1. Left Ventricular Ejection Fraction </= 35% 2. NYHA class II-IV symptoms despite being on optimal heart failure therapy for at least 6 weeks. 3. Stable = have not had a recent (<6 weeks) or planned (<6 months) major cardiovascular hospitalization or procedure  Program Details: - Physician supervised classes - 1-3 classes per week over a 12-18 week period, generally for a total of 36 sessions  Physician Certification: I certify that the above Cardiac Rehabilitation treatment is medically necessary and is medically approved by me for treatment of this patient. The patient is willing and cooperative, able to ambulate and medically stable to participate in exercise rehabilitation. The participant's progress and Individualized Treatment Plan will be reviewed by the Medical Director, Cardiac Rehab staff and as indicated by the Referring/Ordering Physician.  Diagnosis:  PCI Comment - referring to [facility]     Diet - low sodium heart healthy    Complete by:  As directed      Increase activity slowly    Complete by:  As directed           Discharge Medication List as of 12/30/2014 12:36 PM    CONTINUE these medications which have NOT CHANGED   Details  albuterol (PROVENTIL HFA;VENTOLIN HFA) 108 (90 BASE) MCG/ACT inhaler Inhale 2 puffs into the lungs every 6 (six) hours as needed for wheezing or shortness of breath., Starting 11/06/2014, Until  Discontinued, Normal    ALPRAZolam (XANAX) 0.25 MG tablet Take 1-2 tablets (0.25-0.5 mg total) by mouth 3 (three) times daily as needed for anxiety. 0.25 mg during the day and 0.5 mg before bed, Starting 11/03/2014, Until Discontinued, Print    aspirin 81 MG tablet Take 1 tablet (81 mg total) by mouth daily., Starting 02/20/2014, Until Discontinued, OTC    atorvastatin (LIPITOR) 40 MG tablet Take 1 tablet (40 mg total) by mouth daily., Starting 04/25/2014, Until Discontinued, Normal    buPROPion (WELLBUTRIN SR) 150 MG 12 hr tablet Take 1 tablet (150 mg total) by mouth 2 (two) times daily., Starting 06/19/2014, Until Discontinued, Normal    clonazePAM (KLONOPIN) 1 MG tablet Take 1 tablet (1 mg total) by mouth 2 (two) times daily., Starting 12/15/2014, Until Discontinued, Print    escitalopram (LEXAPRO) 20 MG tablet Take 1 tablet (20 mg total) by mouth daily., Starting 06/19/2014, Until Discontinued, Normal    furosemide (LASIX) 20 MG tablet TAKE 1 TABLET ONCE DAILY., Normal    levothyroxine (SYNTHROID, LEVOTHROID) 112 MCG tablet Take 1 tablet (112 mcg total) by mouth daily before breakfast., Starting 06/20/2014, Until Discontinued, Normal    lisinopril (PRINIVIL,ZESTRIL) 10 MG tablet Take 1 tablet (10 mg total) by mouth daily., Starting 09/19/2014, Until Discontinued, Normal    nicotine (NICODERM CQ - DOSED IN MG/24 HOURS) 21 mg/24hr patch Place 1 patch (21 mg total) onto the skin daily., Starting 11/03/2014, Until Discontinued, Normal    nitroGLYCERIN (NITROSTAT) 0.4 MG SL tablet Place 1 tablet (0.4 mg total) under the tongue every 5 (five) minutes as needed for chest pain., Starting 03/01/2014, Until Discontinued, Normal    pantoprazole (PROTONIX) 40 MG tablet Take 1 tablet (40 mg total) by mouth daily., Starting 06/19/2014, Until Discontinued, Normal    potassium chloride SA (K-DUR,KLOR-CON) 20 MEQ tablet Take 1 tablet (20 mEq total) by mouth daily., Starting 09/19/2014, Until Discontinued, Normal     prasugrel (EFFIENT) 10 MG TABS tablet Take 1 tablet (10 mg total) by mouth daily., Starting 02/21/2014, Until Discontinued, Normal       No Known Allergies Follow-up Information    Follow up with Lora Paula, MD In 1 week.   Specialty:  Family Medicine   Contact information:   [address] [place] [address] [phone]        The results of significant diagnostics from this hospitalization (including imaging, microbiology, ancillary and laboratory) are listed below for reference.    Significant Diagnostic Studies: Dg Chest Portable 1 View  12/28/2014   CLINICAL DATA:  Chest pain for 1 day.  EXAM: PORTABLE CHEST - 1 VIEW  COMPARISON:  Frontal and lateral views 11/03/2014  FINDINGS: The lungs remain hyperinflated with interstitial thickening. The cardiomediastinal contours are unchanged. Pulmonary vasculature is normal. There is trace blunting of the right costophrenic angle that is unchanged. No consolidation or pneumothorax. No acute osseous abnormalities are seen.  IMPRESSION: No acute pulmonary process.  Unchanged hyperinflation.   Electronically Signed   By: Lujean Rave.D.  On: 12/28/2014 23:45    Microbiology: Recent Results (from the past 240 hour(s))  MRSA PCR Screening     Status: None   Collection Time: 12/29/14  2:44 AM  Result Value Ref Range Status   MRSA by PCR NEGATIVE NEGATIVE Final    Comment:        The GeneXpert MRSA Assay (FDA approved for NASAL specimens only), is one component of a comprehensive MRSA colonization surveillance program. It is not intended to diagnose MRSA infection nor to guide or monitor treatment for MRSA infections.      Labs: Basic Metabolic Panel:  Recent Labs Lab 12/28/14 2325 12/29/14 1225 12/30/14 0407  NA 142 141 141  K 3.3* 4.0 3.8  CL 105 109 113*  CO2  --  22 22  GLUCOSE 104* 104* 103*  BUN CREATININE 0.90 0.87 0.80  CALCIUM  --  8.8* 8.3*   Liver Function Tests: No results for  input(s): AST, ALT, ALKPHOS, BILITOT, PROT, ALBUMIN in the last 168 hours. No results for input(s): LIPASE, AMYLASE in the last 168 hours. No results for input(s): AMMONIA in the last 168 hours. CBC:  Recent Labs Lab 12/28/14 2317 12/28/14 2325 12/30/14 0407  WBC 8.7  --  6.5  NEUTROABS 5.7  --   --   HGB 13.4 13.9 12.1  HCT 38.9 41.0 35.5*  MCV 97.5  --  98.1  PLT 218  --  193   Cardiac Enzymes:  Recent Labs Lab 12/29/14 0309 12/29/14 1020 12/29/14 1225 12/29/14 1848  TROPONINI <0.03 <0.03 <0.03 <0.03   BNP: BNP (last 3 results) No results for input(s): BNP in the last 8760 hours.  ProBNP (last 3 results)  Recent Labs  01/28/14 2106 11/03/14 1413  PROBNP 42.1 29.0    CBG: No results for input(s): GLUCAP in the last 168 hours.     SignedMarlin Canary  [facility] 12/30/2014, 3:48 PM

## 2014-12-30 NOTE — Progress Notes (Signed)
TR BAND REMOVAL  LOCATION:    right radial  DEFLATED PER PROTOCOL:    Yes.    TIME BAND OFF / DRESSING APPLIED:    2100   SITE UPON ARRIVAL:    Level 0  SITE AFTER BAND REMOVAL:    Level 0  REVERSE ALLEN'S TEST:     positive  CIRCULATION SENSATION AND MOVEMENT:    Within Normal Limits   Yes.    COMMENTS:    

## 2014-12-30 NOTE — Progress Notes (Signed)
CARDIAC REHAB PHASE I   PRE:  Rate/Rhythm: 70 SR  BP:  Supine: 110/60  Sitting:   Standing:    SaO2:   MODE:  Ambulation: 500 ft   POST:  Rate/Rhythm: 135 ST walking, 77 SR sitting on bed  BP:  Supine:   Sitting: 136/60  Standing:    SaO2:  1610-9604 Pt walked 500 ft with steady gait. Heart rate up with activity and down with rest. No c/o CP. Education completed with pt who voiced understanding. Gave smoking cessation sheet and fake cigarette. Pt has a nicotine patch on and has acces to them she stated. Discussed walking instructions for ex and referring to [place] Phase 2. Stressed importance of attending.   Luetta Nutting, RN BSN  12/30/2014 9:55 AM

## 2014-12-30 NOTE — Progress Notes (Signed)
Patient Name: Diana Erickson Date of Encounter: 12/30/2014  Active Problems:   Tobacco abuse   Hypertension   Chronic systolic heart failure   CAD (coronary artery disease), native coronary artery   Chronic pain syndrome   Anxiety state   GERD (gastroesophageal reflux disease)   COPD (chronic obstructive pulmonary disease)   Chest pain   Unstable angina   Primary Cardiologist: Dr Tenny Craw  Patient Profile: 59 yo female w/ hx PCI OM1 01/2014, HTN, HL, Tob use, admitted 09/16 with chest pain relieved by nitroglycerin.  SUBJECTIVE: No more chest pain, no SOB. Not sure she can quit tobacco but will try. Other people in the house smoke.  OBJECTIVE Filed Vitals:   12/29/14 1928 12/29/14 2000 12/30/14 0352 12/30/14 0723  BP: 136/57  121/62 134/73  Pulse: 64  69 71  Temp: 97.7 F (36.5 C)  98.2 F (36.8 C) 98.1 F (36.7 C)  TempSrc: Oral  Oral Oral  Resp: Height:      Weight:   186 lb 11.7 oz (84.7 kg)   SpO2: 98%  93% 96%    Intake/Output Summary (Last 24 hours) at 12/30/14 0741 Last data filed at 12/30/14 0700  Gross per 24 hour  Intake 1490.48 ml  Output   1300 ml  Net 190.48 ml   Filed Weights   12/29/14 0200 12/29/14 0400 12/30/14 0352  Weight: 200 lb (90.719 kg) 200 lb (90.719 kg) 186 lb 11.7 oz (84.7 kg)    PHYSICAL EXAM General: Well developed, well nourished, female in no acute distress. Head: Normocephalic, atraumatic.  Neck: Supple without bruits, JVD not elevated. Lungs:  Resp regular and unlabored, few rales. Heart: RRR, S1, S2, no S3, S4, or murmur; no rub. Abdomen: Soft, non-tender, non-distended, BS + x 4.  Extremities: No clubbing, cyanosis, edema. Right radial cath site without ecchymosis or hematoma Neuro: Alert and oriented X 3. Moves all extremities spontaneously. Psych: Normal affect.  LABS: CBC: Recent Labs  12/28/14 2317 12/28/14 2325 12/30/14 0407  WBC 8.7  --  6.5  NEUTROABS 5.7  --   --   HGB 13.4 13.9 12.1    HCT 38.9 41.0 35.5*  MCV 97.5  --  98.1  PLT 218  --  193   INR: Recent Labs  12/29/14 1225  INR 1.11   Basic Metabolic Panel: Recent Labs  12/29/14 1225 12/30/14 0407  NA 141 141  K 4.0 3.8  CL 109 113*  CO2 22 22  GLUCOSE 104* 103*  BUN 7 8  CREATININE 0.87 0.80  CALCIUM 8.8* 8.3*   Cardiac Enzymes: Recent Labs  12/29/14 1020 12/29/14 1225 12/29/14 1848  TROPONINI <0.03 <0.03 <0.03    Recent Labs  12/28/14 2323  TROPIPOC 0.00   TELE: SR, S brady       ECG: 12/30/2014 Sinus rhythm, rate 65 No acute ischemic changes Mildly low voltage.  Cath: 12/29/2014  Ost LM to LM lesion, 25% stenosed.  Ost 1st Mrg to 1st Mrg lesion, 80% stenosed.  The left ventricular systolic function is normal.  1st Mrg lesion, 80% stenosed. There is a 0% residual stenosis post intervention. A drug-eluting stent was placed. The lesion was previously treated with a drug-eluting stent six to twelve months ago. 1. Single vessel obstructive CAD 2. Normal LV function. 3. Successful stenting of the ostial/proximal OM 1 with a DES.  Plan: DAPT for one year. Anticipate DC in am.   Radiology/Studies: Dg Chest Portable 1  View 12/28/2014   CLINICAL DATA:  Chest pain for 1 day.  EXAM: PORTABLE CHEST - 1 VIEW  COMPARISON:  Frontal and lateral views 11/03/2014  FINDINGS: The lungs remain hyperinflated with interstitial thickening. The cardiomediastinal contours are unchanged. Pulmonary vasculature is normal. There is trace blunting of the right costophrenic angle that is unchanged. No consolidation or pneumothorax. No acute osseous abnormalities are seen.  IMPRESSION: No acute pulmonary process.  Unchanged hyperinflation.   Electronically Signed   By: Rubye Oaks M.D.   On: 12/28/2014 23:45     Current Medications:  . aspirin  81 mg Oral Daily  . atorvastatin  40 mg Oral q1800  . buPROPion  150 mg Oral BID WC  . clonazePAM  1 mg Oral BID  . docusate sodium  100 mg Oral BID  .  escitalopram  20 mg Oral Daily  . furosemide  20 mg Oral Daily  . levothyroxine  112 mcg Oral QAC breakfast  . lisinopril  10 mg Oral Daily  . nicotine  21 mg Transdermal Daily  . pantoprazole  40 mg Oral Daily  . potassium chloride SA  20 mEq Oral Daily  . prasugrel  10 mg Oral Daily      ASSESSMENT AND PLAN:  Unstable angina - s/p cath 09/16 with in-stent restenosis of the OM1, treated with DES.  - She needs at least one year of DAPT - On ASA, statin, Effient - Heart rate is high 50s at time, patient not currently on a beta blocker - M.D. advise on adding Coreg 3.125 mg twice a day.  Otherwise, per IM, no further cardiac workup Active Problems:   Tobacco abuse   Hypertension   Chronic systolic heart failure   CAD (coronary artery disease), native coronary artery   Chronic pain syndrome   Anxiety state   GERD (gastroesophageal reflux disease)   COPD (chronic obstructive pulmonary disease)   Chest pain     Signed, Leanna Battles 7:41 AM 12/30/2014   Outcomes and instruction reviewed

## 2015-01-01 ENCOUNTER — Encounter (HOSPITAL_COMMUNITY): Payer: Self-pay | Admitting: Cardiology

## 2015-01-01 MED FILL — Verapamil HCl IV Soln 2.5 MG/ML: INTRAVENOUS | Qty: 2 | Status: AC

## 2015-01-03 ENCOUNTER — Telehealth: Payer: Self-pay | Admitting: *Deleted

## 2015-01-04 ENCOUNTER — Telehealth: Payer: Self-pay

## 2015-01-04 DIAGNOSIS — F411 Generalized anxiety disorder: Secondary | ICD-10-CM

## 2015-01-04 MED ORDER — ALPRAZOLAM 0.25 MG PO TABS: 0.2500 mg | ORAL_TABLET | Freq: Three times a day (TID) | ORAL | Status: DC | PRN

## 2015-01-04 NOTE — Telephone Encounter (Signed)
Please inform patient that Xanax refused  As she was recently prescribed  60 klonopin by Dr. Kriste Basque on 12/15/2014

## 2015-01-04 NOTE — Telephone Encounter (Signed)
Patient called and requested to speak to PCP's nurse. Please f/u with pt.

## 2015-01-04 NOTE — Telephone Encounter (Signed)
Refused as patient was given 60 klonopin on 12/15/14

## 2015-01-05 NOTE — Telephone Encounter (Signed)
Pt informed No Xanax refill at this time  Was given #60 by Dr Kriste Basque on 12/15/2014

## 2015-01-11 ENCOUNTER — Telehealth: Payer: Self-pay | Admitting: *Deleted

## 2015-01-11 DIAGNOSIS — F411 Generalized anxiety disorder: Secondary | ICD-10-CM

## 2015-01-11 MED ORDER — ALPRAZOLAM 0.25 MG PO TABS: 0.2500 mg | ORAL_TABLET | Freq: Three times a day (TID) | ORAL | Status: DC | PRN

## 2015-01-12 ENCOUNTER — Ambulatory Visit: Payer: Medicaid Other | Admitting: Pulmonary Disease

## 2015-01-12 NOTE — Telephone Encounter (Signed)
Pt requesting Rx Xanax to be fax to [facility] at #[phone] Rx faxed

## 2015-01-22 ENCOUNTER — Encounter (HOSPITAL_COMMUNITY): Payer: Self-pay | Admitting: Cardiology

## 2015-02-07 ENCOUNTER — Other Ambulatory Visit: Payer: Self-pay | Admitting: Family Medicine

## 2015-02-07 DIAGNOSIS — I25110 Atherosclerotic heart disease of native coronary artery with unstable angina pectoris: Secondary | ICD-10-CM

## 2015-02-08 NOTE — Telephone Encounter (Signed)
Regarding lasix,  Is she experiencing swelling or SOB in setting of weight gain? Her EF (ejection fraction) on recent cardiac cath done in September was normal.  Patient advised to f/u with cardiology to evaluate if lasix is needed.

## 2015-02-12 ENCOUNTER — Other Ambulatory Visit: Payer: Self-pay | Admitting: *Deleted

## 2015-02-12 ENCOUNTER — Telehealth: Payer: Self-pay | Admitting: *Deleted

## 2015-02-12 ENCOUNTER — Other Ambulatory Visit: Payer: Self-pay | Admitting: Family Medicine

## 2015-02-12 DIAGNOSIS — F411 Generalized anxiety disorder: Secondary | ICD-10-CM

## 2015-02-12 DIAGNOSIS — I5022 Chronic systolic (congestive) heart failure: Secondary | ICD-10-CM

## 2015-02-12 MED ORDER — POTASSIUM CHLORIDE CRYS ER 20 MEQ PO TBCR: 20.0000 meq | EXTENDED_RELEASE_TABLET | Freq: Every day | ORAL | Status: DC

## 2015-02-12 MED ORDER — ALPRAZOLAM 0.25 MG PO TABS: 0.2500 mg | ORAL_TABLET | Freq: Three times a day (TID) | ORAL | Status: DC | PRN

## 2015-02-12 MED ORDER — FUROSEMIDE 20 MG PO TABS: 20.0000 mg | ORAL_TABLET | Freq: Every day | ORAL | Status: DC

## 2015-02-12 NOTE — Telephone Encounter (Signed)
Pt requesting Xanax and all medication refills Stated insurance will lapsed this week

## 2015-02-12 NOTE — Telephone Encounter (Signed)
Rx Xanax called in to pharmacy  Rx refills  Pt notified

## 2015-03-01 NOTE — Telephone Encounter (Signed)
Pt stated her insurance expired . [facility] was [facility]  Call was transfer to front office for appointment

## 2015-03-14 ENCOUNTER — Telehealth: Payer: Self-pay | Admitting: *Deleted

## 2015-03-14 DIAGNOSIS — F411 Generalized anxiety disorder: Secondary | ICD-10-CM

## 2015-03-14 MED ORDER — ALPRAZOLAM 0.25 MG PO TABS: 0.2500 mg | ORAL_TABLET | Freq: Three times a day (TID) | ORAL | Status: DC | PRN

## 2015-03-14 NOTE — Telephone Encounter (Signed)
Xanax refilled Please inform patient

## 2015-03-16 ENCOUNTER — Ambulatory Visit: Payer: Medicaid Other | Admitting: Family Medicine

## 2015-03-20 ENCOUNTER — Ambulatory Visit: Payer: Medicaid Other | Admitting: Family Medicine

## 2015-04-10 ENCOUNTER — Other Ambulatory Visit: Payer: Self-pay | Admitting: Family Medicine

## 2015-04-10 ENCOUNTER — Encounter: Payer: Self-pay | Admitting: Family Medicine

## 2015-04-10 ENCOUNTER — Ambulatory Visit: Payer: Medicaid Other | Attending: Family Medicine | Admitting: Family Medicine

## 2015-04-10 VITALS — BP 122/76 | HR 77 | Temp 98.6°F | Resp 18 | Ht 68.0 in | Wt 205.0 lb

## 2015-04-10 DIAGNOSIS — F411 Generalized anxiety disorder: Secondary | ICD-10-CM

## 2015-04-10 DIAGNOSIS — I10 Essential (primary) hypertension: Secondary | ICD-10-CM

## 2015-04-10 DIAGNOSIS — Z79899 Other long term (current) drug therapy: Secondary | ICD-10-CM | POA: Insufficient documentation

## 2015-04-10 DIAGNOSIS — F329 Major depressive disorder, single episode, unspecified: Secondary | ICD-10-CM | POA: Insufficient documentation

## 2015-04-10 DIAGNOSIS — E039 Hypothyroidism, unspecified: Secondary | ICD-10-CM | POA: Diagnosis not present

## 2015-04-10 DIAGNOSIS — K219 Gastro-esophageal reflux disease without esophagitis: Secondary | ICD-10-CM | POA: Diagnosis not present

## 2015-04-10 DIAGNOSIS — Z7982 Long term (current) use of aspirin: Secondary | ICD-10-CM | POA: Insufficient documentation

## 2015-04-10 DIAGNOSIS — Z720 Tobacco use: Secondary | ICD-10-CM

## 2015-04-10 DIAGNOSIS — I5022 Chronic systolic (congestive) heart failure: Secondary | ICD-10-CM

## 2015-04-10 DIAGNOSIS — J449 Chronic obstructive pulmonary disease, unspecified: Secondary | ICD-10-CM

## 2015-04-10 DIAGNOSIS — I2510 Atherosclerotic heart disease of native coronary artery without angina pectoris: Secondary | ICD-10-CM | POA: Diagnosis not present

## 2015-04-10 DIAGNOSIS — Z0000 Encounter for general adult medical examination without abnormal findings: Secondary | ICD-10-CM

## 2015-04-10 DIAGNOSIS — F17210 Nicotine dependence, cigarettes, uncomplicated: Secondary | ICD-10-CM | POA: Insufficient documentation

## 2015-04-10 LAB — TSH: TSH: 27.013 u[IU]/mL — AB (ref 0.350–4.500)

## 2015-04-10 MED ORDER — ALBUTEROL SULFATE HFA 108 (90 BASE) MCG/ACT IN AERS: 2.0000 | INHALATION_SPRAY | Freq: Four times a day (QID) | RESPIRATORY_TRACT | Status: DC | PRN

## 2015-04-10 MED ORDER — ALPRAZOLAM 0.5 MG PO TABS: 0.2500 mg | ORAL_TABLET | Freq: Two times a day (BID) | ORAL | Status: DC | PRN

## 2015-04-10 MED ORDER — PANTOPRAZOLE SODIUM 40 MG PO TBEC: 40.0000 mg | DELAYED_RELEASE_TABLET | Freq: Every day | ORAL | Status: DC

## 2015-04-10 MED ORDER — VARENICLINE TARTRATE 1 MG PO TABS: 1.0000 mg | ORAL_TABLET | Freq: Two times a day (BID) | ORAL | Status: DC

## 2015-04-10 MED ORDER — ALPRAZOLAM 0.25 MG PO TABS: 0.2500 mg | ORAL_TABLET | Freq: Three times a day (TID) | ORAL | Status: DC | PRN

## 2015-04-10 MED ORDER — ATORVASTATIN CALCIUM 40 MG PO TABS: 40.0000 mg | ORAL_TABLET | Freq: Every day | ORAL | Status: AC

## 2015-04-10 MED ORDER — FUROSEMIDE 20 MG PO TABS: 20.0000 mg | ORAL_TABLET | Freq: Every day | ORAL | Status: AC

## 2015-04-10 MED ORDER — PANTOPRAZOLE SODIUM 40 MG PO TBEC: 40.0000 mg | DELAYED_RELEASE_TABLET | Freq: Every day | ORAL | Status: AC

## 2015-04-10 MED ORDER — TIOTROPIUM BROMIDE MONOHYDRATE 18 MCG IN CAPS: 18.0000 ug | ORAL_CAPSULE | Freq: Every day | RESPIRATORY_TRACT | Status: DC

## 2015-04-10 MED ORDER — POTASSIUM CHLORIDE CRYS ER 20 MEQ PO TBCR: 20.0000 meq | EXTENDED_RELEASE_TABLET | Freq: Every day | ORAL | Status: AC

## 2015-04-10 MED ORDER — PRASUGREL HCL 10 MG PO TABS: 10.0000 mg | ORAL_TABLET | Freq: Every day | ORAL | Status: DC

## 2015-04-10 MED ORDER — VARENICLINE TARTRATE 0.5 MG X 11 & 1 MG X 42 PO MISC: ORAL | Status: DC

## 2015-04-10 NOTE — Assessment & Plan Note (Signed)
Known CAD, smoker. Medicaid has ran out  Smoking cessation Effient refilled to onsite pharmacy so patient can apply for medication assistance

## 2015-04-10 NOTE — Assessment & Plan Note (Addendum)
A: smoking with worsening SOB P: Smoking cessation, chantix spiriva Albuterol prn Referred her back to pulmonology

## 2015-04-10 NOTE — Assessment & Plan Note (Addendum)
A; smoker with known CAD, CHF, likely COPD P: Smoking cessation, starting chantix  Patient counseled on risk of suicidal ideation  and nightmares she knows to stop medicine and contact me right away of she develops worsening suicidal ideation.

## 2015-04-10 NOTE — Assessment & Plan Note (Signed)
A: BP well controlled P: Continue lisinopril and lasix

## 2015-04-10 NOTE — Progress Notes (Signed)
F/U TSH HTN  Medication question and refills  Tobacco user 1/2 ppday  Pain scale # 7 leg pain No suicidal thought in the past two weeks

## 2015-04-10 NOTE — Assessment & Plan Note (Signed)
Due for recheck of TFTs  done today

## 2015-04-10 NOTE — Patient Instructions (Addendum)
Diana Erickson was seen today for hypertension.  Diagnoses and all orders for this visit:  Hypothyroidism, unspecified hypothyroidism type -     TSH  Anxiety state -     Discontinue: ALPRAZolam (XANAX) 0.25 MG tablet; Take 1-2 tablets (0.25-0.5 mg total) by mouth 3 (three) times daily as needed for anxiety. 0.25 mg during the day and 0.5 mg before bed -     ALPRAZolam (XANAX) 0.5 MG tablet; Take 0.5-1 tablets (0.25-0.5 mg total) by mouth 2 (two) times daily as needed for anxiety. 0.25 mg during the day and 0.5 mg before bed  Coronary artery disease   -     atorvastatin (LIPITOR) 40 MG tablet; Take 1 tablet (40 mg total) by mouth daily. -     prasugrel (EFFIENT) 10 MG TABS tablet; Take 1 tablet (10 mg total) by mouth daily.  Coronary artery disease involving native coronary artery of native heart without angina pectoris -     atorvastatin (LIPITOR) 40 MG tablet; Take 1 tablet (40 mg total) by mouth daily. -     prasugrel (EFFIENT) 10 MG TABS tablet; Take 1 tablet (10 mg total) by mouth daily.  Gastroesophageal reflux disease, esophagitis presence not specified -     Discontinue: pantoprazole (PROTONIX) 40 MG tablet; Take 1 tablet (40 mg total) by mouth daily. -     pantoprazole (PROTONIX) 40 MG tablet; Take 1 tablet (40 mg total) by mouth daily.  Chronic systolic heart failure (HCC) -     furosemide (LASIX) 20 MG tablet; Take 1 tablet (20 mg total) by mouth daily.  Tobacco abuse -     varenicline (CHANTIX CONTINUING MONTH PAK) 1 MG tablet; Take 1 tablet (1 mg total) by mouth 2 (two) times daily. -     varenicline (CHANTIX PAK) 0.5 MG X 11 & 1 MG X 42 tablet; Taper per packet insert  Chronic obstructive pulmonary disease, unspecified COPD type (HCC) -     Cancel: Ambulatory referral to Pulmonology -     Ambulatory referral to Pulmonology  Chronic obstructive pulmonary disease, unspecified COPD, unspecified chronic bronchitis type -     albuterol (PROVENTIL HFA;VENTOLIN HFA) 108 (90 BASE)  MCG/ACT inhaler; Inhale 2 puffs into the lungs every 6 (six) hours as needed for wheezing or shortness of breath. -     tiotropium (SPIRIVA HANDIHALER) 18 MCG inhalation capsule; Place 1 capsule (18 mcg total) into inhaler and inhale daily.  Start chantix: Take one 0.5 mg tablet by mouth once daily for 3 days, then increase to one 0.5 mg tablet twice daily for 4 days, then increase to one 1 mg tablet twice daily. Set quit date for 8-35 days after starting chantix.  Smoking cessation support: smoking cessation hotline: 1-800-QUIT-NOW.  Smoking cessation classes are available through [facility]. Call [phone] or visit our website at [email or web address].   Apply for PASS at the onsite pharmacy for effient and chantix., spiriva inhaler for SOB.   Albuterol, lasix, lipitor, protonix, lisinopril also refilled on site.   You will be called with lab results  F/u in 6-10 weeks for smoking cessation, once on chantix for at least 3 weeks  Dr. Rhae HammockFunches  [facility] Phone: [phone] Location: Lower level of [facility] ([address]) Hours: 7:30 a.m. to 6:00 p.m., Monday through Friday.

## 2015-04-10 NOTE — Assessment & Plan Note (Signed)
Refilled xanax If at f/u with pulmonology with recommendation is still to add klonopin for symptom control. I will order this. Preferably patient's BZs are ordered by one provider.

## 2015-04-10 NOTE — Progress Notes (Signed)
Subjective:  Patient ID: Diana Erickson, female    DOB: 1955/05/09  Age: 59 y.o. MRN: [id number]  CC: Hypertension   HPI [name] presents for   1. CHRONIC HYPERTENSION  Disease Monitoring  Blood pressure range: not checking   Chest pain: no   Dyspnea: yes   Claudication: no   Medication compliance: yes  Medication Side Effects  Lightheadedness: yes   Urinary frequency: no   Edema: no      2. SOB: she is SOB at the time she reports. SOB associated with fatigue and depressed mood. She continues to smoke 1/2 PPD. She is not taking lexapro. She is not taking wellbutrin. She reports that wellbutrin did not hlep with her mood. She did not f/u for screening CT chest ordered by pulmonology. She did not keep f/u with pulmonology. She reports that she was worried about the possibility of lung cancer and did not want to know. She reports intermittent wheezing. She has gained weight over past 3 months and this distresses her. She is also distressed that she reportedly has lost her medicaid coverage.   3. Smoking; she knows she needs to quit. She desires to quit. She denies SI and nightmare during our conversation, although she endorsed intermittent thoughts that she would be better off dead on the screening questionnaire. She is amenable to chantix for smoking cessation.   Social History  Substance Use Topics  . Smoking status: Current Every Day Smoker -- 1.00 packs/day for 25 years    Types: Cigarettes  . Smokeless tobacco: Not on file  . Alcohol Use: 0.0 oz/week    0 Standard drinks or equivalent per week     Comment: occasional    Outpatient Prescriptions Prior to Visit  Medication Sig Dispense Refill  . albuterol (PROVENTIL HFA;VENTOLIN HFA) 108 (90 BASE) MCG/ACT inhaler Inhale 2 puffs into the lungs every 6 (six) hours as needed for wheezing or shortness of breath. 1 Inhaler 0  . ALPRAZolam (XANAX) 0.25 MG tablet Take 1-2 tablets (0.25-0.5 mg total) by mouth 3 (three)  times daily as needed for anxiety. 0.25 mg during the day and 0.5 mg before bed 90 tablet 0  . aspirin 81 MG tablet Take 1 tablet (81 mg total) by mouth daily.    Marland Kitchen atorvastatin (LIPITOR) 40 MG tablet Take 1 tablet (40 mg total) by mouth daily. 90 tablet 3  . buPROPion (WELLBUTRIN SR) 150 MG 12 hr tablet Take 1 tablet (150 mg total) by mouth 2 (two) times daily. 60 tablet 1  . clonazePAM (KLONOPIN) 1 MG tablet Take 1 tablet (1 mg total) by mouth 2 (two) times daily. 60 tablet o  . escitalopram (LEXAPRO) 20 MG tablet Take 1 tablet (20 mg total) by mouth daily. 30 tablet 2  . furosemide (LASIX) 20 MG tablet Take 1 tablet (20 mg total) by mouth daily. 30 tablet 0  . levothyroxine (SYNTHROID, LEVOTHROID) 112 MCG tablet Take 1 tablet (112 mcg total) by mouth daily before breakfast. 90 tablet 3  . lisinopril (PRINIVIL,ZESTRIL) 10 MG tablet Take 1 tablet (10 mg total) by mouth daily. 30 tablet 5  . nicotine (NICODERM CQ - DOSED IN MG/24 HOURS) 21 mg/24hr patch Place 1 patch (21 mg total) onto the skin daily. 42 patch 0  . nitroGLYCERIN (NITROSTAT) 0.4 MG SL tablet Place 1 tablet (0.4 mg total) under the tongue every 5 (five) minutes as needed for chest pain. 25 tablet 3  . pantoprazole (PROTONIX) 40 MG tablet Take 1 tablet (  40 mg total) by mouth daily. 90 tablet 3  . potassium chloride SA (K-DUR,KLOR-CON) 20 MEQ tablet Take 1 tablet (20 mEq total) by mouth daily. 30 tablet 5  . prasugrel (EFFIENT) 10 MG TABS tablet Take 1 tablet (10 mg total) by mouth daily. 90 tablet 3   No facility-administered medications prior to visit.    ROS Review of Systems  Constitutional: Positive for unexpected weight change (weight gain ). Negative for fever and chills.  Eyes: Negative for visual disturbance.  Respiratory: Positive for shortness of breath and wheezing. Negative for cough.   Cardiovascular: Negative for chest pain, palpitations and leg swelling.  Gastrointestinal: Negative for abdominal pain and blood in  stool.  Endocrine: Negative for polyphagia.  Musculoskeletal: Negative for back pain and arthralgias.  Skin: Negative for rash.  Allergic/Immunologic: Negative for immunocompromised state.  Hematological: Negative for adenopathy. Does not bruise/bleed easily.  Psychiatric/Behavioral: Positive for sleep disturbance and dysphoric mood. Negative for suicidal ideas. The patient is nervous/anxious.     Objective:  BP 122/76 mmHg  Pulse 77  Temp(Src) 98.6 F (37 C) (Oral)  Resp 18  Ht  (1.727 m)  Wt 205 lb (92.987 kg)  BMI 31.18 kg/m2  SpO2 97%  Resting O2 98% Ambulatory O2 97%  BP/Weight 04/10/2015 12/30/2014 12/15/2014  Systolic BP 122 110 112  Diastolic BP 76 60 76  Wt. (Lbs) 205 186.73 200.6  BMI 31.18 28.4 29.61    Physical Exam  Constitutional: She is oriented to person, place, and time. She appears well-developed and well-nourished. No distress.  HENT:  Head: Normocephalic and atraumatic.  Cardiovascular: Normal rate, regular rhythm, normal heart sounds and intact distal pulses.   Pulmonary/Chest: Effort normal and breath sounds normal.  Musculoskeletal: She exhibits no edema.  Neurological: She is alert and oriented to person, place, and time.  Skin: Skin is warm and dry. No rash noted.  Psychiatric: She has a normal mood and affect.   Treated with duoneb x one with subjective improvement in SOB   Depression screen [facility] 2/9 04/10/2015 11/03/2014 06/19/2014 03/28/2014 03/01/2014  Decreased Interest 3 3 0 3 0  Down, Depressed, Hopeless 3 3 0 3 0  PHQ - 2 Score 6 6 0 6 0  Altered sleeping 3 3 - 3 -  Tired, decreased energy 3 3 - 3 -  Change in appetite 3 3 - 3 -  Feeling bad or failure about yourself  1 2 - 3 -  Trouble concentrating 1 0 - 3 -  Moving slowly or fidgety/restless 1 1 - 3 -  Suicidal thoughts 1 1 - 0 -  PHQ-9 Score 19 19 - 24 -    GAD 7 : Generalized Anxiety Score 04/10/2015  Nervous, Anxious, on Edge 3  Control/stop worrying 3  Worry too much -  different things 3  Trouble relaxing 3  Restless 3  Easily annoyed or irritable 3  Afraid - awful might happen 3  Total GAD 7 Score 21      Assessment & Plan:   Problem List Items Addressed This Visit    Anxiety state (Chronic)    Refilled xanax If at f/u with pulmonology with recommendation is still to add klonopin for symptom control. I will order this. Preferably patient's BZs are ordered by one provider.       Relevant Medications   ALPRAZolam (XANAX) 0.5 MG tablet   CAD (coronary artery disease), native coronary artery (Chronic)    Known CAD, smoker. Medicaid has ran  out  Smoking cessation Effient refilled to onsite pharmacy so patient can apply for medication assistance       Relevant Medications   atorvastatin (LIPITOR) 40 MG tablet   prasugrel (EFFIENT) 10 MG TABS tablet   furosemide (LASIX) 20 MG tablet   Chronic systolic heart failure (HCC) (Chronic)   Relevant Medications   atorvastatin (LIPITOR) 40 MG tablet   furosemide (LASIX) 20 MG tablet   potassium chloride SA (K-DUR,KLOR-CON) 20 MEQ tablet   COPD (chronic obstructive pulmonary disease) (HCC)    A: smoking with worsening SOB P: Smoking cessation, chantix spiriva Albuterol prn Referred her back to pulmonology       Relevant Medications   varenicline (CHANTIX CONTINUING MONTH PAK) 1 MG tablet   varenicline (CHANTIX PAK) 0.5 MG X 11 & 1 MG X 42 tablet   albuterol (PROVENTIL HFA;VENTOLIN HFA) 108 (90 BASE) MCG/ACT inhaler   tiotropium (SPIRIVA HANDIHALER) 18 MCG inhalation capsule   Other Relevant Orders   Ambulatory referral to Pulmonology   GERD (gastroesophageal reflux disease) (Chronic)   Relevant Medications   pantoprazole (PROTONIX) 40 MG tablet   Hypertension    A: BP well controlled P: Continue lisinopril and lasix       Relevant Medications   atorvastatin (LIPITOR) 40 MG tablet   furosemide (LASIX) 20 MG tablet   Hypothyroidism - Primary (Chronic)    Due for recheck of TFTs  done  today       Relevant Orders   TSH   Tobacco abuse (Chronic)    A; smoker with known CAD, CHF, likely COPD P: Smoking cessation, starting chantix  Patient counseled on risk of suicidal ideation  and nightmares she knows to stop medicine and contact me right away of she develops worsening suicidal ideation.       Relevant Medications   varenicline (CHANTIX CONTINUING MONTH PAK) 1 MG tablet   varenicline (CHANTIX PAK) 0.5 MG X 11 & 1 MG X 42 tablet    Other Visit Diagnoses    Chronic obstructive pulmonary disease, unspecified COPD, unspecified chronic bronchitis type        Relevant Medications    varenicline (CHANTIX CONTINUING MONTH PAK) 1 MG tablet    varenicline (CHANTIX PAK) 0.5 MG X 11 & 1 MG X 42 tablet    albuterol (PROVENTIL HFA;VENTOLIN HFA) 108 (90 BASE) MCG/ACT inhaler    tiotropium (SPIRIVA HANDIHALER) 18 MCG inhalation capsule    Healthcare maintenance        Relevant Orders    MM DIGITAL SCREENING BILATERAL       No orders of the defined types were placed in this encounter.    Follow-up: No Follow-up on file.   Dessa PhiJosalyn Lemmie Steinhaus MD

## 2015-04-13 ENCOUNTER — Other Ambulatory Visit: Payer: Self-pay | Admitting: Family Medicine

## 2015-04-13 DIAGNOSIS — Z1231 Encounter for screening mammogram for malignant neoplasm of breast: Secondary | ICD-10-CM

## 2015-04-17 ENCOUNTER — Telehealth: Payer: Self-pay

## 2015-04-17 DIAGNOSIS — E039 Hypothyroidism, unspecified: Secondary | ICD-10-CM

## 2015-04-17 MED ORDER — LEVOTHYROXINE SODIUM 125 MCG PO TABS: 125.0000 ug | ORAL_TABLET | Freq: Every day | ORAL | Status: AC

## 2015-04-17 NOTE — Telephone Encounter (Signed)
Spoke with patient this am  Patient states she is taking her thyroid medication as prescribed Patient would like to be called back at [phone] and to sent her medication To [facility]

## 2015-04-17 NOTE — Addendum Note (Signed)
Addended by: Dessa PhiFUNCHES, Giovanny Dugal on: 04/17/2015 11:18 AM   Modules accepted: Orders

## 2015-04-17 NOTE — Telephone Encounter (Signed)
Called patient back to let her know a new RX for her thyroid  Medications has been sent to the [facility] in [place]

## 2015-04-17 NOTE — Telephone Encounter (Signed)
-----   Message from Dessa PhiJosalyn Funches, MD sent at 04/10/2015  4:05 PM EST ----- TSH is very high, This is hypothyroidism This will cause weight gain, depressed mood and fatigue is she really taking the synthroid everyday as prescribed, in the AM on empty stomach?  If so, I need to increase her dose, if  Not she needs to take.

## 2015-04-17 NOTE — Telephone Encounter (Signed)
Please inform patient  Synthroid dose has been increased to 125 mcg daily and sent to [facility] in [place] Repeat TSH in 12 weeks needed

## 2015-04-19 ENCOUNTER — Ambulatory Visit: Payer: Medicaid Other | Admitting: Cardiology

## 2015-04-30 ENCOUNTER — Ambulatory Visit: Payer: Medicaid Other | Admitting: Pulmonary Disease

## 2015-04-30 ENCOUNTER — Ambulatory Visit: Payer: Medicaid Other | Admitting: Cardiology

## 2015-05-08 ENCOUNTER — Telehealth: Payer: Self-pay | Admitting: *Deleted

## 2015-05-08 NOTE — Telephone Encounter (Signed)
Pt left voice message to return call. Today at 10:00 Returned pt call. Pt stated need Xanax refills.  Rx not due to Feb 27. Pt aware will call back on that day

## 2015-05-09 NOTE — Progress Notes (Signed)
Cardiology Office Note   Date:  05/11/2015   ID:  Diana Erickson, DOB 1955/06/17, MRN [id number]  PCP:  Lora Paula, MD  Cardiologist:  Dr. Tenny Craw    Follow up   History of Present Illness: Diana Erickson is a 60 y.o. female with a history of HTN, HLD, CAD s/p PCI/DES to OM1 (01/2014); PCI/DES for ISR of OM1 (12/2014), tobacco abuse, and mild LV dysfunction who presents to clinic for routine follow up.   Admitted October of 2015 with chest pain c/w unstable angina. CEs remained negative. EF was 45-50% by echo. LHC demonstrated borderline lesion in OM1. This was significant by IVUS and patient underwent PCI with DES to OM1.   Last seen in January - felt to be doing well. Continued to smoke.  Seen in 10/2014 by Norma Fredrickson NP for issues of anxiety and shortness of breath. Smoking cessation recommended.   Seen by Dr. Kriste Basque in 12/2012 for ongoing dyspnea. He felt it was mostly related to anxiety and recommended scheduled klonipin.   She was admitted to [facility] 9/15-9/17/16 for unstable angina. She underwent LHC on 12/29/14 which revealed in stent restenosis in OM1, treated with a DES. EF had normalized by LV gram. She was placed on Effient. BB was not added due to bradycardia.   Today she presents to clinic for follow up.  She has been under a lot of stress lately with her family issues (mother had cancer and son lost his house ). She is up almost 20 lbs since her last September. She is still very SOB. She sometimes gets a little pressure in her chest pain. Rarely she takes a SL NTG and that seems to help. She is still smoking 1/2 pack a day. No LE edema or PND. She has chronic 2 pillow orthopnea. She has been having trouble sleeping. No dizziness or syncope. No blood in her stool or urine. She lost her medicaid and Effient costs >500$. She is working on getting patient assistance.    Of note, this is her first follow up since stent placement in 12/2014. She was not instructed to  follow with [place] on discharge paperwork by IM.   Past Medical History  Diagnosis Date  . HLD (hyperlipidemia) Dx 2013  . Hypertension Dx 2007  . Thyroid disease 1990s  . Anxiety   . Depression   . CAD (coronary artery disease) October 2015    PCI to OM1  . Tobacco abuse     Past Surgical History  Procedure Laterality Date  . Appendectomy    . Tibia fracture surgery Right   . Cesarean section    . Left heart catheterization with coronary angiogram N/A 01/30/2014    Procedure: LEFT HEART CATHETERIZATION WITH CORONARY ANGIOGRAM;  Surgeon: Runell Gess, MD;  Location[facility] CATH LAB;  Service: Cardiovascular;  Laterality: N/A;  . Cardiac catheterization N/A 12/29/2014    Procedure: Left Heart Cath and Coronary Angiography;  Surgeon: Peter M Swaziland, MD;  Location: [facility] INVASIVE CV LAB;  Service: Cardiovascular;  Laterality: N/A;  . Cardiac catheterization N/A 12/29/2014    Procedure: Coronary Stent Intervention;  Surgeon: Peter M Swaziland, MD;  Location: [facility] INVASIVE CV LAB;  Service: Cardiovascular;  Laterality: N/A;     Current Outpatient Prescriptions  Medication Sig Dispense Refill  . albuterol (PROVENTIL HFA;VENTOLIN HFA) 108 (90 BASE) MCG/ACT inhaler Inhale 2 puffs into the lungs every 6 (six) hours as needed for wheezing or shortness of breath. 1 Inhaler 0  .  ALPRAZolam (XANAX) 0.5 MG tablet Take 0.5-1 tablets (0.25-0.5 mg total) by mouth 2 (two) times daily as needed for anxiety. 0.25 mg during the day and 0.5 mg before bed 60 tablet 0  . aspirin 81 MG tablet Take 1 tablet (81 mg total) by mouth daily.    Marland Kitchen atorvastatin (LIPITOR) 40 MG tablet Take 1 tablet (40 mg total) by mouth daily. 30 tablet 5  . furosemide (LASIX) 20 MG tablet Take 1 tablet (20 mg total) by mouth daily. 30 tablet 5  . levothyroxine (SYNTHROID, LEVOTHROID) 125 MCG tablet Take 1 tablet (125 mcg total) by mouth daily before breakfast. 90 tablet 1  . lisinopril (PRINIVIL,ZESTRIL) 10 MG tablet TAKE 1 TABLET BY MOUTH  DAILY 30 tablet 5  . nitroGLYCERIN (NITROSTAT) 0.4 MG SL tablet Place 1 tablet (0.4 mg total) under the tongue every 5 (five) minutes as needed for chest pain. 25 tablet 3  . pantoprazole (PROTONIX) 40 MG tablet Take 1 tablet (40 mg total) by mouth daily. 30 tablet 5  . potassium chloride SA (K-DUR,KLOR-CON) 20 MEQ tablet Take 1 tablet (20 mEq total) by mouth daily. 30 tablet 5  . prasugrel (EFFIENT) 10 MG TABS tablet Take 1 tablet (10 mg total) by mouth daily. 30 tablet 5  . tiotropium (SPIRIVA HANDIHALER) 18 MCG inhalation capsule Place 1 capsule (18 mcg total) into inhaler and inhale daily. 30 capsule 12  . varenicline (CHANTIX PAK) 0.5 MG X 11 & 1 MG X 42 tablet Taper per packet insert 53 tablet 0   No current facility-administered medications for this visit.    Allergies:   Review of patient's allergies indicates no known allergies.    Social History:  The patient  reports that she has been smoking Cigarettes.  She has a 25 pack-year smoking history. She does not have any smokeless tobacco history on file. She reports that she drinks alcohol. She reports that she does not use illicit drugs.   Family History:  The patient's family history includes CAD in her mother; Cancer in her sister; Cancer - Lung in her father and sister; Diabetes type II in her mother and sister; Heart attack in her mother and sister; Heart failure in her mother; Hypertension in her mother, sister, and son.    ROS:  Please see the history of present illness.   Otherwise, review of systems are positive for NONE.   All other systems are reviewed and negative.    PHYSICAL EXAM: VS:  BP 110/80 mmHg  Pulse 88  Ht  (1.727 m)  Wt 203 lb (92.08 kg)  BMI 30.87 kg/m2 , BMI Body mass index is 30.87 kg/(m^2). GEN: Well nourished, well developed, in no acute distress HEENT: normal Neck: no JVD, carotid bruits, or masses Cardiac: RRR; no murmurs, rubs, or gallops,no edema  Respiratory:  clear to auscultation  bilaterally, normal work of breathing GI: soft, nontender, nondistended, + BS MS: no deformity or atrophy Skin: warm and dry, no rash Neuro:  Strength and sensation are intact Psych: euthymic mood, full affect   EKG:  EKG is ordered today. The ekg ordered today demonstrates NSR with low voltage QRS HR 88   Recent Labs: 11/03/2014: ALT 13; Pro B Natriuretic peptide (BNP) 29.0 12/30/2014: BUN 8; Creatinine, Ser 0.80; Hemoglobin 12.1; Platelets 193; Potassium 3.8; Sodium 141 04/10/2015: TSH 27.013*    Lipid Panel    Component Value Date/Time   CHOL 178 04/24/2014 1208   TRIG 181* 04/24/2014 1208   HDL 39* 04/24/2014  1208   CHOLHDL 4.6 04/24/2014 1208   VLDL 36 04/24/2014 1208   LDLCALC 103* 04/24/2014 1208   LDLDIRECT 77 11/03/2014 1251      Wt Readings from Last 3 Encounters:  05/11/15 203 lb (92.08 kg)  04/10/15 205 lb (92.987 kg)  12/30/14 186 lb 11.7 oz (84.7 kg)      Other studies Reviewed: - LHC (01/2014): Ostial LM 20-30%, prox OM1 60%, EF 60% >> IVUS guided PCI: 2.5 x 18 mm Xience Alpine DEX to OM1  - Echo (01/2014): Mild LVH, EF 45-50%, no RWMA, trivial eff  - LHC (12/2014): Ostial LM 25%, ost 1st Mrg to 1st Mrg 80%-s/p DES, normal LV function   ASSESSMENT AND PLAN:  Diana Erickson is a 60 y.o. female with a history of HTN, HLD, CAD s/p PCI/DES to OM1 (01/2014); PCI/DES for ISR of OM1 (12/2014), tobacco abuse, and mild LV dysfunction who presents to clinic for routine follow up.   Coronary artery disease:   - Continue ASA, Effient. OK to switch to plavix if needed- would like to keep her on Effient as long as possible. We have provided a month worth of samples today  - Continue statin.No Beta blocker due to bradycardia perviously. However, HR 78-88 today. WIll add low dose Coreg 3.125 mg BID (this is on the $4 list)   Ischemic Cardiomyopathy/LV dysfunction:EF has now completely normalized by LHC in 12/2014  - Continue ACEI. Will add BB as  above.  HTN: BP 110/80. Continue current regimen. Will add low dose BB as above.  HLD: Continue Simvastatin.Followed by PCP  Tobacco abuse:  Counselled on importance of cessation. still smoking 1/2 ppd    Current medicines are reviewed at length with the patient today.  The patient does not have concerns regarding medicines.  The following changes have been made:  Add coreg 3.125mg  BID  Labs/ tests ordered today include:   Orders Placed This Encounter  Procedures  . EKG 12-Lead     Disposition:   FU with Dr Tenny Craw in 3 months.   Charlestine Massed  05/11/2015 8:22 AM    [facility] [address] [place], [place], [place]  [address] Phone: [phone]; Fax: [phone]

## 2015-05-10 ENCOUNTER — Telehealth: Payer: Self-pay | Admitting: *Deleted

## 2015-05-10 DIAGNOSIS — G894 Chronic pain syndrome: Secondary | ICD-10-CM

## 2015-05-10 MED ORDER — TRAMADOL HCL 50 MG PO TABS
50.0000 mg | ORAL_TABLET | Freq: Two times a day (BID) | ORAL | Status: DC | PRN
Start: 2015-05-10 — End: 2015-05-11

## 2015-05-10 MED FILL — SPIRIVA 18 MCG CP-HANDIHALE: 18 | 30 days supply | Qty: 30 | Fill #0

## 2015-05-10 MED FILL — !EFFIENT 10 MG TABLET: 10 | 28 days supply | Qty: 28 | Fill #1

## 2015-05-10 MED FILL — ?PANTOPRAZOLE SOD DR 40MG: 40 MG | 30 days supply | Qty: 30 | Fill #0

## 2015-05-10 MED FILL — ?LISINOPRIL 10 MG TABLET: 10 | 30 days supply | Qty: 30 | Fill #0 | Status: TO

## 2015-05-10 MED FILL — ?ATORVASTATIN 40MG TABLET: 40 | 30 days supply | Qty: 30 | Fill #1

## 2015-05-10 MED FILL — ?FUROSEMIDE 20 MG TABLET: 20 | 30 days supply | Qty: 30 | Fill #1

## 2015-05-10 NOTE — Telephone Encounter (Signed)
Pt requesting Refill Rx Tramadol refills

## 2015-05-10 NOTE — Telephone Encounter (Signed)
Tramadol refill ready for pickup 

## 2015-05-11 ENCOUNTER — Other Ambulatory Visit: Payer: Self-pay | Admitting: *Deleted

## 2015-05-11 ENCOUNTER — Ambulatory Visit (INDEPENDENT_AMBULATORY_CARE_PROVIDER_SITE_OTHER): Payer: Self-pay | Admitting: Pulmonary Disease

## 2015-05-11 ENCOUNTER — Ambulatory Visit (INDEPENDENT_AMBULATORY_CARE_PROVIDER_SITE_OTHER): Payer: Self-pay | Admitting: Physician Assistant

## 2015-05-11 ENCOUNTER — Encounter: Payer: Self-pay | Admitting: Physician Assistant

## 2015-05-11 ENCOUNTER — Encounter: Payer: Self-pay | Admitting: Pulmonary Disease

## 2015-05-11 ENCOUNTER — Ambulatory Visit
Admission: RE | Admit: 2015-05-11 | Discharge: 2015-05-11 | Disposition: A | Discharge status: Home / Self Care | Payer: No Typology Code available for payment source | Source: Ambulatory Visit | Attending: Family Medicine | Admitting: Family Medicine

## 2015-05-11 ENCOUNTER — Other Ambulatory Visit: Payer: Self-pay | Admitting: Physician Assistant

## 2015-05-11 ENCOUNTER — Telehealth: Payer: Self-pay | Admitting: Family Medicine

## 2015-05-11 VITALS — BP 124/72 | HR 71 | Temp 98.0°F | Wt 203.4 lb

## 2015-05-11 VITALS — BP 110/80 | HR 88 | Ht 68.0 in | Wt 203.0 lb

## 2015-05-11 DIAGNOSIS — Z1231 Encounter for screening mammogram for malignant neoplasm of breast: Secondary | ICD-10-CM

## 2015-05-11 DIAGNOSIS — E663 Overweight: Secondary | ICD-10-CM

## 2015-05-11 DIAGNOSIS — I429 Cardiomyopathy, unspecified: Secondary | ICD-10-CM

## 2015-05-11 DIAGNOSIS — E039 Hypothyroidism, unspecified: Secondary | ICD-10-CM

## 2015-05-11 DIAGNOSIS — R0600 Dyspnea, unspecified: Secondary | ICD-10-CM

## 2015-05-11 DIAGNOSIS — F411 Generalized anxiety disorder: Secondary | ICD-10-CM

## 2015-05-11 DIAGNOSIS — I10 Essential (primary) hypertension: Secondary | ICD-10-CM

## 2015-05-11 DIAGNOSIS — I5022 Chronic systolic (congestive) heart failure: Secondary | ICD-10-CM

## 2015-05-11 DIAGNOSIS — Z720 Tobacco use: Secondary | ICD-10-CM

## 2015-05-11 DIAGNOSIS — I2510 Atherosclerotic heart disease of native coronary artery without angina pectoris: Secondary | ICD-10-CM

## 2015-05-11 DIAGNOSIS — K219 Gastro-esophageal reflux disease without esophagitis: Secondary | ICD-10-CM

## 2015-05-11 DIAGNOSIS — E785 Hyperlipidemia, unspecified: Secondary | ICD-10-CM

## 2015-05-11 MED ORDER — CLONAZEPAM 1 MG PO TABS: 1.0000 mg | ORAL_TABLET | Freq: Two times a day (BID) | ORAL | Status: AC

## 2015-05-11 MED ORDER — ALPRAZOLAM 0.5 MG PO TABS
0.2500 mg | ORAL_TABLET | Freq: Two times a day (BID) | ORAL | Status: AC | PRN
Start: 2015-05-11 — End: ?

## 2015-05-11 MED ORDER — CARVEDILOL 3.125 MG PO TABS: 3.1250 mg | ORAL_TABLET | Freq: Two times a day (BID) | ORAL | Status: AC

## 2015-05-11 MED FILL — clonazePAM 1 MG TABS: 1 | 30 days supply | Qty: 60 | Fill #0 | Status: TO

## 2015-05-11 NOTE — Patient Instructions (Addendum)
Medication Instructions:  Your physician has recommended you make the following change in your medication:  1.  START Coreg 3.125 mb taking 1 tablet twice a day  Labwork: None ordered  Testing/Procedures: None ordered  Follow-Up: Your physician recommends that you schedule a follow-up appointment in:   3 MONTHS WITH DR. ROSS   Any Other Special Instructions Will Be Listed Below (If Applicable).   If you need a refill on your cardiac medications before your next appointment, please call your pharmacy.

## 2015-05-11 NOTE — Telephone Encounter (Signed)
Pt. Came in today requesting a med refill on xanax. Pt. Is out of the medication. Pt stated she comes from [place] and can not come back and get the medication. Please f/u

## 2015-05-11 NOTE — Progress Notes (Signed)
Subjective:     Patient ID: Diana Erickson, female   DOB: [birth year], 60 y.o.   MRN: [id number]  HPI ~  December 15, 2014:  Initial pulmonary consult w/ SN>   11 y/o WF referred by Norma Fredrickson, NP in Cardiology for a pulmonary evaluation> her PCP is Dr. Dessa Phi at [facility]...      Diana Erickson has a hx of CAD- s/p PCI, EF=45-50% by Echo but 60% by Cath, HBP, and HL on ASA81, Effient10, Lisinopril10, Lasix20, and K20;  She recently had a Cards f/u visit- c/o SOB & on going smoking=> referred to [facility] for eval;  She notes SOB all the time, comes and goes, worse in the heat, she does ok w/ ADLs but increase dyspnea w/ walking & activities eg- walking into office from parking lot but it's been the same for ~104yr she says;  She denies much cough, min clear sput, no discoloration or blood;  She notes CP which she describes as a pressure & calls it angina;  She denies swelling on the Lasix & low sodium diet;  When pressed for a better decription of her SOB she admits it's a problem getting the air "IN" and "sometimes when I breath deep the air doesn't help me"      She is a smoker> having started in her teens and smoking 1/2ppd currently; her max was 1.5ppd for several yrs & ave was 1ppd for much of her 40+ smoking hx=> est 50+ pack yrs...      She admits to occas bouts of bronchitis (usually treated w/ antibiotics and resolves);  Denies prev hx pneumonia, Tb or exposure, and no hospitalizations for respiratory issues...      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 EXAM showed Afeb, VSS,  O2sat=100% on RA, wt=201# 5'9"Tall BMI=30;  HEENT- neg, mallampati1;  Chest- clear w/o w/r/r;  Heart- RR w/o m/r/r;  Abd- soft, neg;  Ext- w/o c/c/e;  Neuro- intact...  CXR 11/03/14 showed norm heart size, mild hyperinflation, coarse interstitial markings bilat, scarring right angle, NAD...   Spirometry 12/15/14 showed FVC=3.1 (79%), FEV1=2.3 (77%), %1sec=75 and mid-flows were sl reduced at 69% predicted; this is c/w very mild obstructive dis (small airways dis) & can't r/o superimposed restriction w/o lung vol measure.  Ambulatory oxygen saturation test 12/15/14> O2sat=99% on RA at rest w/ pulse=72/min;  She ambulated 3 laps in the office w/ lowest O2sat=91% w/ pulse=110/min...   LABS 7/16 showed Chems- wnl x K=3.4, BNP=29;  CBC- wnl;  TSH= 2.36...  CT Chest w/o contrast> pending  IMP/PLAN>>  Yemaya has a signif smoking history but actually has little to show for it in the form of airflow obstruction on her spirometry;  In other words she is quite fortunate that she does not have worse obstructive dis at this point & it is imperative that she preserve her lung function by stopping smoking now- we had a long talk about this today, she is already on Wellbutrin &  will add-in Nicotine replacemnent options (patches, gum, lozenges);  He dyspnea is more related to anxiety and 'chest wall factors' w/ 'spasm in the chest wall muscles' preventing her from getting a good deep breath- in this regard I would prefer her to be on a regular dose of KLONOPIN Bid and she can still use her Alprazolam 0.25mg  prn for anxiety (NOTE- she was prev on Valium10Tid while in [place] before moving here in 2015)... She will follow up w/ me in 4-6wks, we gave her the 2016 Flu vaccine today.   ~  May 11, 2015:  86mo ROV w/ SN>  Diana Erickson returns indicating that she liked the Klonopin but hasn't been taking it regularly as prescribed; she is still smoking & c/o SOB "can't get the air "IN" as before; her prev Spirometry showed only  small airways dis & she was encouraged to quit smoking & had Wellburtrin, Nicorette, & give Rx for Chantix (never filled this)... She has had numerous f/u visits during the interval reviewed in Epic>    She saw her PCP [facility]Funches at [facility] 04/10/15>  HBP, SOB, smoking 1/2 ppd, medical problems all reviewed- they refilled her Xanax, gave her a trial of Spiriva & Chantix    CARDS 05/11/15> HBP, HL, CAD-s/p PCIs in 2015 & 12/2014, mild LVDysfunction; pt under a lot of stress, wt gain, still smoking; no change in med regimen...    Dyspnea is likely multifactorial w/ anxiety, chest wall factors, lack of exercise & deconditioning playing a roll; Rx Klonopin  Bid regularly & she has AlbutHFA for prn use...    Cigarette smoker> we reviewed smoking cessation options; recall that Father & Sis had lung cancer...    Min obstructive lung dis from smoking> she has AlbutHFA to use prn    HBP> controlled on Lisin10 & Lasix20    CAD- s/p PCI to OM1> on ASA81 & Effient10    HL> on Lip40    Anxiety & Depression> on Alpraz0.25, Wellbutrin150Bid, & Lexapro20    Mult medical problems including above + Hypothy (on Synthroid112), and GERD (on Protonix40). EXAM showed Afeb, VSS, O2sat=97% on RA, wt=203# 5'9"Tall BMI=30;  HEENT- neg, mallampati1;  Chest- scat rhonchi w/o wheezing/ rales;  Heart- RR w/o m/r/r;  Abd- soft, neg;  Ext- w/o c/c/e;  Neuro- intact...  Last CXR 12/28/14>  Hyperinflation w/ interstitial thickening, NAD...  IMP/PLAN>>  Diana Erickson needs to quit smoking! We discussed this again; Refer to the Low-dose screening CT Chest program if she is interested;  Rx dyspnea w/ KLONOPIN  Bid regularly;  I doubt the Spiriva is adding much given her relatively well preserved pulm function- NEEDS TO QUIT THE SMOKING...     Past Medical History  Diagnosis Date  . HLD (hyperlipidemia) >> on Lipitor40 Dx 2013  . Hypertension >> on Lisinopril10, Lasix20, K20 Dx 2007  . Thyroid disease >.on Synthroid112  1990s  . Anxiety >> on Alprazolam 0.25mg  Tid prn & WellbutrinSR 150Bid   . Depression >> on Lexapro20    GERD >> on Protonix40   . CAD (coronary artery disease) >> on ASA81, Effient10 October 2015    PCI to OM1  . Tobacco abuse => she is trying the nicotine replacement options     Past Surgical History  Procedure Laterality Date  . Appendectomy    . Tibia fracture surgery Right   . Cesarean section    . Left heart catheterization with coronary angiogram N/A 01/30/2014    Procedure: LEFT HEART CATHETERIZATION WITH  CORONARY ANGIOGRAM;  Surgeon: Runell Gess, MD;  Location: [facility] CATH LAB;  Service: Cardiovascular;  Laterality: N/A;  . Cardiac catheterization N/A 12/29/2014    Procedure: Left Heart Cath and Coronary Angiography;  Surgeon: Peter M Swaziland, MD;  Location: [facility] INVASIVE CV LAB;  Service: Cardiovascular;  Laterality: N/A;  . Cardiac catheterization N/A 12/29/2014    Procedure: Coronary Stent Intervention;  Surgeon: Peter M Swaziland, MD;  Location: [facility] INVASIVE CV LAB;  Service: Cardiovascular;  Laterality: N/A;    Outpatient Encounter Prescriptions as of 05/11/2015  Medication Sig  . albuterol (PROVENTIL HFA;VENTOLIN HFA) 108 (90 BASE) MCG/ACT inhaler Inhale 2 puffs into the lungs every 6 (six) hours as needed for wheezing or shortness of breath.  Marland Kitchen aspirin 81 MG tablet Take 1 tablet (81 mg total) by mouth daily.  Marland Kitchen atorvastatin (LIPITOR) 40 MG tablet Take 1 tablet (40 mg total) by mouth daily.  . carvedilol (COREG) 3.125 MG tablet Take 1 tablet (3.125 mg total) by mouth 2 (two) times daily.  . furosemide (LASIX) 20 MG tablet Take 1 tablet (20 mg total) by mouth daily.  Marland Kitchen levothyroxine (SYNTHROID, LEVOTHROID) 125 MCG tablet Take 1 tablet (125 mcg total) by mouth daily before breakfast.  . lisinopril (PRINIVIL,ZESTRIL) 10 MG tablet TAKE 1 TABLET BY MOUTH DAILY  . nitroGLYCERIN (NITROSTAT) 0.4 MG SL tablet Place 1 tablet (0.4 mg total) under the tongue every 5 (five) minutes as needed  for chest pain.  . pantoprazole (PROTONIX) 40 MG tablet Take 1 tablet (40 mg total) by mouth daily.  . potassium chloride SA (K-DUR,KLOR-CON) 20 MEQ tablet Take 1 tablet (20 mEq total) by mouth daily.  . prasugrel (EFFIENT) 10 MG TABS tablet Take 1 tablet (10 mg total) by mouth daily.  Marland Kitchen tiotropium (SPIRIVA HANDIHALER) 18 MCG inhalation capsule Place 1 capsule (18 mcg total) into inhaler and inhale daily.  . [DISCONTINUED] ALPRAZolam (XANAX) 0.5 MG tablet Take 0.5-1 tablets (0.25-0.5 mg total) by mouth 2 (two) times daily as needed for anxiety. 0.25 mg during the day and 0.5 mg before bed  . clonazePAM (KLONOPIN) 1 MG tablet Take 1 tablet (1 mg total) by mouth 2 (two) times daily.  . [DISCONTINUED] varenicline (CHANTIX PAK) 0.5 MG X 11 & 1 MG X 42 tablet Taper per packet insert (Patient not taking: Reported on 05/11/2015)   No facility-administered encounter medications on file as of 05/11/2015.    No Known Allergies   Immunization History  Administered Date(s) Administered  . Influenza,inj,Quad PF,36+ Mos 01/30/2014, 12/15/2014  . Pneumococcal Polysaccharide-23 01/30/2014    Current Medications, Allergies, Past Medical History, Past Surgical History, Family History, and Social History were reviewed in [employer] record.   Review of Systems            All symptoms NEG except where BOLDED >>  Constitutional:  F/C/S, fatigue, anorexia, unexpected weight change. HEENT:  HA, visual changes, hearing loss, earache, nasal symptoms, sore throat, mouth sores, hoarseness. Resp:  cough, sputum, hemoptysis; SOB, tightness, wheezing. Cardio:  CP, palpit, DOE, orthopnea, edema. GI:  N/V/D/C, blood in stool; reflux, abd pain, distention, gas. GU:  dysuria, freq, urgency, hematuria, flank pain, voiding difficulty. MS:  joint pain, swelling, tenderness, decr ROM; neck pain, back pain, etc. Neuro:  HA, tremors, seizures, dizziness, syncope, weakness, numbness, gait abn. Skin:   suspicious lesions or skin rash. Heme:  adenopathy, bruising, bleeding. Psyche:  confusion, agitation, sleep disturbance, hallucinations, anxiety, depression suicidal.   Objective:   Physical Exam  Vital Signs:  Reviewed...   General:  WD, overweight, 60 y/o WF in NAD; alert & oriented; pleasant & cooperative... HEENT:  [place]/AT; Conjunctiva- pink, Sclera- nonicteric, EOM-wnl, PERRLA, EACs-clear, TMs-wnl; NOSE-clear; THROAT-clear & wnl.  Neck:  Supple w/ fair ROM; no JVD; normal carotid impulses w/o bruits; no thyromegaly or nodules palpated; no lymphadenopathy.  Chest:  Clear to P & A; without wheezes, rales, or rhonchi heard. Heart:  Regular Rhythm; norm S1 & S2 without murmurs, rubs, or gallops detected. Abdomen:  Soft & nontender- no guarding or rebound; normal bowel sounds; no organomegaly or masses palpated. Ext:  Normal ROM; without deformities or arthritic changes; no varicose veins, venous insuffic, or edema;  Pulses intact w/o bruits. Neuro:  No focal neuro deficits; sensory testing normal; gait normal & balance OK. Derm:  No lesions noted; no rash etc. Lymph:  No cervical, supraclavicular, axillary, or inguinal adenopathy palpated.   Assessment:      IMP >>     Dyspnea is likely multifactorial w/ anxiety, chest wall factors, lack of exercise & deconditioning playing a roll; Rx Klonopin  Bid regularly & she has AlbutHFA for prn use...    Cigarette smoker> we reviewed smoking cessation options; recall that Father & Sis had lung cancer...    Min obstructive lung dis from smoking> she has AlbutHFA to use prn    HBP> controlled on Lisin10 & Lasix20    CAD- s/p PCI to OM1> on ASA81 & Effient10    HL> on Lip40    Anxiety & Depression> on Alpraz0.25, Wellbutrin150Bid, & Lexapro20    Mult medical problems including above + Hypothy (on Synthroid112), and GERD (on Protonix40).  PLAN >>  12/15/14>   Diana Erickson has a signif smoking history but actually has little to show for it in  the form of airflow obstruction on her spirometry;  In other words she is quite fortunate that she does not have worse obstructive dis at this point & it is imperative that she preserve her lung function by stopping smoking now- we had a long talk about this today, she is already on Wellbutrin & will add-in Nicotine replacemnent options (patches, gum, lozenges);  He dyspnea is more related to anxiety and 'chest wall factors' w/ 'spasm in the chest wall muscles' preventing her from getting a good deep breath- in this regard I would prefer her to be on a regular dose of KLONOPIN Bid and she can still use her Alprazolam 0.25mg  prn for anxiety (NOTE- she was prev on Valium10Tid while in [place] before moving here in 2015)... She will follow up w/ me in 4-6wks, we gave her the 2016 Flu vaccine today. 05/11/15>   Diana Erickson needs to quit smoking! We discussed this again; Refer to the Low-dose screening CT Chest program if she is interested;  Rx dyspnea w/ KLONOPIN  Bid regularly;  I doubt the Spiriva is adding much given her relatively well preserved pulm function- NEEDS TO QUIT THE SMOKING...      Plan:     Patient's Medications  New Prescriptions   CLONAZEPAM (KLONOPIN) 1 MG TABLET    Take 1 tablet (1 mg total) by mouth 2 (two) times daily.  Previous Medications   ALBUTEROL (PROVENTIL HFA;VENTOLIN HFA) 108 (90 BASE) MCG/ACT INHALER    Inhale 2 puffs into the lungs every 6 (six) hours as needed for wheezing or shortness of breath.   ASPIRIN 81 MG TABLET    Take 1 tablet (81 mg total) by mouth daily.   ATORVASTATIN (  LIPITOR) 40 MG TABLET    Take 1 tablet (40 mg total) by mouth daily.   CARVEDILOL (COREG) 3.125 MG TABLET    Take 1 tablet (3.125 mg total) by mouth 2 (two) times daily.   FUROSEMIDE (LASIX) 20 MG TABLET    Take 1 tablet (20 mg total) by mouth daily.   LEVOTHYROXINE (SYNTHROID, LEVOTHROID) 125 MCG TABLET    Take 1 tablet (125 mcg total) by mouth daily before breakfast.   LISINOPRIL  (PRINIVIL,ZESTRIL) 10 MG TABLET    TAKE 1 TABLET BY MOUTH DAILY   NITROGLYCERIN (NITROSTAT) 0.4 MG SL TABLET    Place 1 tablet (0.4 mg total) under the tongue every 5 (five) minutes as needed for chest pain.   PANTOPRAZOLE (PROTONIX) 40 MG TABLET    Take 1 tablet (40 mg total) by mouth daily.   POTASSIUM CHLORIDE SA (K-DUR,KLOR-CON) 20 MEQ TABLET    Take 1 tablet (20 mEq total) by mouth daily.   PRASUGREL (EFFIENT) 10 MG TABS TABLET    Take 1 tablet (10 mg total) by mouth daily.   TIOTROPIUM (SPIRIVA HANDIHALER) 18 MCG INHALATION CAPSULE    Place 1 capsule (18 mcg total) into inhaler and inhale daily.  Modified Medications   Modified Medication Previous Medication   ALPRAZOLAM (XANAX) 0.5 MG TABLET ALPRAZolam (XANAX) 0.5 MG tablet      Take 0.5-1 tablets (0.25-0.5 mg total) by mouth 2 (two) times daily as needed for anxiety. 0.25 mg during the day and 0.5 mg before bed    Take 0.5-1 tablets (0.25-0.5 mg total) by mouth 2 (two) times daily as needed for anxiety. 0.25 mg during the day and 0.5 mg before bed  Discontinued Medications   VARENICLINE (CHANTIX PAK) 0.5 MG X 11 & 1 MG X 42 TABLET    Taper per packet insert

## 2015-05-11 NOTE — Telephone Encounter (Signed)
Rx refilled. Rx given to pt

## 2015-05-11 NOTE — Telephone Encounter (Signed)
Rx faxed to [place] [facility]  LVM to pt Rx faxed

## 2015-05-11 NOTE — Patient Instructions (Signed)
Today we updated your med list in our EPIC system...    Continue your current medications the same...  I am pleased that the [facility]  twice daily has helped your sensation of shortness of breath...'    It would be reasonable to continue this med going forward & use this instead of the Alprazolam...  You must quit all smoking!!!  Call for any questions...  Let's plan a follow up visit in 57mo, sooner if needed for problems.Marland KitchenMarland Kitchen

## 2015-05-14 ENCOUNTER — Other Ambulatory Visit: Payer: Self-pay | Admitting: *Deleted

## 2015-05-14 DIAGNOSIS — I2510 Atherosclerotic heart disease of native coronary artery without angina pectoris: Secondary | ICD-10-CM

## 2015-05-14 DIAGNOSIS — Z720 Tobacco use: Secondary | ICD-10-CM

## 2015-05-14 DIAGNOSIS — J449 Chronic obstructive pulmonary disease, unspecified: Secondary | ICD-10-CM

## 2015-05-14 MED ORDER — VARENICLINE TARTRATE 0.5 MG X 11 & 1 MG X 42 PO MISC: ORAL | Status: AC

## 2015-05-14 MED ORDER — PRASUGREL HCL 10 MG PO TABS: 10.0000 mg | ORAL_TABLET | Freq: Every day | ORAL | Status: AC

## 2015-05-14 MED ORDER — ALBUTEROL SULFATE HFA 108 (90 BASE) MCG/ACT IN AERS: 2.0000 | INHALATION_SPRAY | Freq: Four times a day (QID) | RESPIRATORY_TRACT | Status: DC | PRN

## 2015-05-14 MED ORDER — TIOTROPIUM BROMIDE MONOHYDRATE 18 MCG IN CAPS: 18.0000 ug | ORAL_CAPSULE | Freq: Every day | RESPIRATORY_TRACT | Status: AC

## 2015-05-14 MED ORDER — VARENICLINE TARTRATE 1 MG PO TABS: 1.0000 mg | ORAL_TABLET | Freq: Two times a day (BID) | ORAL | Status: AC

## 2015-08-06 MED FILL — ?ATORVASTATIN 40MG TABLET: 40 | 30 days supply | Qty: 30 | Fill #2 | Status: TO

## 2015-08-06 MED FILL — ?FUROSEMIDE 20 MG TABLET: 20 | 30 days supply | Qty: 30 | Fill #2 | Status: TO

## 2015-08-06 MED FILL — ?PANTOPRAZOLE SOD DR 40MG: 40 MG | 30 days supply | Qty: 30 | Fill #1 | Status: TO

## 2015-08-06 MED FILL — POTASSIUM CL ER 20 MEQ TAB: 20 | 30 days supply | Qty: 30 | Fill #1 | Status: TO

## 2015-08-06 MED FILL — !EFFIENT 10 MG TABLET: 10 | 28 days supply | Qty: 28 | Fill #2 | Status: TO

## 2015-08-06 MED FILL — SPIRIVA 18 MCG CP-HANDIHALE: 18 | 30 days supply | Qty: 30 | Fill #1 | Status: TO

## 2015-08-10 ENCOUNTER — Ambulatory Visit: Payer: Self-pay | Admitting: Internal Medicine

## 2015-08-14 ENCOUNTER — Encounter: Payer: Self-pay | Admitting: Internal Medicine

## 2015-11-08 ENCOUNTER — Ambulatory Visit: Payer: No Typology Code available for payment source | Admitting: Pulmonary Disease

## 2015-11-23 ENCOUNTER — Telehealth: Payer: Self-pay | Admitting: Cardiology

## 2015-11-23 NOTE — Telephone Encounter (Signed)
[facility] placed in Chart prep room appointment needs to be made.

## 2015-11-29 ENCOUNTER — Other Ambulatory Visit: Payer: Self-pay

## 2015-11-29 DIAGNOSIS — J449 Chronic obstructive pulmonary disease, unspecified: Secondary | ICD-10-CM

## 2015-11-29 MED ORDER — ALBUTEROL SULFATE HFA 108 (90 BASE) MCG/ACT IN AERS: 2.0000 | INHALATION_SPRAY | Freq: Four times a day (QID) | RESPIRATORY_TRACT | 3 refills | Status: AC | PRN

## 2016-03-30 IMAGING — CR DG CHEST 1V PORT
1 series · 1 of 1 positions shown · non-contrast
Comparison: None.

CLINICAL DATA: Mid sternal chest pain radiating to the left chest,
left arm, mandible and neck. Shortness of breath. Smoker.

EXAM:
PORTABLE CHEST - 1 VIEW

[AP]
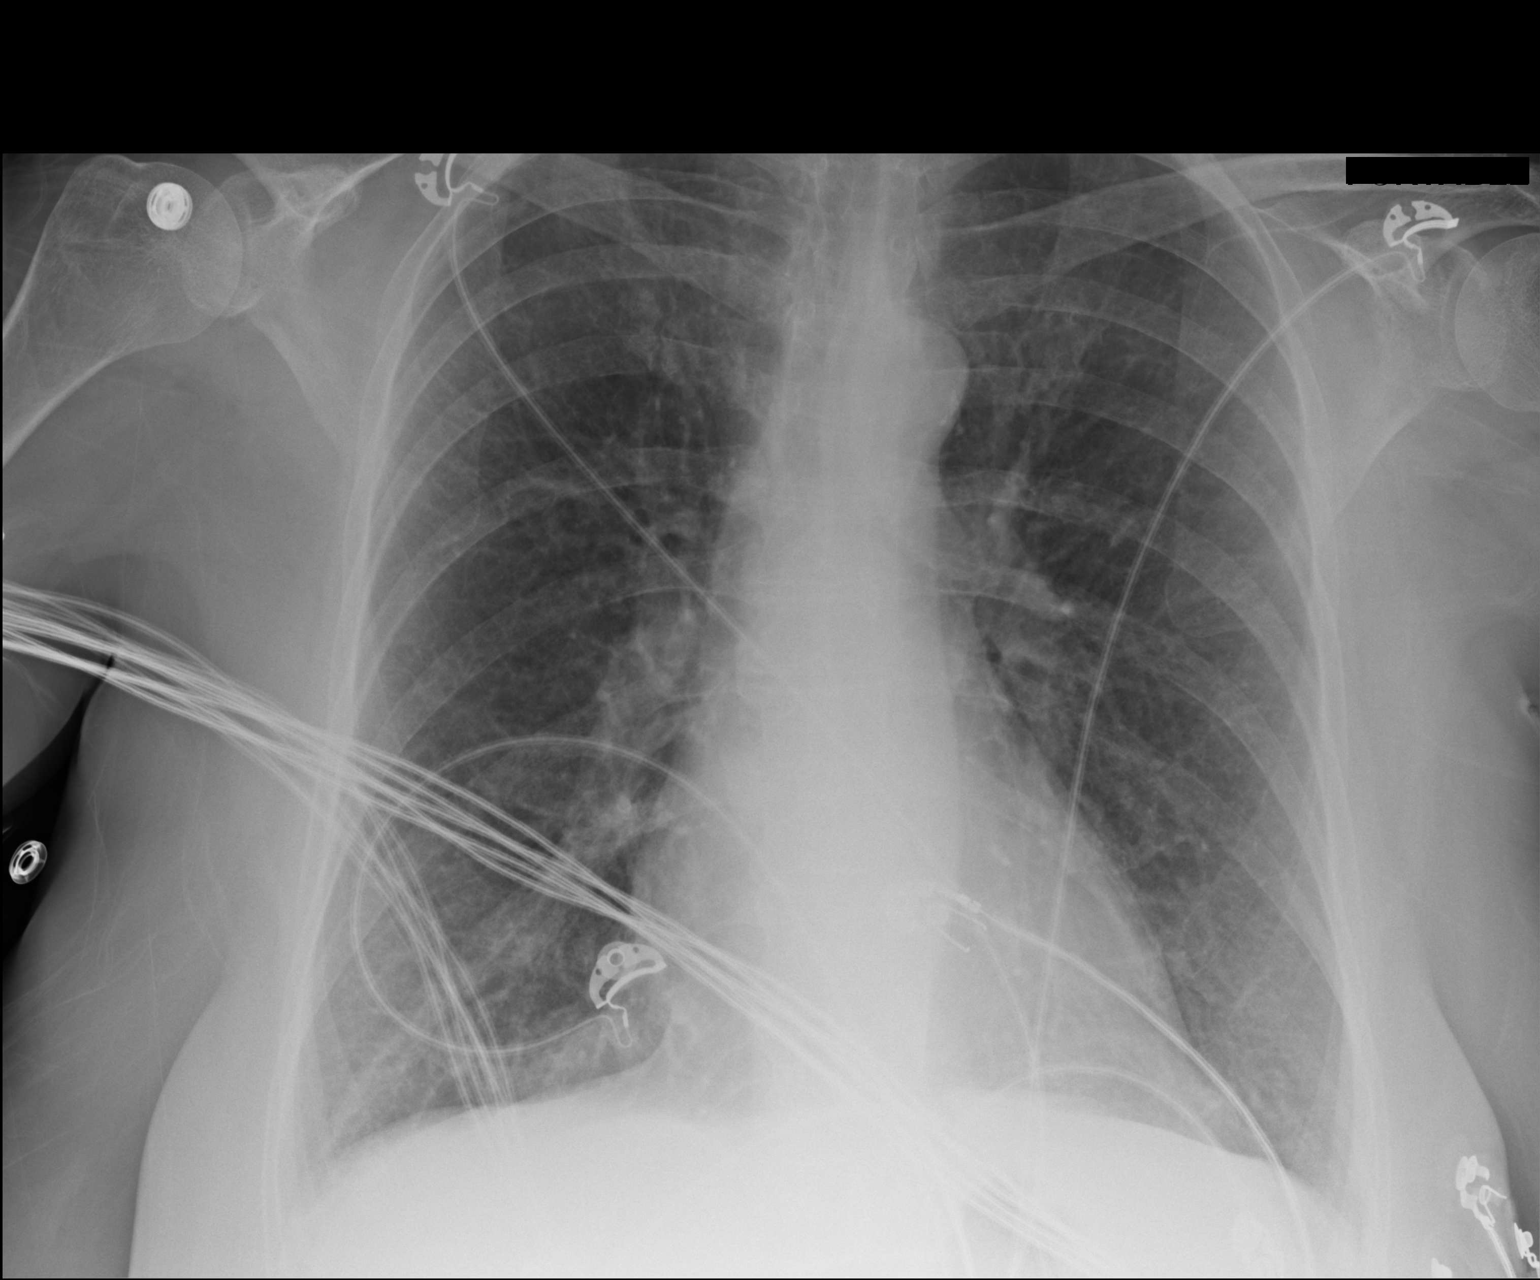

[1 of 1 positions shown; findings below may reference images not displayed]

FINDINGS: Borderline enlarged cardiac silhouette. Clear lungs. The lungs are
hyperexpanded with prominent interstitial markings and mildly
prominent pulmonary vasculature. Unremarkable bones.
IMPRESSION: Changes of COPD with borderline cardiomegaly, mild pulmonary
vascular congestion and probable mild interstitial pulmonary edema.

## 2016-08-03 DIAGNOSIS — S5001XA Contusion of right elbow, initial encounter: Secondary | ICD-10-CM | POA: Insufficient documentation

## 2016-08-03 DIAGNOSIS — S20219A Contusion of unspecified front wall of thorax, initial encounter: Secondary | ICD-10-CM | POA: Insufficient documentation

## 2016-08-03 DIAGNOSIS — S42209A Unspecified fracture of upper end of unspecified humerus, initial encounter for closed fracture: Secondary | ICD-10-CM | POA: Insufficient documentation

## 2017-01-03 IMAGING — CR DG CHEST 2V
2 series · 2 of 2 positions shown · non-contrast
Comparison: Portable chest x-ray January 28, 2014

CLINICAL DATA: Two weeks of shortness of breath, history of COPD
and current tobacco use, Coronary artery disease.

EXAM:
CHEST  2 VIEW

[w chest pa]
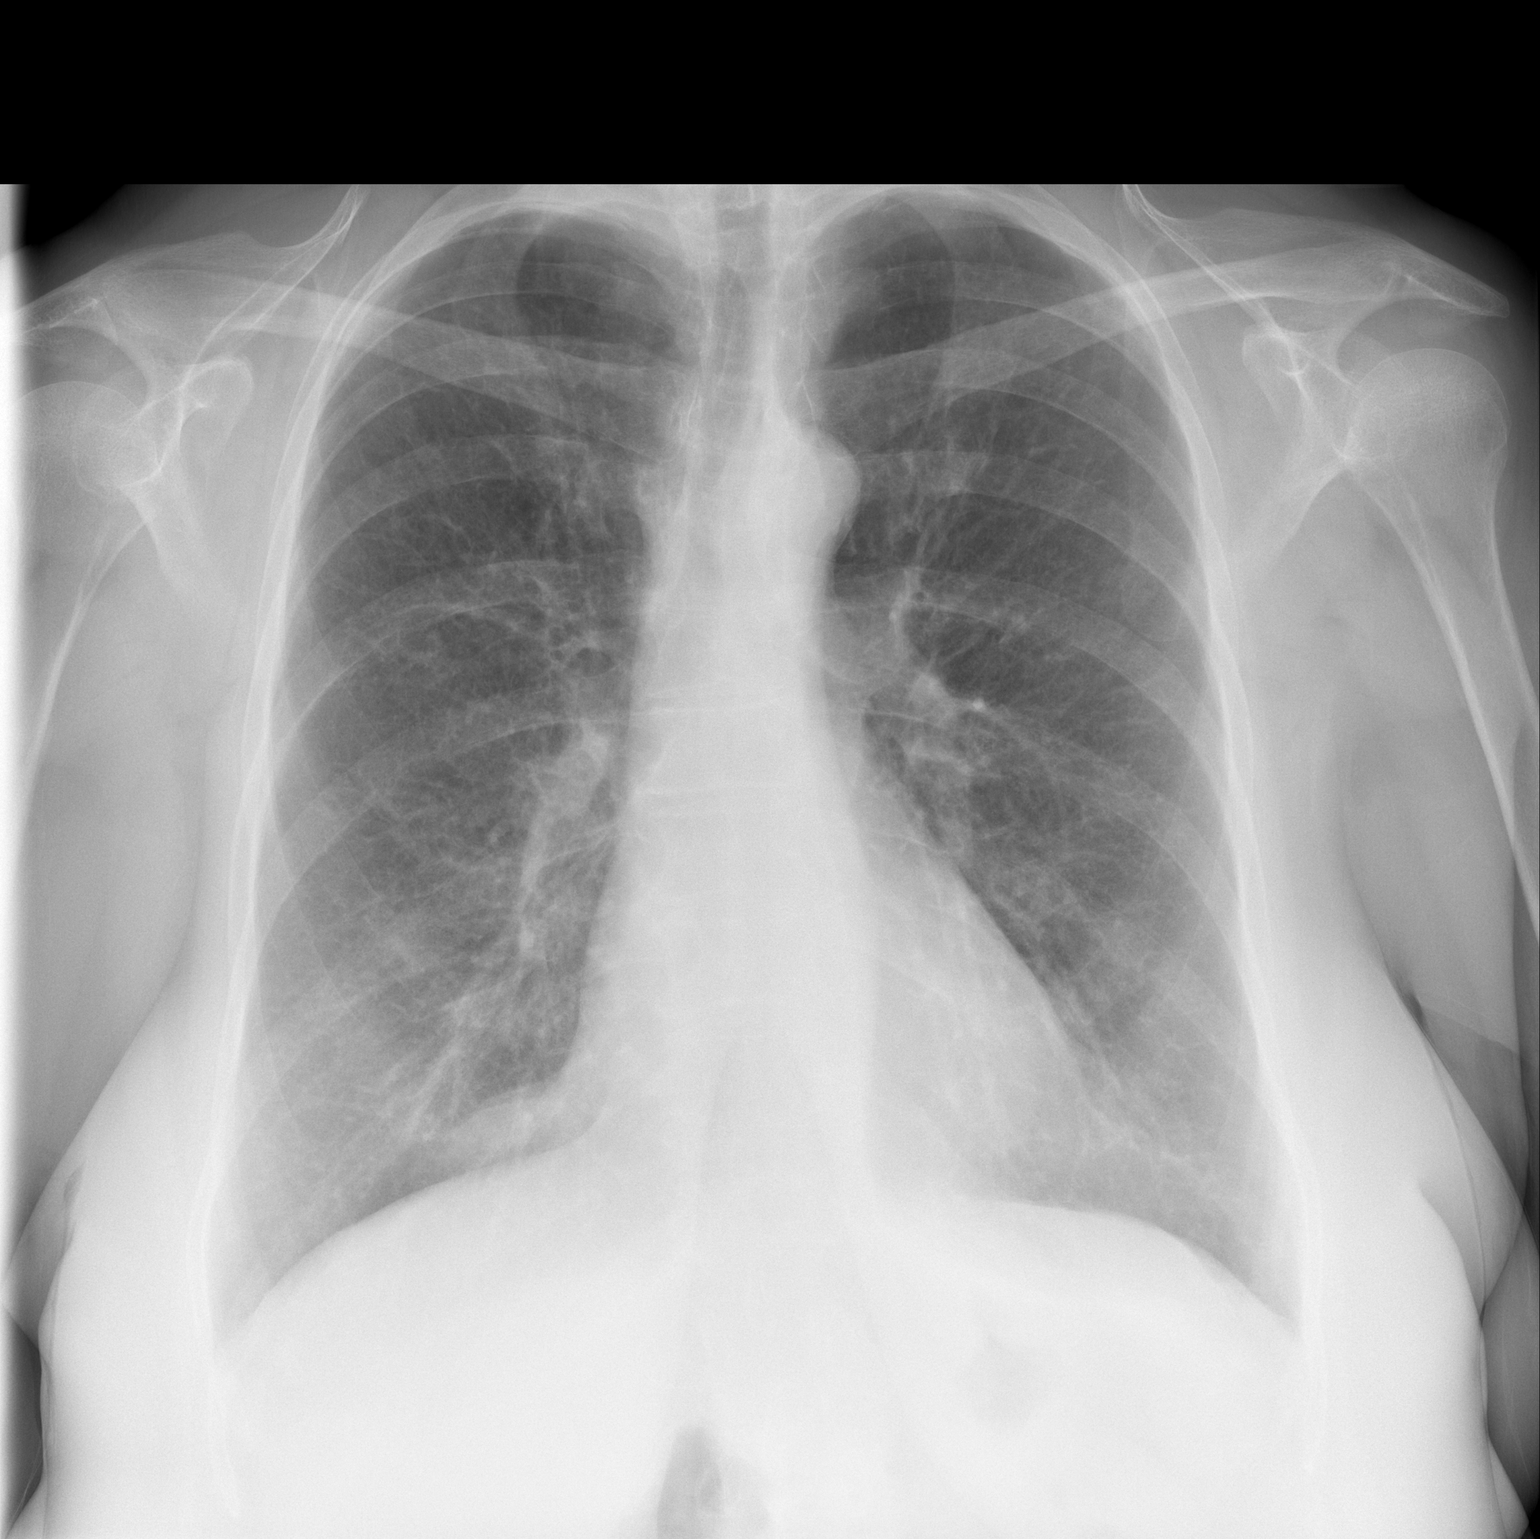

[w chest lat]
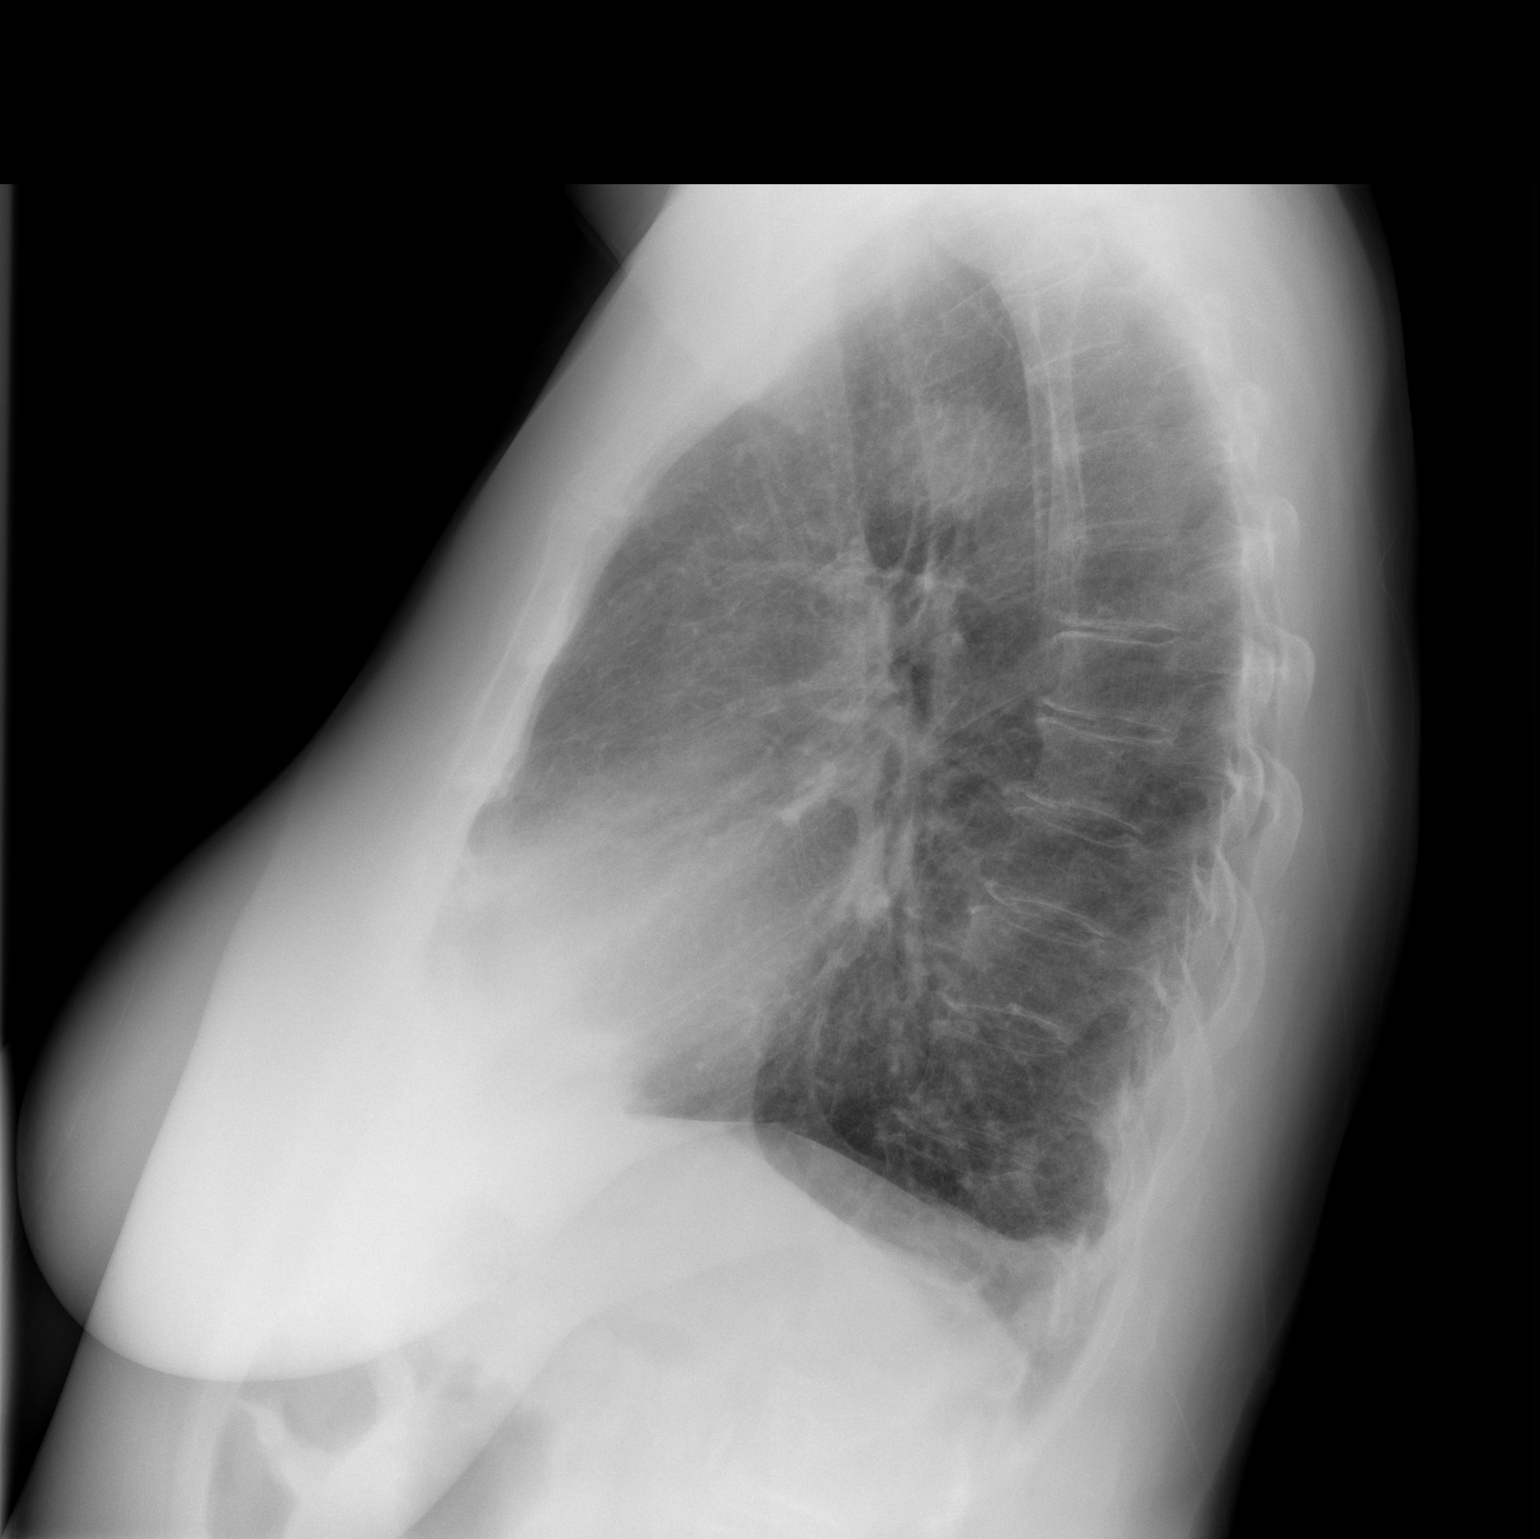

[2 of 2 positions shown; findings below may reference images not displayed]

FINDINGS: The lungs are mildly hyperinflated. The interstitial markings are
coarse bilaterally. The heart normal in size. The pulmonary
vascularity is not engorged. There is mild blunting of the posterior
costophrenic angle on the right. The bony thorax exhibits no acute
abnormality.
IMPRESSION: COPD. Trace of pleural fluid versus pleural thickening in the
posterior costophrenic angle on the right. Chronically increased
interstitial markings likely reflect the patient's smoking history.
No acute pneumonia nor evidence of CHF.

Follow-up chest CT scanning is recommended if patient's symptoms
persist and remain unexplained.

## 2017-02-27 IMAGING — CR DG CHEST 1V PORT
1 series · 1 of 1 positions shown · non-contrast
Comparison: Frontal and lateral views 11/03/2014

CLINICAL DATA: Chest pain for 1 day.

EXAM:
PORTABLE CHEST - 1 VIEW

[AP]
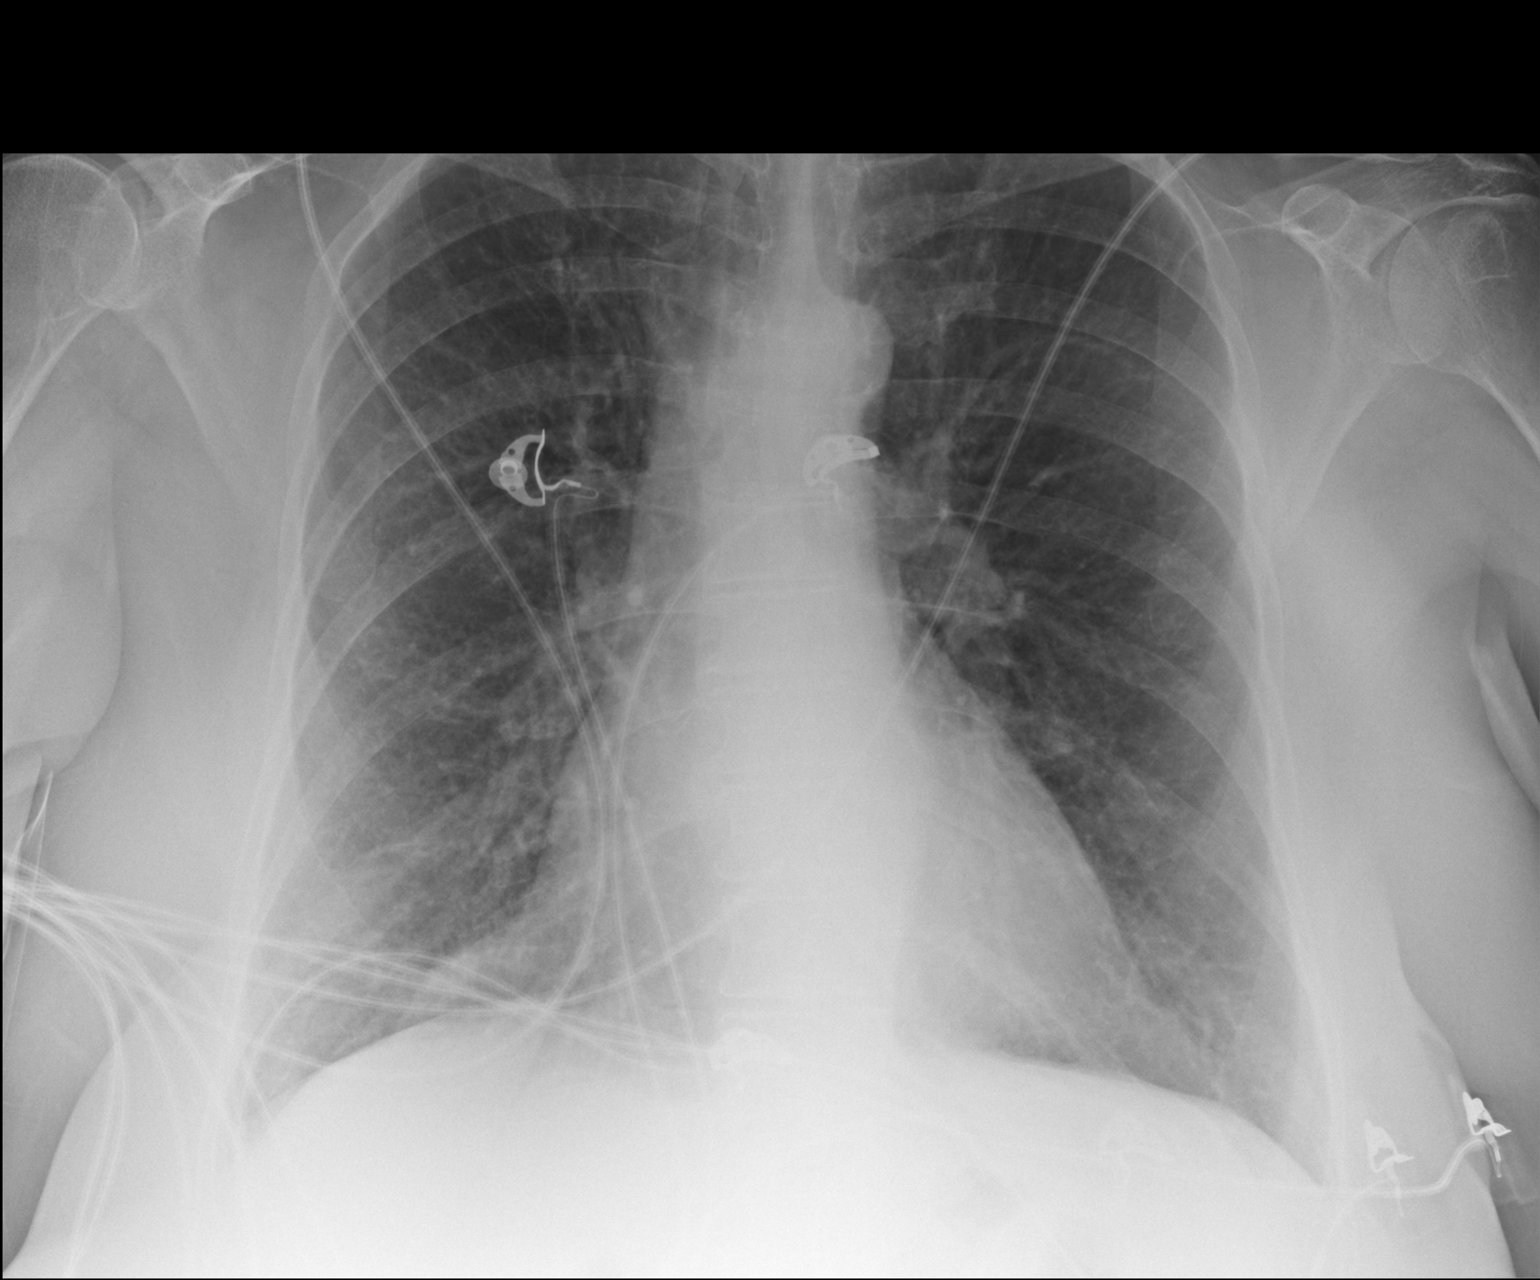

[1 of 1 positions shown; findings below may reference images not displayed]

FINDINGS: The lungs remain hyperinflated with interstitial thickening. The
cardiomediastinal contours are unchanged. Pulmonary vasculature is
normal. There is trace blunting of the right costophrenic angle that
is unchanged. No consolidation or pneumothorax. No acute osseous
abnormalities are seen.
IMPRESSION: No acute pulmonary process.  Unchanged hyperinflation.

## 2017-10-29 ENCOUNTER — Inpatient Hospital Stay: Admit: 2017-10-29 | Payer: BLUE CROSS/BLUE SHIELD | Primary: Internal Medicine

## 2017-10-29 DIAGNOSIS — Z01818 Encounter for other preprocedural examination: Secondary | ICD-10-CM

## 2017-10-29 LAB — EKG, 12 LEAD, INITIAL
Atrial Rate: 75 {beats}/min
Calculated P Axis: 51 degrees
Calculated R Axis: 11 degrees
Calculated T Axis: 28 degrees
Diagnosis: NORMAL
P-R Interval: 172 ms
Q-T Interval: 414 ms
QRS Duration: 98 ms
QTC Calculation (Bezet): 462 ms
Ventricular Rate: 75 {beats}/min

## 2017-10-29 LAB — METABOLIC PANEL, COMPREHENSIVE
A-G Ratio: 1.1 (ref 0.8–1.7)
ALT (SGPT): 24 U/L (ref 13–56)
AST (SGOT): 21 U/L (ref 10–38)
Albumin: 3.8 g/dL (ref 3.4–5.0)
Alk. phosphatase: 80 U/L (ref 45–117)
Anion gap: 6 mmol/L (ref 3.0–18)
BUN/Creatinine ratio: 29 — ABNORMAL HIGH (ref 12–20)
BUN: 25 MG/DL — ABNORMAL HIGH (ref 7.0–18)
Bilirubin, total: 0.3 MG/DL (ref 0.2–1.0)
CO2: 30 mmol/L (ref 21–32)
Calcium: 9 MG/DL (ref 8.5–10.1)
Chloride: 107 mmol/L (ref 100–111)
Creatinine: 0.86 MG/DL (ref 0.6–1.3)
GFR est AA: >60 mL/min/{1.73_m2} (ref >60)
GFR est non-AA: >60 mL/min/{1.73_m2} (ref >60)
Globulin: 3.4 g/dL (ref 2.0–4.0)
Glucose: 83 mg/dL (ref 74–99)
Potassium: 4.2 mmol/L (ref 3.5–5.5)
Protein, total: 7.2 g/dL (ref 6.4–8.2)
Sodium: 143 mmol/L (ref 136–145)

## 2017-10-29 LAB — CBC W/O DIFF
HCT: 38.8 % (ref 35.0–45.0)
HGB: 12.5 g/dL (ref 12.0–16.0)
MCH: 30 PG (ref 24.0–34.0)
MCHC: 32.2 g/dL (ref 31.0–37.0)
MCV: 93 FL (ref 74.0–97.0)
MPV: 11.5 FL (ref 9.2–11.8)
PLATELET: 239 10*3/uL (ref 135–420)
RBC: 4.17 M/uL — ABNORMAL LOW (ref 4.20–5.30)
RDW: 13.2 % (ref 11.6–14.5)
WBC: 4.5 10*3/uL — ABNORMAL LOW (ref 4.6–13.2)

## 2017-10-29 LAB — EKG 12-LEAD
Atrial Rate: 75 {beats}/min
Diagnosis: NORMAL
P Axis: 51 degrees
P-R Interval: 172 ms
Q-T Interval: 414 ms
QRS Duration: 98 ms
QTc Calculation (Bazett): 462 ms
R Axis: 11 degrees
T Axis: 28 degrees
Ventricular Rate: 75 {beats}/min

## 2017-10-29 LAB — COMPREHENSIVE METABOLIC PANEL
ALT: 24 U/L (ref 13–56)
AST: 21 U/L (ref 10–38)
Albumin/Globulin Ratio: 1.1 (ref 0.8–1.7)
Albumin: 3.8 g/dL (ref 3.4–5.0)
Alkaline Phosphatase: 80 U/L (ref 45–117)
Anion Gap: 6 mmol/L (ref 3.0–18)
BUN: 25 MG/DL — ABNORMAL HIGH (ref 7.0–18)
Bun/Cre Ratio: 29 — ABNORMAL HIGH (ref 12–20)
CO2: 30 mmol/L (ref 21–32)
Calcium: 9 MG/DL (ref 8.5–10.1)
Chloride: 107 mmol/L (ref 100–111)
Creatinine: 0.86 MG/DL (ref 0.6–1.3)
EGFR IF NonAfrican American: >60 mL/min/{1.73_m2} (ref >60)
GFR African American: >60 mL/min/{1.73_m2} (ref >60)
Globulin: 3.4 g/dL (ref 2.0–4.0)
Glucose: 83 mg/dL (ref 74–99)
Potassium: 4.2 mmol/L (ref 3.5–5.5)
Sodium: 143 mmol/L (ref 136–145)
Total Bilirubin: 0.3 MG/DL (ref 0.2–1.0)
Total Protein: 7.2 g/dL (ref 6.4–8.2)

## 2017-10-29 LAB — CBC
Hematocrit: 38.8 % (ref 35.0–45.0)
Hemoglobin: 12.5 g/dL (ref 12.0–16.0)
MCH: 30 PG (ref 24.0–34.0)
MCHC: 32.2 g/dL (ref 31.0–37.0)
MCV: 93 FL (ref 74.0–97.0)
MPV: 11.5 FL (ref 9.2–11.8)
Platelets: 239 10*3/uL (ref 135–420)
RBC: 4.17 M/uL — ABNORMAL LOW (ref 4.20–5.30)
RDW: 13.2 % (ref 11.6–14.5)
WBC: 4.5 10*3/uL — ABNORMAL LOW (ref 4.6–13.2)

## 2017-10-29 NOTE — Other (Signed)
At the time of the PAT interview, patient did not meet special population criteria.  Operative consent filled out according to MD order on surgical posting, verbatim.

## 2017-10-29 NOTE — Other (Signed)
Pt denies sleep apnea.  Dr Ridley did a heart cath on me, and everything was clear"

## 2017-10-29 NOTE — Interval H&P Note (Signed)
At the time of the PAT interview, patient did not meet special population criteria.  Operative consent filled out according to MD order on surgical posting, verbatim.

## 2017-10-29 NOTE — Interval H&P Note (Signed)
 Pt denies sleep apnea.  Dr Everlene did a heart cath on me, and everything was clear

## 2017-11-10 NOTE — H&P (Signed)
History and Physical        Patient: Cassandra Shah               Sex: female          DOA: (Not on file)         Date of Birth:  1955-08-11      Age:  62 y.o.        LOS:  LOS: 0 days        HPI:     Rosann Auerbachrish is in for followup of her right knee posterior horn medial meniscal tear/mild patellofemoral DJD. The patient is here today for evaluation and treatment.  She has seen Dr. Effie Shyoleman in the past, who is out of the office at the present.  She has had this pain for many years. It has gotten worse her recently. The pain is worse on the medial side and this is her main complaint. She does notice her patellofemoral complaints with arthritis, but this is not as big a concern. [place] and then bring her back in spring.  Chip BoerVicki in the OR is her sister.    Previous standing AP, tunnel, lateral, and sunrise views of the knee were obtained and reviewed and reveal mild patellofemoral DJD.  Otherwise, no periosteal reaction, no medullary lesions, no osteopenia, and no chondrolysis.      Past Medical History:   Diagnosis Date   ??? Arthritis    ??? GERD (gastroesophageal reflux disease)    ??? Hypercholesterolemia    ??? Hypertension 11/16/2009   ??? Kidney stones    ??? Migraines    ??? Morbid obesity (HCC) 11/16/2009   ??? Seizures (HCC)     stress seizures after my mom died 15 years ago   ??? SUI (stress urinary incontinence)        Past Surgical History:   Procedure Laterality Date   ??? HX ABDOMINOPLASTY  02/2003   ??? HX APPENDECTOMY  2002    appendix attack   ??? HX BREAST REDUCTION Bilateral 02/2003   ??? HX GI  2011    sleeve resection   ??? HX GI  2012    laparotomy   ??? HX HYSTERECTOMY  1983   ??? HX LAP CHOLECYSTECTOMY  2000   ??? HX SALPINGO-OOPHORECTOMY Bilateral        Family History   Problem Relation Age of Onset   ??? Diabetes Mother    ??? Heart Attack Mother    ??? Stroke Mother    ??? Hypertension Mother    ??? Diabetes Brother    ??? Hypertension Brother        Social History      Socioeconomic History   ??? Marital status: SINGLE     Spouse name: Not on file   ??? Number of children: Not on file   ??? Years of education: Not on file   ??? Highest education level: Not on file   Tobacco Use   ??? Smoking status: Never Smoker   ??? Smokeless tobacco: Never Used   Substance and Sexual Activity   ??? Alcohol use: Yes     Alcohol/week: 3.0 standard drinks     Types: 3 Shots of liquor per week   ??? Sexual activity: Yes       Prior to Admission medications    Medication Sig Start Date End Date Taking? Authorizing Provider   losartan 50 mg tab 100 mg, hydroCHLOROthiazide 25  mg tab 25 mg Take 1 Dose by mouth nightly.    Provider, Historical   valACYclovir (VALTREX) 1 gram tablet Take 1,000 mg by mouth two (2) times daily as needed.    Provider, Historical   fexofenadine (ALLEGRA) 180 mg tablet Take 180 mg by mouth daily as needed for Allergies.    Provider, Historical   diphenhydrAMINE-acetaminophen (TYLENOL PM EXTRA STRENGTH) 25-500 mg tab Take 1 Tab by mouth nightly as needed.    Provider, Historical   butalbital-acetaminophen-caffeine (FIORICET) 50-325-40 mg per tablet Take 1 Tab by mouth every six (6) hours as needed.    Provider, Historical   CYANOCOBALAMIN, VITAMIN B-12, (VITAMIN B-12 SL) 1,000 mcg by SubLINGual route daily.    Provider, Historical       No Known Allergies    Review of Systems  A comprehensive review of systems was negative except for that written in the History of Present Illness.      Physical Exam:      There were no vitals taken for this visit.    Physical Exam:  Exam shows a healthy-appearing middle-aged white female.  The right knee has pain on the posteromedial joint line and has mild patellofemoral crepitation and mild patellar inhibition.  Otherwise, examination of the right knee demonstrates the patient has full extension to flexion well beyond 90 degrees.  The patient has a negative Lachman and has no varus or valgus instability at zero degrees or 30 degrees.  There is a negative  posterior sag.  There is no knee effusion.  There is no swelling, ecchymosis, or wounds.  The patient has no lateral joint line pain and no popliteal pain.  The patient has a negative patellar grind and a negative patellar apprehension test.  There is a negative Barista.  There is no pain at the inferior pole of the patella or the tibial tubercle.  The patient has 5/5 muscle strength with good quadriceps tone.  The patient has good capillary refill with normal motor strength of the foot and ankle and normal light-touch sensation in the foot and lower leg.      Assessment/Plan     Principal Problem:    Tear of medial meniscus of right knee, current (11/10/2017)    Active Problems:    Morbid obesity (HCC) (11/16/2009)      Migraine (12/19/2009)      Overview: PO fioricet PRN      Intestinal malabsorption (12/17/2010)      HTN (hypertension) (11/10/2017)      History of kidney stones (11/10/2017)      Dr. Bing Plume scheduled her for a right knee arthroscopy and partial medial meniscectomy  He talked about the risks, alternatives, and benefits including infection, pain, and bleeding, and she is willing to proceed.  He has given her #20 Norco pills for postoperative pain use.  She will crutches afterwards for just a couple of days, along with ice and elevation.  Dr. Bing Plume will see her again one week postop.

## 2017-11-10 NOTE — H&P (Signed)
H&P  by Truett Perna, PA-C at 11/10/17 1135                Author: Coralee Pesa, Lenox Ponds, PA-C  Service: Physician Assistant  Author Type: Physician Assistant       Filed: 11/10/17 1137  Date of Service: 11/10/17 1135  Status: Signed           Editor: Fidencia Mccloud, Talmadge Coventry (Physician Assistant)  Cosigner: Christeen Douglas, MD at 11/11/17 0715                                   History and Physical            Patient: Cassandra Shah               Sex: female          DOA: (Not on file)            Date of Birth:  10-21-1955      Age:  62 y.o.        LOS:  LOS: 0 days            HPI:        Cassandra Shah is in for followup of her right knee posterior horn medial meniscal tear/mild patellofemoral DJD. The patient is here today for evaluation and treatment.  She has seen Dr. Effie Shy in the past, who is out of the office at the present.  She has had  this pain for many years. It has gotten worse her recently. The pain is worse on the medial side and this is her main complaint. She does notice her patellofemoral complaints with arthritis, but this is not as big a concern. They do sail their boat south  to the [place] every October and then bring her back in spring.  Chip Boer in the OR is her sister.      Previous standing AP, tunnel, lateral, and sunrise views of the knee were obtained and reviewed and reveal mild patellofemoral DJD.  Otherwise, no periosteal reaction, no medullary lesions, no osteopenia, and no chondrolysis.           Past Medical History:        Diagnosis  Date         ?  Arthritis       ?  GERD (gastroesophageal reflux disease)       ?  Hypercholesterolemia       ?  Hypertension  11/16/2009     ?  Kidney stones       ?  Migraines       ?  Morbid obesity (HCC)  11/16/2009     ?  Seizures (HCC)            stress seizures after my mom died 15 years ago         ?  SUI (stress urinary incontinence)               Past Surgical History:         Procedure  Laterality  Date          ?  HX  ABDOMINOPLASTY    02/2003     ?  HX APPENDECTOMY    2002          appendix attack          ?  HX BREAST REDUCTION  Bilateral  02/2003     ?  HX GI    2011          sleeve resection          ?  HX GI    2012          laparotomy          ?  HX HYSTERECTOMY    1983     ?  HX LAP CHOLECYSTECTOMY    2000          ?  HX SALPINGO-OOPHORECTOMY  Bilateral               Family History         Problem  Relation  Age of Onset          ?  Diabetes  Mother       ?  Heart Attack  Mother       ?  Stroke  Mother       ?  Hypertension  Mother       ?  Diabetes  Brother            ?  Hypertension  Brother               Social History          Socioeconomic History         ?  Marital status:  SINGLE              Spouse name:  Not on file         ?  Number of children:  Not on file     ?  Years of education:  Not on file     ?  Highest education level:  Not on file       Tobacco Use         ?  Smoking status:  Never Smoker     ?  Smokeless tobacco:  Never Used       Substance and Sexual Activity         ?  Alcohol use:  Yes              Alcohol/week:  3.0 standard drinks         Types:  3 Shots of liquor per week         ?  Sexual activity:  Yes             Prior to Admission medications             Medication  Sig  Start Date  End Date  Taking?  Authorizing Provider            losartan 50 mg tab 100 mg, hydroCHLOROthiazide 25 mg tab 25 mg  Take 1 Dose by mouth nightly.        Provider, Historical     valACYclovir (VALTREX) 1 gram tablet  Take 1,000 mg by mouth two (2) times daily as needed.        Provider, Historical     fexofenadine (ALLEGRA) 180 mg tablet  Take 180 mg by mouth daily as needed for Allergies.        Provider, Historical     diphenhydrAMINE-acetaminophen (TYLENOL PM EXTRA STRENGTH) 25-500 mg tab  Take 1 Tab by mouth nightly as needed.        Provider, Historical     butalbital-acetaminophen-caffeine (FIORICET) 50-325-40 mg per tablet  Take 1 Tab by mouth every six (6)  hours as needed.        Provider, Historical             CYANOCOBALAMIN, VITAMIN B-12, (VITAMIN B-12 SL)  1,000 mcg by SubLINGual route daily.        Provider, Historical           No Known Allergies      Review of Systems   A comprehensive review of systems was negative except for that written in the History of Present Illness.           Physical Exam:         There were no vitals taken for this visit.      Physical Exam:   Exam shows a healthy-appearing middle-aged white female.  The right knee has pain on the posteromedial joint line and has mild patellofemoral crepitation and mild patellar inhibition.  Otherwise, examination of the right knee demonstrates  the patient has full extension to flexion well beyond 90 degrees.  The patient has a negative Lachman and has no varus or valgus instability at zero degrees or 30 degrees.  There is a negative posterior sag.  There is no knee effusion.  There is no swelling,  ecchymosis, or wounds.  The patient has no lateral joint line pain and no popliteal pain.  The patient has a negative patellar grind and a negative patellar apprehension test.  There is a negative Barista.  There is no pain at the inferior pole  of the patella or the tibial tubercle.  The patient has 5/5 muscle strength with good quadriceps tone.  The patient has good capillary refill with normal motor strength of the foot and ankle and normal light-touch sensation in the foot and lower leg.           Assessment/Plan        Principal Problem:     Tear of medial meniscus of right knee, current (11/10/2017)      Active Problems:     Morbid obesity (HCC) (11/16/2009)        Migraine (12/19/2009)       Overview: PO fioricet PRN        Intestinal malabsorption (12/17/2010)        HTN (hypertension) (11/10/2017)        History of kidney stones (11/10/2017)         Dr. Bing Plume scheduled her for a right knee arthroscopy and partial medial meniscectomy  He talked about the risks, alternatives, and benefits including infection, pain, and bleeding, and she is willing to  proceed.  He has given her #20 Norco pills for  postoperative pain use.  She will crutches afterwards for just a couple of days, along with ice and elevation.  Dr. Bing Plume will see her again one week postop.

## 2017-11-12 ENCOUNTER — Inpatient Hospital Stay: Payer: BLUE CROSS/BLUE SHIELD

## 2017-11-12 MED ORDER — SODIUM CHLORIDE 0.9 % IJ SYRG
INTRAMUSCULAR | Status: DC | PRN
Start: 2017-11-12 — End: 2017-11-12

## 2017-11-12 MED ORDER — PROPOFOL 10 MG/ML IV EMUL
10 mg/mL | INTRAVENOUS | Status: DC | PRN
Start: 2017-11-12 — End: 2017-11-12
  Administered 2017-11-12: 13:00:00 via INTRAVENOUS

## 2017-11-12 MED ORDER — NALOXONE 4 MG/ACTUATION NASAL SPRAY
4 mg/actuation | NASAL | 0 refills | Status: AC
Start: 2017-11-12 — End: ?

## 2017-11-12 MED ORDER — NALOXONE 0.4 MG/ML INJECTION
0.4 mg/mL | INTRAMUSCULAR | Status: DC | PRN
Start: 2017-11-12 — End: 2017-11-12

## 2017-11-12 MED ORDER — DEXAMETHASONE SODIUM PHOSPHATE 4 MG/ML IJ SOLN
4 mg/mL | INTRAMUSCULAR | Status: AC
Start: 2017-11-12 — End: ?

## 2017-11-12 MED ORDER — EPHEDRINE 50 MG/5 ML (10 MG/ML) IN NS IV SYRINGE
50 mg/5 mL (10 mg/mL) | INTRAVENOUS | Status: AC
Start: 2017-11-12 — End: ?

## 2017-11-12 MED ORDER — DIPHENHYDRAMINE HCL 50 MG/ML IJ SOLN
50 mg/mL | INTRAMUSCULAR | Status: DC | PRN
Start: 2017-11-12 — End: 2017-11-12

## 2017-11-12 MED ORDER — FENTANYL CITRATE (PF) 50 MCG/ML IJ SOLN
50 mcg/mL | INTRAMUSCULAR | Status: DC | PRN
Start: 2017-11-12 — End: 2017-11-12
  Administered 2017-11-12 (×4): via INTRAVENOUS

## 2017-11-12 MED ORDER — SODIUM CHLORIDE 0.9 % IJ SYRG
INTRAMUSCULAR | Status: AC
Start: 2017-11-12 — End: ?

## 2017-11-12 MED ORDER — HYDROCODONE-ACETAMINOPHEN 5 MG-325 MG TAB
5-325 mg | ORAL_TABLET | Freq: Four times a day (QID) | ORAL | 0 refills | Status: AC | PRN
Start: 2017-11-12 — End: 2017-11-14

## 2017-11-12 MED ORDER — LACTATED RINGERS IV
INTRAVENOUS | Status: DC
Start: 2017-11-12 — End: 2017-11-12
  Administered 2017-11-12: 15:00:00 via INTRAVENOUS

## 2017-11-12 MED ORDER — MIDAZOLAM 1 MG/ML IJ SOLN
1 mg/mL | INTRAMUSCULAR | Status: DC | PRN
Start: 2017-11-12 — End: 2017-11-12
  Administered 2017-11-12: 13:00:00 via INTRAVENOUS

## 2017-11-12 MED ORDER — KETOROLAC TROMETHAMINE 30 MG/ML INJECTION
30 mg/mL (1 mL) | INTRAMUSCULAR | Status: DC | PRN
Start: 2017-11-12 — End: 2017-11-12
  Administered 2017-11-12: 14:00:00 via INTRAVENOUS

## 2017-11-12 MED ORDER — ONDANSETRON (PF) 4 MG/2 ML INJECTION
4 mg/2 mL | Freq: Once | INTRAMUSCULAR | Status: DC
Start: 2017-11-12 — End: 2017-11-12

## 2017-11-12 MED ORDER — GLUCAGON 1 MG INJECTION
1 mg | INTRAMUSCULAR | Status: DC | PRN
Start: 2017-11-12 — End: 2017-11-12

## 2017-11-12 MED ORDER — FENTANYL CITRATE (PF) 50 MCG/ML IJ SOLN
50 mcg/mL | INTRAMUSCULAR | Status: DC | PRN
Start: 2017-11-12 — End: 2017-11-12
  Administered 2017-11-12: 15:00:00 via INTRAVENOUS

## 2017-11-12 MED ORDER — MIDAZOLAM (PF) 1 MG/ML INJECTION SOLUTION
1 mg/mL | INTRAMUSCULAR | Status: AC
Start: 2017-11-12 — End: ?

## 2017-11-12 MED ORDER — ONDANSETRON (PF) 4 MG/2 ML INJECTION
4 mg/2 mL | INTRAMUSCULAR | Status: DC | PRN
Start: 2017-11-12 — End: 2017-11-12
  Administered 2017-11-12: 13:00:00 via INTRAVENOUS

## 2017-11-12 MED ORDER — PROPOFOL 10 MG/ML IV EMUL
10 mg/mL | INTRAVENOUS | Status: AC
Start: 2017-11-12 — End: ?

## 2017-11-12 MED ORDER — HYDROMORPHONE (PF) 1 MG/ML IJ SOLN
1 mg/mL | INTRAMUSCULAR | Status: DC | PRN
Start: 2017-11-12 — End: 2017-11-12

## 2017-11-12 MED ORDER — LIDOCAINE (PF) 20 MG/ML (2 %) IJ SOLN
20 mg/mL (2 %) | INTRAMUSCULAR | Status: DC | PRN
Start: 2017-11-12 — End: 2017-11-12
  Administered 2017-11-12: 13:00:00 via INTRAVENOUS

## 2017-11-12 MED ORDER — GLUCOSE 4 GRAM CHEWABLE TAB
4 gram | ORAL | Status: DC | PRN
Start: 2017-11-12 — End: 2017-11-12

## 2017-11-12 MED ORDER — KETOROLAC TROMETHAMINE 30 MG/ML INJECTION
30 mg/mL (1 mL) | INTRAMUSCULAR | Status: AC
Start: 2017-11-12 — End: ?

## 2017-11-12 MED ORDER — LACTATED RINGERS IV
INTRAVENOUS | Status: DC
Start: 2017-11-12 — End: 2017-11-12
  Administered 2017-11-12 (×2): via INTRAVENOUS

## 2017-11-12 MED ORDER — FENTANYL CITRATE (PF) 50 MCG/ML IJ SOLN
50 mcg/mL | INTRAMUSCULAR | Status: AC
Start: 2017-11-12 — End: ?

## 2017-11-12 MED ORDER — EPHEDRINE (PF) 50 MG/10 ML (5 MG/ML) IN NS IV SYRINGE
50 mg/10 mL (5 mg/mL) | INTRAVENOUS | Status: DC | PRN
Start: 2017-11-12 — End: 2017-11-12
  Administered 2017-11-12 (×2): via INTRAVENOUS

## 2017-11-12 MED ORDER — ONDANSETRON (PF) 4 MG/2 ML INJECTION
4 mg/2 mL | INTRAMUSCULAR | Status: AC
Start: 2017-11-12 — End: ?

## 2017-11-12 MED ORDER — SODIUM CHLORIDE 0.9 % IJ SYRG
Freq: Three times a day (TID) | INTRAMUSCULAR | Status: DC
Start: 2017-11-12 — End: 2017-11-12

## 2017-11-12 MED ORDER — DEXAMETHASONE SODIUM PHOSPHATE 4 MG/ML IJ SOLN
4 mg/mL | INTRAMUSCULAR | Status: DC | PRN
Start: 2017-11-12 — End: 2017-11-12
  Administered 2017-11-12: 13:00:00 via INTRAVENOUS

## 2017-11-12 MED ORDER — HYDROCODONE-ACETAMINOPHEN 5 MG-325 MG TAB
5-325 mg | ORAL | Status: DC | PRN
Start: 2017-11-12 — End: 2017-11-12
  Administered 2017-11-12: 16:00:00 via ORAL

## 2017-11-12 MED ORDER — LACTATED RINGERS IV
INTRAVENOUS | Status: DC | PRN
Start: 2017-11-12 — End: 2017-11-12
  Administered 2017-11-12: 13:00:00

## 2017-11-12 MED ORDER — BUPIVACAINE-EPINEPHRINE (PF) 0.5 %-1:200,000 IJ SOLN
0.5 %-1:200,000 | INTRAMUSCULAR | Status: AC
Start: 2017-11-12 — End: ?

## 2017-11-12 MED ORDER — SODIUM CHLORIDE 0.9 % IV
0.5 %-1:200,000 | INTRAVENOUS | Status: DC | PRN
Start: 2017-11-12 — End: 2017-11-12
  Administered 2017-11-12: 13:00:00

## 2017-11-12 MED ORDER — ALBUTEROL SULFATE 0.083 % (0.83 MG/ML) SOLN FOR INHALATION
2.5 mg /3 mL (0.083 %) | RESPIRATORY_TRACT | Status: DC | PRN
Start: 2017-11-12 — End: 2017-11-12

## 2017-11-12 MED FILL — ONDANSETRON (PF) 4 MG/2 ML INJECTION: 4 mg/2 mL | INTRAMUSCULAR | Qty: 2

## 2017-11-12 MED FILL — DEXAMETHASONE SODIUM PHOSPHATE 4 MG/ML IJ SOLN: 4 mg/mL | INTRAMUSCULAR | Qty: 1

## 2017-11-12 MED FILL — BD POSIFLUSH NORMAL SALINE 0.9 % INJECTION SYRINGE: INTRAMUSCULAR | Qty: 40

## 2017-11-12 MED FILL — BUPIVACAINE-EPINEPHRINE (PF) 0.5 %-1:200,000 IJ SOLN: 0.5 %-1:200,000 | INTRAMUSCULAR | Qty: 30

## 2017-11-12 MED FILL — PROPOFOL 10 MG/ML IV EMUL: 10 mg/mL | INTRAVENOUS | Qty: 20

## 2017-11-12 MED FILL — KETOROLAC TROMETHAMINE 30 MG/ML INJECTION: 30 mg/mL (1 mL) | INTRAMUSCULAR | Qty: 1

## 2017-11-12 MED FILL — LACTATED RINGERS IV: INTRAVENOUS | Qty: 1000

## 2017-11-12 MED FILL — BD POSIFLUSH NORMAL SALINE 0.9 % INJECTION SYRINGE: INTRAMUSCULAR | Qty: 30

## 2017-11-12 MED FILL — EPHEDRINE 50 MG/5 ML (10 MG/ML) IN NS IV SYRINGE: 50 mg/5 mL (10 mg/mL) | INTRAVENOUS | Qty: 5

## 2017-11-12 MED FILL — MIDAZOLAM (PF) 1 MG/ML INJECTION SOLUTION: 1 mg/mL | INTRAMUSCULAR | Qty: 2

## 2017-11-12 MED FILL — FENTANYL CITRATE (PF) 50 MCG/ML IJ SOLN: 50 mcg/mL | INTRAMUSCULAR | Qty: 2

## 2017-11-12 MED FILL — HYDROCODONE-ACETAMINOPHEN 5 MG-325 MG TAB: 5-325 mg | ORAL | Qty: 1

## 2017-11-12 NOTE — Other (Signed)
Family updated.

## 2017-11-12 NOTE — Interval H&P Note (Signed)
H&P Update:  Cassandra Shah was seen and examined.  History and physical has been reviewed. The patient has been examined. There have been no significant clinical changes since the completion of the originally dated History and Physical.  Patient identified by surgeon; surgical site was confirmed by patient and surgeon.

## 2017-11-12 NOTE — Anesthesia Pre-Procedure Evaluation (Signed)
Relevant Problems   No relevant active problems       Anesthetic History   No history of anesthetic complications            Review of Systems / Medical History  Patient summary reviewed, nursing notes reviewed and pertinent labs reviewed    Pulmonary  Within defined limits                 Neuro/Psych   Within defined limits           Cardiovascular    Hypertension              Exercise tolerance: >4 METS     GI/Hepatic/Renal     GERD           Endo/Other        Arthritis     Other Findings              Physical Exam    Airway  Mallampati: II  TM Distance: 4 - 6 cm  Neck ROM: normal range of motion        Cardiovascular  Regular rate and rhythm,  S1 and S2 normal,  no murmur, click, rub, or gallop             Dental  No notable dental hx       Pulmonary  Breath sounds clear to auscultation               Abdominal  GI exam deferred       Other Findings            Anesthetic Plan    ASA: 2  Anesthesia type: general          Induction: Intravenous  Anesthetic plan and risks discussed with: Patient

## 2017-11-12 NOTE — Op Note (Signed)
SURGICAL ARTHROSCOPY RIGHT  KNEE, PARTIAL LATERAL MENISCECTOMY    Patient: Cassandra Shah MRN: [id number]  CSN: [id number]   Date of Birth: July 30, 1955  Age: 62 y.o.  Sex: female      Date of Surgery:11/12/2017      PREOPERATIVE DIAGNOSIS: Right posterior horn lateral meniscal tear.    POSTOPERATIVE DIAGNOSIS: Right posterior horn lateral meniscal tear.    PROCEDURES PERFORMED: Surgical arthroscopy right knee with partial lateral meniscectomy.    IMPLANTS: * No implants in log *    SURGEON: Luna GlasgowBoyd W. Haynes III, MD    FIRST ASSISTANT: Larena Soxhristopher Schwizer, PA-C    SECOND ASSISTANT: Physician Assistant: Coralee PesaSchwizer, Lenox Pondshristopher G, PA-C    ANESTHESIA: General    COMPLICATIONS: None.    TOURNIQUET TIME: None.    ESTIMATED BLOOD LOSS: Minimal.    FINDINGS: Same    SPECIMENS REMOVED: none    INDICATIONS: A 62 y.o. year-old WHITE OR CAUC[place] female with known right knee lateral meniscal tear as documented by MRI. The patient now presents for arthroscopic treatment due to continued pain despite conservative intervention for mechanical symptoms associated with knee pain from the recent meniscal tear.    DESCRIPTION OF PROCEDURE: The patient was brought to the operating theater  and after adequate anesthesia, the right knee was prepped and draped in the  typical sterile fashion. The knee was instilled with 30 mL of 0.25%  Marcaine with 1:200,000 epinephrine. Standard  anterolateral and anteromedial portals were anesthetized with the same  mixture without morphine.    The arthroscope was placed laterally and the probe medially. Findings at this time showed that the medial compartment had Grade 3 articular surfaces with no medial meniscus on probing.    No chondroplasty was required of the medial femoral condyle. The ACL and PCL were normal. The lateral joint line was examined in the figure-of-four position.  The lateral compartment had Normal articular surfaces with a oblique flap  lateral meniscus tear. Using the upbiting punch, this was balanced and contoured, removing approximately 20% of the entire size of the lateral meniscus. All free fragments were removed with the Incisor Plus blade. Further probing showed a nice stable meniscal rim. The patellofemoral joint was examined last in terminal extension. It showed a Grade 3 trochlea and Grade 3 patella.  The joint was then thoroughly irrigated and decompressed. There was no evidence of synovitis and both the medial and lateral gutters were cleared  of any loose bodies. The joint space was decompressed. The  4-0 Monocryl's were used to close the portals. They were steri-stripped,  had 4 x 4's placed and an Ace wrap. The patient was then awakened from  anesthesia, and returned to the recovery room in awake, in stable  condition. All instrument, sponge and needle counts were correct.        ??Christeen DouglasBoyd W Haynes, MD  11/12/2017

## 2017-11-12 NOTE — Other (Signed)
Reviewed PTA medication list with patient/caregiver and patient/caregiver denies any additional medications.     Patient admits to having a responsible adult care for them at home for at least 24 hours after surgery.    Patient denies andy chewing/swallowing difficulties.    Patient encouraged to use Bair paws warming system and informed that using Bair paws to regulate body temperature prior to a procedure has been shown to help reduce the risks of blood clots and infection.    Dual skin assessment & fall risk band verification completed with Karen Smith, RN.

## 2017-11-12 NOTE — Anesthesia Post-Procedure Evaluation (Signed)
Post-Anesthesia Evaluation and Assessment    Cardiovascular Function/Vital Signs  Visit Vitals  BP 121/59   Pulse 69   Temp 36.4 ??C (97.5 ??F)   Resp 11   Ht 5' 2" (1.575 m)   Wt 80.5 kg (177 lb 6 oz)   SpO2 100%   BMI 32.44 kg/m??       Patient is status post Procedure(s):  SURGICAL ARTHROSCOPY RIGHT KNEE/PARTIALLATERAL  MENISCECTOMY.    Nausea/Vomiting: Controlled.    Postoperative hydration reviewed and adequate.    Pain:  Pain Scale 1: Numeric (0 - 10) (11/12/17 1040)  Pain Intensity 1: 2 (11/12/17 1040)   Managed.    Neurological Status:   Neuro (WDL): Within Defined Limits (11/12/17 1040)   At baseline.    Mental Status and Level of Consciousness: Baseline and appropriate for discharge.    Pulmonary Status:   O2 Device: Room air (11/12/17 1040)   Adequate oxygenation and airway patent.    Complications related to anesthesia: None    Post-anesthesia assessment completed. No concerns.    Patient has met all discharge requirements.    Signed By: Jeanann Balinski, MD    November 12, 2017

## 2017-11-12 NOTE — Anesthesia Pre-Procedure Evaluation (Signed)
Relevant Problems   No relevant active problems       Anesthetic History   No history of anesthetic complications            Review of Systems / Medical History  Patient summary reviewed, nursing notes reviewed and pertinent labs reviewed    Pulmonary  Within defined limits                 Neuro/Psych   Within defined limits           Cardiovascular    Hypertension              Exercise tolerance: >4 METS     GI/Hepatic/Renal     GERD           Endo/Other        Arthritis     Other Findings              Physical Exam    Airway  Mallampati: II  TM Distance: 4 - 6 cm  Neck ROM: normal range of motion        Cardiovascular  Regular rate and rhythm,  S1 and S2 normal,  no murmur, click, rub, or gallop             Dental  No notable dental hx       Pulmonary  Breath sounds clear to auscultation               Abdominal  GI exam deferred       Other Findings            Anesthetic Plan    ASA: 2  Anesthesia type: general          Induction: Intravenous  Anesthetic plan and risks discussed with: Patient

## 2017-11-12 NOTE — Interval H&P Note (Signed)
Reviewed PTA medication list with patient/caregiver and patient/caregiver denies any additional medications.     Patient admits to having a responsible adult care for them at home for at least 24 hours after surgery.    Patient denies andy chewing/swallowing difficulties.    Patient encouraged to use Bair paws warming system and informed that using Bair paws to regulate body temperature prior to a procedure has been shown to help reduce the risks of blood clots and infection.    Dual skin assessment & fall risk band verification completed with Karen Smith, RN.

## 2017-11-12 NOTE — Anesthesia Post-Procedure Evaluation (Signed)
Post-Anesthesia Evaluation and Assessment    Cardiovascular Function/Vital Signs  Visit Vitals  BP 121/59   Pulse 69   Temp 36.4 ??C (97.5 ??F)   Resp 11   Ht _0  (1.575 m)   Wt 80.5 kg (177 lb 6 oz)   SpO2 100%   BMI 32.44 kg/m??       Patient is status post Procedure(s):  SURGICAL ARTHROSCOPY RIGHT KNEE/PARTIALLATERAL  MENISCECTOMY.    Nausea/Vomiting: Controlled.    Postoperative hydration reviewed and adequate.    Pain:  Pain Scale 1: Numeric (0 - 10) (11/12/17 1040)  Pain Intensity 1: 2 (11/12/17 1040)   Managed.    Neurological Status:   Neuro (WDL): Within Defined Limits (11/12/17 1040)   At baseline.    Mental Status and Level of Consciousness: Baseline and appropriate for discharge.    Pulmonary Status:   O2 Device: Room air (11/12/17 1040)   Adequate oxygenation and airway patent.    Complications related to anesthesia: None    Post-anesthesia assessment completed. No concerns.    Patient has met all discharge requirements.    Signed By: Sela Hilding, MD    November 12, 2017

## 2017-11-12 NOTE — Interval H&P Note (Signed)
Family updated.

## 2017-11-12 NOTE — Op Note (Signed)
Op Notes by Christeen Douglas, MD at 11/12/17 [phone]                Author: Christeen Douglas, MD  Service: Orthopedic Surgery  Author Type: Physician       Filed: 11/12/17 0945  Date of Service: 11/12/17 0943  Status: Signed          Editor: Christeen Douglas, MD (Physician)                       SURGICAL ARTHROSCOPY RIGHT  KNEE, PARTIAL LATERAL MENISCECTOMY          Patient: Cassandra Shah  MRN: [id number]   CSN: [id number]         Date of Birth: 01/07/1956   Age: 62 y.o.   Sex: female         Date of Surgery:11/12/2017         PREOPERATIVE DIAGNOSIS: Right posterior horn lateral meniscal tear.      POSTOPERATIVE DIAGNOSIS: Right posterior horn lateral meniscal tear.      PROCEDURES PERFORMED: Surgical arthroscopy right knee with partial lateral meniscectomy.      IMPLANTS: * No implants in log *      SURGEON: Luna Glasgow, MD      FIRST ASSISTANT: Larena Sox, PA-C      SECOND ASSISTANT: Physician Assistant: Coralee Pesa, Lenox Ponds, PA-C      ANESTHESIA: General      COMPLICATIONS: None.      TOURNIQUET TIME: None.      ESTIMATED BLOOD LOSS: Minimal.      FINDINGS: Same      SPECIMENS REMOVED: none      INDICATIONS: A 62 y.o. year-old WHITE [place]UC[place]  female with known right knee lateral meniscal tear as documented by MRI. The patient now presents for arthroscopic treatment due to continued  pain despite conservative intervention for mechanical symptoms associated with knee pain from the recent meniscal tear.      DESCRIPTION OF PROCEDURE: The patient was brought to the operating theater   and after adequate anesthesia, the right knee was prepped and draped in the   typical sterile fashion. The knee was instilled with 30 mL of 0.25%   Marcaine with 1:200,000 epinephrine. Standard   anterolateral and anteromedial portals were anesthetized with the same   mixture without morphine.      The arthroscope was placed laterally and the probe medially. Findings at this time showed that the medial  compartment  had Grade 3 articular surfaces with no  medial meniscus on probing.    No chondroplasty was required of the medial femoral condyle. The ACL and PCL were normal. The lateral joint  line was examined in the figure-of-four position.  The lateral compartment had  Normal articular surfaces with a oblique flap  lateral meniscus tear. Using the upbiting punch, this was balanced and contoured,  removing approximately 20% of the entire size of the lateral meniscus. All free fragments were removed with the Incisor Plus blade. Further probing showed a nice stable meniscal rim. The patellofemoral  joint was examined last in terminal extension. It showed a Grade 3 trochlea and  Grade 3 patella.  The joint  was then thoroughly irrigated and decompressed. There was no evidence of synovitis and both the medial and lateral gutters were cleared   of any loose bodies. The joint space was decompressed. The   4-0 Monocryl's were used to close the  portals. They were steri-stripped,   had 4 x 4's placed and an Ace wrap. The patient was then awakened from   anesthesia, and returned to the recovery room in awake, in stable   condition. All instrument, sponge and needle counts were correct.            ??Christeen DouglasBoyd W Haynes, MD   11/12/2017

## 2017-11-25 NOTE — Addendum Note (Signed)
Addendum  created 11/25/17 1652 by Merilynn FinlandAngus, Tonnie Friedel, MD    SmartForm saved

## 2017-11-25 NOTE — Addendum Note (Signed)
Addendum  created 11/25/17 1654 by Merilynn FinlandAngus, Welden Hausmann, MD    SmartForm saved

## 2017-11-25 NOTE — Addendum Note (Signed)
Addendum         created 11/25/17 1652 by Merilynn FinlandAngus, Jaquise Faux, MD                   SmartForm saved

## 2017-11-25 NOTE — Addendum Note (Signed)
Addendum         created 11/25/17 1654 by Merilynn FinlandAngus, Nyra Anspaugh, MD                   SmartForm saved

## 2018-03-22 ENCOUNTER — Other Ambulatory Visit (RURAL_HEALTH_CENTER): Payer: Self-pay | Admitting: Family Medicine

## 2018-03-23 ENCOUNTER — Other Ambulatory Visit (RURAL_HEALTH_CENTER): Payer: Self-pay | Admitting: Family Medicine

## 2018-03-23 MED ORDER — LEVOTHYROXINE 175 MCG TABLET
175.00 ug | ORAL_TABLET | Freq: Every morning | ORAL | 5 refills | Status: DC
Start: 2018-03-23 — End: 2018-07-02

## 2018-03-23 MED ORDER — NITROGLYCERIN 0.4 MG SUBLINGUAL TABLET
0.4000 mg | SUBLINGUAL_TABLET | SUBLINGUAL | 5 refills | Status: DC | PRN
Start: 2018-03-23 — End: 2019-07-01

## 2018-03-23 NOTE — Telephone Encounter (Signed)
Last OV: 02/08/18  Next OV: 06/14/18

## 2018-03-24 NOTE — Telephone Encounter (Signed)
LOV: 02/08/18  NOV: 06/14/18

## 2018-04-17 ENCOUNTER — Other Ambulatory Visit (RURAL_HEALTH_CENTER): Payer: Self-pay | Admitting: Family Medicine

## 2018-05-05 ENCOUNTER — Encounter (HOSPITAL_COMMUNITY): Payer: Self-pay | Admitting: Family Medicine

## 2018-05-05 DIAGNOSIS — E785 Hyperlipidemia, unspecified: Secondary | ICD-10-CM

## 2018-05-05 DIAGNOSIS — M25511 Pain in right shoulder: Secondary | ICD-10-CM

## 2018-05-05 DIAGNOSIS — Z955 Presence of coronary angioplasty implant and graft: Secondary | ICD-10-CM

## 2018-05-05 DIAGNOSIS — J449 Chronic obstructive pulmonary disease, unspecified: Secondary | ICD-10-CM

## 2018-05-05 DIAGNOSIS — I509 Heart failure, unspecified: Secondary | ICD-10-CM

## 2018-05-05 DIAGNOSIS — F411 Generalized anxiety disorder: Secondary | ICD-10-CM

## 2018-05-05 DIAGNOSIS — I2510 Atherosclerotic heart disease of native coronary artery without angina pectoris: Secondary | ICD-10-CM

## 2018-05-05 DIAGNOSIS — K219 Gastro-esophageal reflux disease without esophagitis: Secondary | ICD-10-CM

## 2018-05-05 DIAGNOSIS — E039 Hypothyroidism, unspecified: Secondary | ICD-10-CM

## 2018-05-05 DIAGNOSIS — I10 Essential (primary) hypertension: Secondary | ICD-10-CM

## 2018-05-05 DIAGNOSIS — F17210 Nicotine dependence, cigarettes, uncomplicated: Secondary | ICD-10-CM

## 2018-05-05 HISTORY — DX: Hyperlipidemia, unspecified: E78.5

## 2018-05-05 HISTORY — DX: Nicotine dependence, cigarettes, uncomplicated: F17.210

## 2018-05-05 HISTORY — DX: Atherosclerotic heart disease of native coronary artery without angina pectoris: I25.10

## 2018-05-05 HISTORY — DX: Heart failure, unspecified: I50.9

## 2018-05-05 HISTORY — DX: Presence of coronary angioplasty implant and graft: Z95.5

## 2018-05-05 HISTORY — DX: Essential (primary) hypertension: I10

## 2018-05-05 HISTORY — DX: Pain in right shoulder: M25.511

## 2018-05-05 HISTORY — DX: Chronic obstructive pulmonary disease, unspecified: J44.9

## 2018-05-05 HISTORY — DX: Generalized anxiety disorder: F41.1

## 2018-05-05 HISTORY — DX: Gastro-esophageal reflux disease without esophagitis: K21.9

## 2018-05-05 HISTORY — DX: Hypothyroidism, unspecified: E03.9

## 2018-05-10 ENCOUNTER — Other Ambulatory Visit (RURAL_HEALTH_CENTER): Payer: Self-pay | Admitting: Family Medicine

## 2018-05-17 ENCOUNTER — Other Ambulatory Visit (RURAL_HEALTH_CENTER): Payer: Self-pay | Admitting: Family Medicine

## 2018-06-14 ENCOUNTER — Other Ambulatory Visit (RURAL_HEALTH_CENTER): Payer: Self-pay | Admitting: Family Medicine

## 2018-06-14 ENCOUNTER — Encounter (RURAL_HEALTH_CENTER): Payer: Self-pay | Admitting: Family Medicine

## 2018-07-01 ENCOUNTER — Ambulatory Visit: Payer: Medicare Other | Attending: Family Medicine

## 2018-07-01 ENCOUNTER — Other Ambulatory Visit: Payer: Self-pay

## 2018-07-01 DIAGNOSIS — E785 Hyperlipidemia, unspecified: Secondary | ICD-10-CM | POA: Insufficient documentation

## 2018-07-01 DIAGNOSIS — I519 Heart disease, unspecified: Secondary | ICD-10-CM | POA: Insufficient documentation

## 2018-07-01 DIAGNOSIS — E039 Hypothyroidism, unspecified: Secondary | ICD-10-CM | POA: Insufficient documentation

## 2018-07-01 DIAGNOSIS — R5383 Other fatigue: Principal | ICD-10-CM | POA: Insufficient documentation

## 2018-07-01 LAB — CBC WITH DIFF
BASOPHIL #: <0.1 x10ˆ3/uL (ref <=0.20)
BASOPHIL %: 1 %
EOSINOPHIL #: <0.1 x10ˆ3/uL (ref <=0.50)
EOSINOPHIL %: 1 %
HCT: 41.6 % (ref 34.8–46.0)
HGB: 13.8 g/dL (ref 11.5–16.0)
IMMATURE GRANULOCYTE #: <0.1 10*3/uL (ref <0.10)
IMMATURE GRANULOCYTE %: 0 % (ref 0–1)
LYMPHOCYTE #: 2.54 x10ˆ3/uL (ref 1.00–4.80)
LYMPHOCYTE %: 30 %
MCH: 31.4 pg (ref 26.0–32.0)
MCHC: 33.2 g/dL (ref 31.0–35.5)
MCV: 94.5 fL (ref 78.0–100.0)
MONOCYTE #: 0.5 x10ˆ3/uL (ref 0.20–1.10)
MONOCYTE %: 6 %
MPV: 10.9 fL (ref 8.7–12.5)
NEUTROPHIL #: 5.2 x10ˆ3/uL (ref 1.50–7.70)
NEUTROPHIL %: 62 %
PLATELETS: 284 x10ˆ3/uL (ref 150–400)
RBC: 4.4 x10ˆ6/uL (ref 3.85–5.22)
RDW-CV: 12.9 % (ref 11.5–15.5)
WBC: 8.4 x10ˆ3/uL (ref 3.7–11.0)

## 2018-07-01 LAB — LIPID PANEL
CHOLESTEROL: 139 mg/dL (ref 0–199)
HDL CHOL: 39 mg/dL — ABNORMAL LOW (ref 40–59)
LDL CALC: 80 mg/dL (ref 0–99)
TRIGLYCERIDES: 101 mg/dL (ref 0–149)
VLDL CALC: 20 mg/dL (ref 0–50)

## 2018-07-01 LAB — THYROID STIMULATING HORMONE (SENSITIVE TSH): TSH: <0.015 u[IU]/mL — ABNORMAL LOW (ref 0.470–4.680)

## 2018-07-01 LAB — COMPREHENSIVE METABOLIC PANEL, NON-FASTING
ALBUMIN: 3.8 g/dL (ref 3.5–5.0)
ALKALINE PHOSPHATASE: 144 U/L — ABNORMAL HIGH (ref 38–126)
ALT (SGPT): 20 U/L (ref 0–34)
ANION GAP: 9 mmol/L (ref 8–16)
AST (SGOT): 20 U/L (ref 14–36)
BILIRUBIN TOTAL: 0.7 mg/dL (ref 0.2–1.3)
BUN/CREA RATIO: 16
BUN: 11 mg/dL (ref 7–17)
CALCIUM: 9.2 mg/dL (ref 8.4–10.2)
CHLORIDE: 106 mmol/L (ref 98–107)
CO2 TOTAL: 23 mmol/L (ref 22–30)
CREATININE: 0.7 mg/dL (ref 0.52–1.04)
ESTIMATED GFR: >60 mL/min/1.73mˆ2 (ref >60)
GLUCOSE: 119 mg/dL — ABNORMAL HIGH (ref 74–106)
POTASSIUM: 4 mmol/L (ref 3.5–5.1)
PROTEIN TOTAL: 6.9 g/dL (ref 6.3–8.2)
SODIUM: 138 mmol/L (ref 137–145)

## 2018-07-02 ENCOUNTER — Other Ambulatory Visit (RURAL_HEALTH_CENTER): Payer: Self-pay | Admitting: Family Medicine

## 2018-07-02 MED ORDER — LEVOTHYROXINE 150 MCG TABLET
150.00 ug | ORAL_TABLET | Freq: Every morning | ORAL | 5 refills | Status: DC
Start: 2018-07-02 — End: 2018-12-03

## 2018-07-05 ENCOUNTER — Ambulatory Visit: Payer: Medicare Other | Attending: Family Medicine | Admitting: Family Medicine

## 2018-07-05 ENCOUNTER — Encounter (RURAL_HEALTH_CENTER): Payer: Self-pay | Admitting: Family Medicine

## 2018-07-05 ENCOUNTER — Telehealth (RURAL_HEALTH_CENTER): Payer: Self-pay | Admitting: Family Medicine

## 2018-07-05 DIAGNOSIS — I2510 Atherosclerotic heart disease of native coronary artery without angina pectoris: Secondary | ICD-10-CM | POA: Insufficient documentation

## 2018-07-05 DIAGNOSIS — E039 Hypothyroidism, unspecified: Secondary | ICD-10-CM | POA: Insufficient documentation

## 2018-07-05 DIAGNOSIS — E782 Mixed hyperlipidemia: Secondary | ICD-10-CM | POA: Insufficient documentation

## 2018-07-05 DIAGNOSIS — K219 Gastro-esophageal reflux disease without esophagitis: Secondary | ICD-10-CM | POA: Insufficient documentation

## 2018-07-05 DIAGNOSIS — F17210 Nicotine dependence, cigarettes, uncomplicated: Secondary | ICD-10-CM | POA: Insufficient documentation

## 2018-07-05 DIAGNOSIS — I110 Hypertensive heart disease with heart failure: Secondary | ICD-10-CM | POA: Insufficient documentation

## 2018-07-05 DIAGNOSIS — Z955 Presence of coronary angioplasty implant and graft: Secondary | ICD-10-CM

## 2018-07-05 DIAGNOSIS — I509 Heart failure, unspecified: Secondary | ICD-10-CM

## 2018-07-05 DIAGNOSIS — J449 Chronic obstructive pulmonary disease, unspecified: Secondary | ICD-10-CM | POA: Insufficient documentation

## 2018-07-05 DIAGNOSIS — I10 Essential (primary) hypertension: Secondary | ICD-10-CM

## 2018-07-05 DIAGNOSIS — F411 Generalized anxiety disorder: Secondary | ICD-10-CM

## 2018-07-05 DIAGNOSIS — Z716 Tobacco abuse counseling: Secondary | ICD-10-CM | POA: Insufficient documentation

## 2018-07-05 MED ORDER — CLONAZEPAM 1 MG TABLET
ORAL_TABLET | ORAL | 5 refills | Status: DC
Start: 2018-07-05 — End: 2018-12-30

## 2018-07-05 NOTE — Progress Notes (Signed)
[name]  [name] [id number]  [phone]   Progress Note    Date of Service:  07/05/2018  [name], [name], 63 y.o. female  Date of Birth:  07-12-1955  PCP: [name] Loa, MD    TELEMEDICINE DOCUMENTATION:    Patient Location:  MyChart video visit from home address: [address]  [address]  [place] [address]    Patient/family aware of provider location:  yes  Patient/family consent for telemedicine:  yes  Examination observed and performed by:  [name] Loa, MD      I personally offered the service to the patient, and obtained verbal consent to provide this service.    [name] Loa, MD          Due to the recent increase in COVID-19 infections we have elected to recheck our patient population  either over the phone or through use of telemedicine visits to prevent readmission and to meet patient's further needs as an outpatient. This had been approved by our local and national government to do so.     Chief Complaint:  Follow Up 4 Months (Med evaluation)       HPI:  [name] [name] is a 63 y.o. White female who was contacted by the [facility] for follow-up on her heart blood pressure and thyroid.  Patient had blood work done recently which demonstrated her TSH to be suppressed.  She was decreased back down from 0.175 down 2.1 5 on her Synthroid dose.  The patient states she has not really had any chest pains palpitations PND orthopnea.  She follows regularly with her cardiologist from [place] here and [place].  The patient has not had any significant swelling or ankles and does need her clonazepam refilled at this time.  Patient is having no significant fever chills rigors or rashes.  The patient has not had any nausea vomiting diarrhea melena or hematochezia.  The patient at this point seems to be doing much better without any significant cardiac symptoms.  She has not had any  significant headache dizziness vertigo or reflux.  The patient's having no significant cough cold or flus no fever chills or rigors.  The patient has not had any exposure to anyone with any recent illnesses.    Patient's medications reconciled and current.    Patient's review of system is negative except for positives listed in the HPI.    Past Medical History:   Diagnosis Date   . CAD (coronary artery disease) 05/05/2018   . CHF (congestive heart failure) (CMS HCC) 05/05/2018   . COPD (chronic obstructive pulmonary disease) (CMS [place]) 05/05/2018   . GAD (generalized anxiety disorder) 05/05/2018   . GERD (gastroesophageal reflux disease) 05/05/2018   . History of heart artery stent 05/05/2018   . Hyperlipidemia 05/05/2018   . Hypertension 05/05/2018   . Hypothyroidism 05/05/2018   . Nicotine dependence, cigarettes, uncomplicated 1/32/4401   . Right shoulder pain 05/05/2018      Past Surgical History:   Procedure Laterality Date   . CORONARY ARTERY ANGIOPLASTY     . HX APPENDECTOMY     . HX CESAREAN SECTION     . HX HEART CATHETERIZATION     . LEG SURGERY Right       Social History     Tobacco Use   . Smoking status: Current Every Day Smoker   . Smokeless tobacco: Never  Used   . Tobacco comment: one pack daily since age 43   Substance Use Topics   . Alcohol use: Yes     Comment: occasional   . Drug use: Not on file       Family Medical History:     Problem Relation (Age of Onset)    Cancer Other    Diabetes Other    Lymphoma Other         No outpatient medications have been marked as taking for the 07/05/18 encounter (Telephone) with [name] Loa, MD.      Allergies   Allergen Reactions   . Sulfa (Sulfonamides)         REVIEW OF SYSTEMS:  GENERAL: negative for fatigue, significant weight change, sleep disturbance  HEENT: No recent visual or hearing changes.   RESPIRATORY: No increase SOB, cough, wheezes  CARDIAC: No reason chest pains; no palpitations; no edema  GI: no N/V/D, no abdominal pain.  BMs  regular  MUSCULOSKELETAL: no myalgias, no joint pain, no edema or erythema  NEUROLOGIC: no headache, weakness, numbness or tingling  GU: no urinary sxs or flank pain  SKIN:  negative for rashes.    Physical exam:  No physical exam performed with the virtual visit today.    Assessment/Plan:  1. Coronary artery disease, angina presence unspecified, unspecified vessel or lesion type, unspecified whether native or transplanted heart    2. Congestive heart failure, unspecified HF chronicity, unspecified heart failure type (CMS HCC)    3. Essential hypertension    4. Mixed hyperlipidemia    5. Gastroesophageal reflux disease, esophagitis presence not specified    6. Hypothyroidism, unspecified type    7. GAD (generalized anxiety disorder)    8. History of heart artery stent    9. Chronic obstructive pulmonary disease, unspecified COPD type (CMS HCC)    10. Nicotine dependence, cigarettes, uncomplicated         Follow-up in 4 months    Patient was contacted follow-up on her heart disease anxiety and thyroid.  She has hyperlipidemia and is stable on all her current medicines.  She does follow with Cardiology and she has had no recent congestive heart failure episodes.  She continues her Coreg Lasix for that as well.  The patient has not had any significant headache dizziness or vertigo.  The patient's medications are otherwise stable and she has not had any significant complications with her blood pressure.  Patient's reflux is stable on her pantoprazole on her thyroid dose was recently adjusted.  She does need her clonazepam refilled and that to be sent to the pharmacy.  Her COPD is stable at this time as well.  Patient will continue all current treatments and return to office in 4 months when she will be due for repeat blood work unless she has further problems inch return sooner.  The patient had been advised to have a TSH done prior to her next appointment since she has to wait 4 months  to be seen.    Patient advised  on the adverse effects of tobacco.  Patient was instructed of the benefits of quitting.  Patient was provided the opportunity to be referred to tobacco dependence groups if patient would like and patient was also offered nicotine replacement products.  I have spent 5 minutes counseling the patient.    This is an established Patient. Pt has been seen in last 3 years.   [name] Loa, MD

## 2018-07-30 ENCOUNTER — Other Ambulatory Visit (RURAL_HEALTH_CENTER): Payer: Self-pay | Admitting: Family Medicine

## 2018-08-04 ENCOUNTER — Other Ambulatory Visit: Payer: Self-pay

## 2018-08-27 ENCOUNTER — Other Ambulatory Visit (RURAL_HEALTH_CENTER): Payer: Self-pay | Admitting: Nurse Practitioner

## 2018-10-22 ENCOUNTER — Other Ambulatory Visit (HOSPITAL_COMMUNITY): Payer: Self-pay | Admitting: NURSE PRACTITIONER

## 2018-10-22 DIAGNOSIS — R609 Edema, unspecified: Secondary | ICD-10-CM | POA: Insufficient documentation

## 2018-10-22 DIAGNOSIS — R601 Generalized edema: Secondary | ICD-10-CM

## 2018-10-22 DIAGNOSIS — M79604 Pain in right leg: Secondary | ICD-10-CM

## 2018-10-23 ENCOUNTER — Other Ambulatory Visit: Payer: Self-pay

## 2018-10-23 ENCOUNTER — Ambulatory Visit
Admission: RE | Admit: 2018-10-23 | Discharge: 2018-10-23 | Disposition: A | Discharge status: Home / Self Care | Payer: Medicare Other | Source: Ambulatory Visit | Attending: NURSE PRACTITIONER | Admitting: NURSE PRACTITIONER

## 2018-10-23 DIAGNOSIS — M79604 Pain in right leg: Secondary | ICD-10-CM | POA: Insufficient documentation

## 2018-10-23 DIAGNOSIS — R601 Generalized edema: Secondary | ICD-10-CM | POA: Insufficient documentation

## 2018-11-09 ENCOUNTER — Encounter (RURAL_HEALTH_CENTER): Payer: Self-pay | Admitting: Family Medicine

## 2018-12-03 ENCOUNTER — Other Ambulatory Visit (RURAL_HEALTH_CENTER): Payer: Self-pay | Admitting: Family Medicine

## 2018-12-07 ENCOUNTER — Encounter (RURAL_HEALTH_CENTER): Payer: Self-pay | Admitting: Family Medicine

## 2018-12-25 ENCOUNTER — Other Ambulatory Visit (RURAL_HEALTH_CENTER): Payer: Self-pay | Admitting: Nurse Practitioner

## 2018-12-30 ENCOUNTER — Other Ambulatory Visit: Payer: Self-pay

## 2018-12-30 ENCOUNTER — Encounter (RURAL_HEALTH_CENTER): Payer: Self-pay | Admitting: Family Medicine

## 2018-12-30 ENCOUNTER — Ambulatory Visit: Payer: Medicare Other | Attending: Family Medicine | Admitting: Family Medicine

## 2018-12-30 VITALS — BP 122/83 | HR 86 | Temp 97.1°F | Resp 18 | Ht 68.0 in | Wt 198.0 lb

## 2018-12-30 DIAGNOSIS — J449 Chronic obstructive pulmonary disease, unspecified: Secondary | ICD-10-CM | POA: Insufficient documentation

## 2018-12-30 DIAGNOSIS — F411 Generalized anxiety disorder: Secondary | ICD-10-CM | POA: Insufficient documentation

## 2018-12-30 DIAGNOSIS — Z955 Presence of coronary angioplasty implant and graft: Secondary | ICD-10-CM | POA: Insufficient documentation

## 2018-12-30 DIAGNOSIS — D1723 Benign lipomatous neoplasm of skin and subcutaneous tissue of right leg: Secondary | ICD-10-CM | POA: Insufficient documentation

## 2018-12-30 DIAGNOSIS — F17210 Nicotine dependence, cigarettes, uncomplicated: Secondary | ICD-10-CM

## 2018-12-30 DIAGNOSIS — I110 Hypertensive heart disease with heart failure: Secondary | ICD-10-CM | POA: Insufficient documentation

## 2018-12-30 DIAGNOSIS — E039 Hypothyroidism, unspecified: Secondary | ICD-10-CM | POA: Insufficient documentation

## 2018-12-30 DIAGNOSIS — I10 Essential (primary) hypertension: Secondary | ICD-10-CM

## 2018-12-30 DIAGNOSIS — Z79890 Hormone replacement therapy (postmenopausal): Secondary | ICD-10-CM | POA: Insufficient documentation

## 2018-12-30 DIAGNOSIS — I509 Heart failure, unspecified: Secondary | ICD-10-CM

## 2018-12-30 DIAGNOSIS — E782 Mixed hyperlipidemia: Secondary | ICD-10-CM | POA: Insufficient documentation

## 2018-12-30 DIAGNOSIS — Z716 Tobacco abuse counseling: Secondary | ICD-10-CM | POA: Insufficient documentation

## 2018-12-30 DIAGNOSIS — R2242 Localized swelling, mass and lump, left lower limb: Secondary | ICD-10-CM | POA: Insufficient documentation

## 2018-12-30 DIAGNOSIS — I2510 Atherosclerotic heart disease of native coronary artery without angina pectoris: Secondary | ICD-10-CM | POA: Insufficient documentation

## 2018-12-30 DIAGNOSIS — K219 Gastro-esophageal reflux disease without esophagitis: Secondary | ICD-10-CM | POA: Insufficient documentation

## 2018-12-30 DIAGNOSIS — Z79899 Other long term (current) drug therapy: Secondary | ICD-10-CM | POA: Insufficient documentation

## 2018-12-30 MED ORDER — CLONAZEPAM 1 MG TABLET
ORAL_TABLET | ORAL | 5 refills | Status: DC
Start: 2018-12-30 — End: 2019-05-31

## 2018-12-30 NOTE — Progress Notes (Signed)
[name]  [place]  [place] [address]  [phone]   Progress Note    Date of Service:  12/30/2018  [name] [name], [name], 63 y.o. female  Date of Birth:  1955-06-07  PCP: [name] Loa, MD    Chief Complaint:  Follow-up (4 month)       HPI:  [name] [name] [name] is a 63 y.o. White female who is seen in the [facility] for routine follow-up.  Patient is here for routine follow-up and is done relatively well.  She has seen Dr. Selinda [name] last month at least his assistant and everything was going well.  The patient's medications have been unchanged and she is tolerating her treatment for her congestive heart failure heart disease from her stenting as well as her hyperlipidemia without any change in medication.  Her Synthroid dose is unchanged and her last TSH is stable.  The patient is taking her clonazepam for her anxiety and seems to tolerate well.  She has not had any significant palpitations PND orthopnea.  Patient is taking her congestive heart failure medicine without any issues.  Patient's stomach doing well with reflux medication and she has not had any significant swelling in her ankles currently.  The patient's shoulder is doing much better today and she is only complaining about a new place that caused him up recently.  She did go the urgent care was seen for a mass her knot on her left anterior thigh.  She was sent to hospital on her ultrasound felt refill any significant problems except for some mild edema but no masses were noted.  The patient is having no pain in this area no claudication no redness no warmth and it has not gotten any larger.    Patient's medications were reconciled and current.    Patient's review of system is negative except for positives listed in HPI.    Past Medical History:   Diagnosis Date   . CAD (coronary artery disease) 05/05/2018   . CHF (congestive heart failure) (CMS HCC) 05/05/2018   . COPD  (chronic obstructive pulmonary disease) (CMS [place]) 05/05/2018   . GAD (generalized anxiety disorder) 05/05/2018   . GERD (gastroesophageal reflux disease) 05/05/2018   . History of heart artery stent 05/05/2018   . Hyperlipidemia 05/05/2018   . Hypertension 05/05/2018   . Hypothyroidism 05/05/2018   . Nicotine dependence, cigarettes, uncomplicated [id number]   . Right shoulder pain 05/05/2018      Past Surgical History:   Procedure Laterality Date   . CORONARY ARTERY ANGIOPLASTY     . HX APPENDECTOMY     . HX CESAREAN SECTION     . HX HEART CATHETERIZATION     . LEG SURGERY Right       Social History     Tobacco Use   . Smoking status: Current Every Day Smoker   . Smokeless tobacco: Never Used   . Tobacco comment: one pack daily since age 51   Substance Use Topics   . Alcohol use: Yes     Comment: occasional   . Drug use: Not on file       Family Medical History:     Problem Relation (Age of Onset)    Cancer Other    Diabetes Other    Lymphoma Other         Outpatient Medications Marked as Taking for the 12/30/18 encounter (Office Visit) with [name] Loa,  MD   Medication Sig   . atorvastatin (LIPITOR) 40 mg Oral Tablet TAKE 1 TABLET BY MOUTH ONCE DAILY FOR 90 DAYS   . clonazePAM (KLONOPIN) 1 mg Oral Tablet Take 1 tablet by mouth twice daily   . furosemide (LASIX) 20 mg Oral Tablet Take 20 mg by mouth Once a day as needed.   Marland Kitchen levothyroxine (SYNTHROID) 150 mcg Oral Tablet TAKE 1 TABLET BY MOUTH IN THE MORNING   . lisinopril (PRINIVIL) 10 mg Oral Tablet TAKE 1 TABLET BY MOUTH ONCE DAILY FOR 90 DAYS   . pantoprazole (PROTONIX) 40 mg Oral Tablet, Delayed Release (E.C.) Take 1 tablet by mouth once daily      Allergies   Allergen Reactions   . Sulfa (Sulfonamides)         REVIEW OF SYSTEMS:  GENERAL:  Positive for fatigue, without significant weight change, sleep disturbance  HEENT: No recent visual or hearing changes.   RESPIRATORY: No increase SOB, cough, wheezes  CARDIAC: No current chest pains; no palpitations; no  edema  GI: no N/V/D, no abdominal pain.  BMs regular  MUSCULOSKELETAL:  Complains of swollen right anterior thigh, no myalgias, no joint pain, no edema or erythema  NEUROLOGIC: no headache, weakness, numbness or tingling  GU: no urinary sxs or flank pain  SKIN:  negative for rashes.    PHYSICAL EXAM:   The patient appears to be in no acute distress.  Vitals: BP 122/83   Pulse 86   Temp 36.2 C (97.1 F)   Resp 18   Ht 1.727 m (5\' 8" )   Wt 89.8 kg (198 lb)   SpO2 97%   BMI 30.11 kg/m       HEENT:   Head Normocephalic. No masses, lesions, tenderness or abnormalities  Eyes: PERRLA, EOMI, conjunctiva wnl.    Ears: Bilateral TMs intact and clear.  Nose: Bilateral nares patent  Throat: Pharynx clear. No lymphadenopathy or thyromegaly.  RESPIRATORY: CTA moderately decreased without rales rhonchi or wheezes, no resp distress, equal BS  HEART: Regular rate and rhythm without ectopy or pauses, no murmur, no edema, pedal pulses 2+  GI:  BS present in 4 quadrants.  No tenderness, masses, organomegaly.  Abdomen soft.  Markedly obese without rebound  EXTREMITIES:  Soft tissue mass on the anterior thigh just above the knee of 4 x 4 cm consistent with a lipoma, no edema, erythema or tenderness  NEURO: AAOx3, CN's intact, DTR's 2/4 throughout, Romberg not tested    Data Review:  Pertinent laboratory data and imaging studies reviewed.      Assessment/Plan:  1. Benign lipomatous neoplasm of skin and subcutaneous tissue of right leg    2. Chronic obstructive pulmonary disease, unspecified COPD type (CMS HCC)    3. Essential hypertension    4. Mixed hyperlipidemia    5. Coronary artery disease, angina presence unspecified, unspecified vessel or lesion type, unspecified whether native or transplanted heart    6. Congestive heart failure, unspecified HF chronicity, unspecified heart failure type (CMS HCC)    7. Gastroesophageal reflux disease, esophagitis presence not specified    8. Hypothyroidism, unspecified type    9. GAD  (generalized anxiety disorder)    10. History of heart artery stent    11. Nicotine dependence, cigarettes, uncomplicated         Return in about 4 months (around 05/01/2019).      Patient is doing well.  Patient is taking her Coreg for her heart disease as well as her  CHF.  She continues her lisinopril will long with that and her furosemide for her swelling.  She has had no acute congestive heart failure and she has had no current cardiac symptoms with chest pain.  She does have a nitroglycerin but has not required it lately.  She takes her pantoprazole for reflux which has worked well.  Her COPD is about the same but she does continue to smoke.  She does not require any inhaler or nebulizer at this time.  Her thyroid doses are adjusted according to her TSH and that was adjusted last time.  Patient takes her clonazepam twice daily for her anxiety and a refill was needed on that today.  The patient is having no significant adverse effects with that.  The patient does have a mass on her right anterior thigh consistent with lipoma about 4 x 4 cm. It was recommended to observe unless he gets bigger becomes problematic.  The patient will otherwise continue all current treatments and will have a lipid profile as well as a TSH to see if any further adjustments are needed in her medicines.  Patient otherwise return to office in 4 months unless she has further problems then she will return sooner.    Patient advised on the adverse effects of tobacco.  Patient was instructed of the benefits of quitting.  Patient was provided the opportunity to be referred to tobacco dependence groups if patient would like and patient was also offered nicotine replacement products.  I have spent 5 minutes counseling the patient.    This is an established Patient. Pt has been seen in last 3 years.   [name] Loa, MD

## 2019-01-04 ENCOUNTER — Other Ambulatory Visit (RURAL_HEALTH_CENTER): Payer: Self-pay | Admitting: Family Medicine

## 2019-01-05 ENCOUNTER — Other Ambulatory Visit (RURAL_HEALTH_CENTER): Payer: Self-pay | Admitting: Family Medicine

## 2019-01-05 MED ORDER — ATORVASTATIN 40 MG TABLET
ORAL_TABLET | ORAL | 3 refills | Status: AC
Start: 2019-01-05 — End: ?

## 2019-02-25 ENCOUNTER — Other Ambulatory Visit (RURAL_HEALTH_CENTER): Payer: Self-pay | Admitting: Family Medicine

## 2019-02-25 DIAGNOSIS — I509 Heart failure, unspecified: Secondary | ICD-10-CM

## 2019-02-25 DIAGNOSIS — I10 Essential (primary) hypertension: Secondary | ICD-10-CM

## 2019-03-02 ENCOUNTER — Encounter (RURAL_HEALTH_CENTER): Payer: Self-pay | Admitting: Family Medicine

## 2019-03-04 ENCOUNTER — Encounter (RURAL_HEALTH_CENTER): Payer: Self-pay | Admitting: Family Medicine

## 2019-03-25 ENCOUNTER — Other Ambulatory Visit (RURAL_HEALTH_CENTER): Payer: Self-pay | Admitting: Family Medicine

## 2019-03-25 ENCOUNTER — Other Ambulatory Visit (RURAL_HEALTH_CENTER): Payer: Self-pay | Admitting: Physician Assistant

## 2019-03-25 DIAGNOSIS — K219 Gastro-esophageal reflux disease without esophagitis: Secondary | ICD-10-CM

## 2019-03-25 DIAGNOSIS — I10 Essential (primary) hypertension: Secondary | ICD-10-CM

## 2019-03-25 DIAGNOSIS — E039 Hypothyroidism, unspecified: Secondary | ICD-10-CM

## 2019-03-25 DIAGNOSIS — I509 Heart failure, unspecified: Secondary | ICD-10-CM

## 2019-04-29 ENCOUNTER — Encounter (RURAL_HEALTH_CENTER): Payer: Self-pay | Admitting: Family Medicine

## 2019-05-02 ENCOUNTER — Other Ambulatory Visit: Payer: Self-pay

## 2019-05-02 ENCOUNTER — Ambulatory Visit: Payer: Medicare Other | Attending: Family Medicine

## 2019-05-02 DIAGNOSIS — E039 Hypothyroidism, unspecified: Secondary | ICD-10-CM | POA: Insufficient documentation

## 2019-05-02 DIAGNOSIS — E782 Mixed hyperlipidemia: Secondary | ICD-10-CM | POA: Insufficient documentation

## 2019-05-02 LAB — LIPID PANEL
CHOLESTEROL: 169 mg/dL (ref 0–199)
HDL CHOL: 38 mg/dL — ABNORMAL LOW (ref 40–59)
LDL CALC: 103 mg/dL — ABNORMAL HIGH (ref 0–99)
TRIGLYCERIDES: 142 mg/dL (ref 0–149)
VLDL CALC: 28 mg/dL (ref 0–50)

## 2019-05-02 LAB — THYROID STIMULATING HORMONE (SENSITIVE TSH): TSH: <0.015 u[IU]/mL — ABNORMAL LOW (ref 0.470–4.680)

## 2019-05-03 ENCOUNTER — Encounter (RURAL_HEALTH_CENTER): Payer: Self-pay | Admitting: Family Medicine

## 2019-05-04 ENCOUNTER — Encounter (RURAL_HEALTH_CENTER): Payer: Self-pay | Admitting: Family Medicine

## 2019-05-04 ENCOUNTER — Other Ambulatory Visit (RURAL_HEALTH_CENTER): Payer: Self-pay | Admitting: Family Medicine

## 2019-05-04 DIAGNOSIS — E039 Hypothyroidism, unspecified: Secondary | ICD-10-CM

## 2019-05-04 MED ORDER — LEVOTHYROXINE 125 MCG TABLET
125.00 ug | ORAL_TABLET | Freq: Every morning | ORAL | 4 refills | Status: DC
Start: 2019-05-04 — End: 2020-01-10

## 2019-05-17 ENCOUNTER — Encounter (RURAL_HEALTH_CENTER): Payer: Self-pay | Admitting: Family Medicine

## 2019-05-31 ENCOUNTER — Encounter (RURAL_HEALTH_CENTER): Payer: Self-pay | Admitting: Family Medicine

## 2019-05-31 ENCOUNTER — Other Ambulatory Visit: Payer: Self-pay

## 2019-05-31 ENCOUNTER — Ambulatory Visit: Payer: Medicare Other | Attending: Family Medicine | Admitting: Family Medicine

## 2019-05-31 VITALS — BP 113/73 | HR 72 | Temp 97.1°F | Resp 18 | Ht 68.0 in | Wt 204.7 lb

## 2019-05-31 DIAGNOSIS — F17210 Nicotine dependence, cigarettes, uncomplicated: Secondary | ICD-10-CM | POA: Insufficient documentation

## 2019-05-31 DIAGNOSIS — Z955 Presence of coronary angioplasty implant and graft: Secondary | ICD-10-CM | POA: Insufficient documentation

## 2019-05-31 DIAGNOSIS — E039 Hypothyroidism, unspecified: Secondary | ICD-10-CM | POA: Insufficient documentation

## 2019-05-31 DIAGNOSIS — I110 Hypertensive heart disease with heart failure: Secondary | ICD-10-CM | POA: Insufficient documentation

## 2019-05-31 DIAGNOSIS — F411 Generalized anxiety disorder: Secondary | ICD-10-CM

## 2019-05-31 DIAGNOSIS — I2510 Atherosclerotic heart disease of native coronary artery without angina pectoris: Secondary | ICD-10-CM | POA: Insufficient documentation

## 2019-05-31 DIAGNOSIS — M79645 Pain in left finger(s): Secondary | ICD-10-CM

## 2019-05-31 DIAGNOSIS — K219 Gastro-esophageal reflux disease without esophagitis: Secondary | ICD-10-CM | POA: Insufficient documentation

## 2019-05-31 DIAGNOSIS — M79644 Pain in right finger(s): Secondary | ICD-10-CM | POA: Insufficient documentation

## 2019-05-31 DIAGNOSIS — E782 Mixed hyperlipidemia: Secondary | ICD-10-CM

## 2019-05-31 DIAGNOSIS — I10 Essential (primary) hypertension: Secondary | ICD-10-CM

## 2019-05-31 DIAGNOSIS — I509 Heart failure, unspecified: Secondary | ICD-10-CM | POA: Insufficient documentation

## 2019-05-31 DIAGNOSIS — M545 Low back pain: Secondary | ICD-10-CM | POA: Insufficient documentation

## 2019-05-31 DIAGNOSIS — J449 Chronic obstructive pulmonary disease, unspecified: Secondary | ICD-10-CM | POA: Insufficient documentation

## 2019-05-31 MED ORDER — CLONAZEPAM 1 MG TABLET
ORAL_TABLET | ORAL | 2 refills | Status: DC
Start: 2019-05-31 — End: 2019-09-06

## 2019-05-31 MED ORDER — ATORVASTATIN 40 MG TABLET
ORAL_TABLET | ORAL | 3 refills | Status: DC
Start: 2019-05-31 — End: 2020-05-17

## 2019-05-31 MED ORDER — CLONAZEPAM 1 MG TABLET
1.00 mg | ORAL_TABLET | Freq: Three times a day (TID) | ORAL | 3 refills | Status: DC
Start: 2019-05-31 — End: 2019-05-31

## 2019-05-31 MED ORDER — NAPROXEN 500 MG TABLET
500.00 mg | ORAL_TABLET | Freq: Two times a day (BID) | ORAL | 0 refills | Status: DC
Start: 2019-05-31 — End: 2019-12-30

## 2019-05-31 NOTE — Progress Notes (Signed)
[facility]  [place]  [place] [address]  [phone]   Progress Note    Date of Service:  05/31/2019  [name] [name], [name] [name], 64 y.o. female  Date of Birth:  Feb 19, 1956  PCP: Betha Loa, MD    Chief Complaint:  Follow-up (4 month f/u)       HPI:  [name] [name] is a 64 y.o. White female who is seen in the [facility] for routine follow-up.  Patient is here for 4 month follow-up and is needing refill on her nerve medicine and is wanting to increase it.  The patient additionally is having some issues with some bilateral thumb pain.  She has never been diagnosed with carpal tunnel but was concerned that maybe that is the problem.  She does not really have numbness or tingling she just has pain in the MP joints bilaterally.  She did have some pain that shot into her 4th finger on her left hand when she lifted something other day.  The patient is having no significant weakness in there she is having no pain that wakes her up at night and she does not have any hot red swollen joints.  Patient is also complaining of chronic low back pain.  The patient states that her anxiety is gotten little bit worse and she had previously taken the clonazepam 1 mg 3 times a day.  She is requesting an increase in that.  The patient's COPD is been about the same she has not had any significant shortness of breath or cough is gotten worse.  She continues to smoke the same however.  Patient's hyperlipidemia and hypertension her on the same controlled.  She has had no recurrent chest pain and no evidence of any coronary symptoms from her stent placement.  Patient's CHF is been stable as well.  Patient's reflux is current on her stable medication and has not had any exacerbations.  Her hypothyroid is stable as well.  The patient is having no significant bowel or bladder dysfunction.  She has no radicular symptoms with her chronic back  pain.    Patient's medications were reconciled and current    Past Medical History:   Diagnosis Date   . CAD (coronary artery disease) 05/05/2018   . CHF (congestive heart failure) (CMS HCC) 05/05/2018   . COPD (chronic obstructive pulmonary disease) (CMS [place]) 05/05/2018   . GAD (generalized anxiety disorder) 05/05/2018   . GERD (gastroesophageal reflux disease) 05/05/2018   . History of heart artery stent 05/05/2018   . Hyperlipidemia 05/05/2018   . Hypertension 05/05/2018   . Hypothyroidism 05/05/2018   . Nicotine dependence, cigarettes, uncomplicated [id number]   . Right shoulder pain 05/05/2018      Past Surgical History:   Procedure Laterality Date   . CORONARY ARTERY ANGIOPLASTY     . HX APPENDECTOMY     . HX CESAREAN SECTION     . HX HEART CATHETERIZATION     . LEG SURGERY Right       Social History     Tobacco Use   . Smoking status: Current Every Day Smoker   . Smokeless tobacco: Never Used   . Tobacco comment: one pack daily since age 52   Substance Use Topics   . Alcohol use: Yes     Comment: occasional   . Drug use: Not on file       Family Medical History:  Problem Relation (Age of Onset)    Cancer Other    Diabetes Other    Lymphoma Other         Outpatient Medications Marked as Taking for the 05/31/19 encounter (Office Visit) with Betha Loa, MD   Medication Sig   . atorvastatin (LIPITOR) 40 mg Oral Tablet Take 1 tablet by mouth once daily   . carvediloL (COREG) 6.25 mg Oral Tablet Take 1 tablet by mouth twice daily with food   . clonazePAM (KLONOPIN) 1 mg Oral Tablet Take 1 tablet by mouth 3 times daily   . furosemide (LASIX) 20 mg Oral Tablet Take 20 mg by mouth Once a day as needed.   Marland Kitchen levothyroxine (SYNTHROID) 125 mcg Oral Tablet Take 1 Tab (125 mcg total) by mouth Every morning   . lisinopriL (PRINIVIL) 10 mg Oral Tablet Take 1 tablet by mouth once daily for 90 days   . naproxen (NAPROSYN) 500 mg Oral Tablet Take 1 Tablet (500 mg total) by mouth Twice daily with food   . pantoprazole (PROTONIX) 40  mg Oral Tablet, Delayed Release (E.C.) Take 1 tablet by mouth once daily      Allergies   Allergen Reactions   . Sulfa (Sulfonamides)         REVIEW OF SYSTEMS:  GENERAL:  Positive for fatigue, without significant weight change, sleep disturbance  HEENT: No recent visual or hearing changes.   RESPIRATORY: No SOB, cough, wheezes  CARDIAC: No chest pains; no palpitations; no edema  GI: no N/V/D, no abdominal pain.  BMs regular  MUSCULOSKELETAL: no myalgias, no joint pain, no edema or erythema  NEUROLOGIC: no headache, weakness, numbness or tingling  GU: no urinary sxs or flank pain  SKIN:  negative for rashes.    PHYSICAL EXAM:   The patient appears to be in no acute distress.  Vitals: BP 113/73   Pulse 72   Temp 36.2 C (97.1 F)   Resp 18   Ht 1.727 m (5\' 8" )   Wt 92.9 kg (204 lb 11.2 oz)   SpO2 98%   BMI 31.12 kg/m       HEENT:   Head Normocephalic. No masses, lesions, tenderness or abnormalities  Eyes: PERRLA, EOMI, conjunctiva wnl.    Ears: Bilateral TMs intact and clear.  Nose: Bilateral nares patent  Throat: Pharynx clear. No lymphadenopathy or thyromegaly.  RESPIRATORY: CTA without rales rhonchi wheezes, no resp distress, equal BS  HEART: Regular rate and rhythm without ectopy or pauses, no murmur, no edema, pedal pulses 2+  GI:  BS present in 4 quadrants.  No tenderness, masses, organomegaly.  Abdomen soft.  EXTREMITIES:  Tender MP joints bilaterally on the 1st digits without any obvious deformity, no edema, erythema or tenderness  NEURO: AAOx3, CN's intact, DTR's 2/4 throughout, Romberg not tested    Data Review:  Pertinent laboratory data and imaging studies reviewed.      Assessment/Plan:  1. Coronary artery disease, angina presence unspecified, unspecified vessel or lesion type, unspecified whether native or transplanted heart    2. Chronic obstructive pulmonary disease, unspecified COPD type (CMS HCC)    3. Congestive heart failure, unspecified HF chronicity, unspecified heart failure type (CMS  HCC)    4. History of heart artery stent    5. Mixed hyperlipidemia    6. Essential hypertension    7. Gastroesophageal reflux disease, unspecified whether esophagitis present    8. Hypothyroidism, unspecified type    9. GAD (generalized anxiety disorder)  10. Bilateral thumb pain         Return in about 3 months (around 08/28/2019).      Patient this point is relatively stable.  She is tender over MP joints on bilateral thumbs but she does not really have any redness and there is no deformity otherwise.  She does not have any carpal tunnel findings otherwise and no real symptom of numbness or tingling consistent with carpal tunnel.  The patient is currently taking her medicines with her clonazepam 1 mg twice daily but we will go ahead and increase it to 3 times a day to see if that will help some.  Will put on naproxen 500 mg twice daily for her arthritis with explanation that will not going to continue this long term since she does have coronary disease and is not recommended to be on long-term use of nonsteroidals.  She will continue her pantoprazole 40 mg daily for her reflux.  Patient is also taking her Prinivil 10 mg daily for blood pressure along with her Lasix and her Coreg.  The patient will continue with her Synthroid 125 mcg and will otherwise continue all treatment.  The patient will be considered for something like Cymbalta if she continues to have her low back pain this point but will just work on trying to get her fingers straightened up.  The patient will otherwise return to office in 3 months for follow-up unless she has further problems and will be due for blood work at that time.  Patient will return sooner if problems arise.    This is an established Patient. Pt has been seen in last 3 years.   Betha Loa, MD

## 2019-06-11 ENCOUNTER — Other Ambulatory Visit (RURAL_HEALTH_CENTER): Payer: Self-pay | Admitting: Family Medicine

## 2019-06-11 DIAGNOSIS — K219 Gastro-esophageal reflux disease without esophagitis: Secondary | ICD-10-CM

## 2019-06-30 ENCOUNTER — Other Ambulatory Visit (RURAL_HEALTH_CENTER): Payer: Self-pay | Admitting: Family Medicine

## 2019-06-30 DIAGNOSIS — I2510 Atherosclerotic heart disease of native coronary artery without angina pectoris: Secondary | ICD-10-CM

## 2019-06-30 DIAGNOSIS — Z955 Presence of coronary angioplasty implant and graft: Secondary | ICD-10-CM

## 2019-08-23 ENCOUNTER — Ambulatory Visit: Payer: Medicare (Managed Care) | Attending: Family Medicine | Admitting: Family Medicine

## 2019-08-23 ENCOUNTER — Encounter (RURAL_HEALTH_CENTER): Payer: Self-pay | Admitting: Family Medicine

## 2019-08-23 ENCOUNTER — Other Ambulatory Visit: Payer: Self-pay

## 2019-08-23 VITALS — BP 135/86 | HR 80 | Temp 97.1°F | Resp 18 | Ht 68.0 in | Wt 202.0 lb

## 2019-08-23 DIAGNOSIS — F17210 Nicotine dependence, cigarettes, uncomplicated: Secondary | ICD-10-CM

## 2019-08-23 DIAGNOSIS — I10 Essential (primary) hypertension: Secondary | ICD-10-CM

## 2019-08-23 DIAGNOSIS — M159 Polyosteoarthritis, unspecified: Secondary | ICD-10-CM | POA: Insufficient documentation

## 2019-08-23 DIAGNOSIS — M79604 Pain in right leg: Secondary | ICD-10-CM | POA: Insufficient documentation

## 2019-08-23 DIAGNOSIS — I110 Hypertensive heart disease with heart failure: Secondary | ICD-10-CM | POA: Insufficient documentation

## 2019-08-23 DIAGNOSIS — F411 Generalized anxiety disorder: Secondary | ICD-10-CM | POA: Insufficient documentation

## 2019-08-23 DIAGNOSIS — K219 Gastro-esophageal reflux disease without esophagitis: Secondary | ICD-10-CM | POA: Insufficient documentation

## 2019-08-23 DIAGNOSIS — I509 Heart failure, unspecified: Secondary | ICD-10-CM | POA: Insufficient documentation

## 2019-08-23 DIAGNOSIS — M79645 Pain in left finger(s): Secondary | ICD-10-CM | POA: Insufficient documentation

## 2019-08-23 DIAGNOSIS — Z791 Long term (current) use of non-steroidal anti-inflammatories (NSAID): Secondary | ICD-10-CM | POA: Insufficient documentation

## 2019-08-23 DIAGNOSIS — J449 Chronic obstructive pulmonary disease, unspecified: Secondary | ICD-10-CM | POA: Insufficient documentation

## 2019-08-23 DIAGNOSIS — Z79899 Other long term (current) drug therapy: Secondary | ICD-10-CM | POA: Insufficient documentation

## 2019-08-23 DIAGNOSIS — Z955 Presence of coronary angioplasty implant and graft: Secondary | ICD-10-CM | POA: Insufficient documentation

## 2019-08-23 DIAGNOSIS — E782 Mixed hyperlipidemia: Secondary | ICD-10-CM | POA: Insufficient documentation

## 2019-08-23 DIAGNOSIS — Z1211 Encounter for screening for malignant neoplasm of colon: Secondary | ICD-10-CM | POA: Insufficient documentation

## 2019-08-23 DIAGNOSIS — G8929 Other chronic pain: Secondary | ICD-10-CM | POA: Insufficient documentation

## 2019-08-23 DIAGNOSIS — M79644 Pain in right finger(s): Secondary | ICD-10-CM | POA: Insufficient documentation

## 2019-08-23 DIAGNOSIS — I2510 Atherosclerotic heart disease of native coronary artery without angina pectoris: Secondary | ICD-10-CM | POA: Insufficient documentation

## 2019-08-23 DIAGNOSIS — E039 Hypothyroidism, unspecified: Secondary | ICD-10-CM

## 2019-08-23 NOTE — Progress Notes (Signed)
[facility]  [place]  [place] [address]  [phone]   Progress Note    Date of Service:  08/23/2019  [name], [name], 64 y.o. female  Date of Birth:  12-19-1955  PCP: Betha Loa, MD    Chief Complaint:  Follow-up (3 month f/u)       HPI:  [name] [name] is a 64 y.o. White female who is seen in the [facility] for routine follow-up and medication refill.  Patient has been doing fair.  She did see cardiology and they made no recommendations for any significant changes.  Patient denies any congestive heart failure symptoms currently and she is not having any current chest pain or palpitations.  She does have a history of the heart stent and is stable at this time without any current angina.  Her hyperlipidemia is current on her treatment hypertension is been moderately well controlled recently.  Patient's reflux is not been flared up recently and she has not had any indigestion.  Her anxieties about the same and she still having lot of pain in her thumb and her fingers particularly.  She is has some developing swelling areas over her PIP joints on several fingers.  The patient's shoulder pains about the same although she has not had any acute exacerbation.  Her thyroid remains stable on her current treatment.    Patient's medications were reconciled and current.    Past Medical History:   Diagnosis Date   . CAD (coronary artery disease) 05/05/2018   . CHF (congestive heart failure) (CMS HCC) 05/05/2018   . COPD (chronic obstructive pulmonary disease) (CMS [place]) 05/05/2018   . GAD (generalized anxiety disorder) 05/05/2018   . GERD (gastroesophageal reflux disease) 05/05/2018   . History of heart artery stent 05/05/2018   . Hyperlipidemia 05/05/2018   . Hypertension 05/05/2018   . Hypothyroidism 05/05/2018   . Nicotine dependence, cigarettes, uncomplicated [id number]   . Right shoulder pain 05/05/2018      Past Surgical History:    Procedure Laterality Date   . CORONARY ARTERY ANGIOPLASTY     . HX APPENDECTOMY     . HX CESAREAN SECTION     . HX HEART CATHETERIZATION     . LEG SURGERY Right       Social History     Tobacco Use   . Smoking status: Current Every Day Smoker   . Smokeless tobacco: Never Used   . Tobacco comment: one pack daily since age 68   Substance Use Topics   . Alcohol use: Yes     Comment: occasional   . Drug use: Not on file       Family Medical History:     Problem Relation (Age of Onset)    Cancer Other    Diabetes Other    Lymphoma Other         Outpatient Medications Marked as Taking for the 08/23/19 encounter (Office Visit) with Betha Loa, MD   Medication Sig   . atorvastatin (LIPITOR) 40 mg Oral Tablet Take 1 tablet by mouth once daily   . carvediloL (COREG) 6.25 mg Oral Tablet Take 1 tablet by mouth twice daily with food   . clonazePAM (KLONOPIN) 1 mg Oral Tablet Take 1 tablet by mouth 3 times daily   . furosemide (LASIX) 20 mg Oral Tablet Take 20 mg by mouth Once a day as needed.   Marland Kitchen levothyroxine (SYNTHROID)  125 mcg Oral Tablet Take 1 Tab (125 mcg total) by mouth Every morning   . lisinopriL (PRINIVIL) 10 mg Oral Tablet Take 1 tablet by mouth once daily for 90 days   . naproxen (NAPROSYN) 500 mg Oral Tablet Take 1 Tablet (500 mg total) by mouth Twice daily with food   . nitroGLYCERIN (NITROSTAT) 0.4 mg Sublingual Tablet, Sublingual DISSOLVE ONE TABLET UNDER THE TONGUE EVERY 5 MINUTES AS NEEDED FOR CHEST PAIN.  DO NOT EXCEED A TOTAL OF 3 DOSES IN 15 MINUTES   . pantoprazole (PROTONIX) 40 mg Oral Tablet, Delayed Release (E.C.) Take 1 tablet by mouth once daily      Allergies   Allergen Reactions   . Sulfa (Sulfonamides)        s  REVIEW OF SYSTEMS:  GENERAL:  Positive for fatigue, without significant weight change, sleep disturbance  HEENT: No recent visual or hearing changes.   RESPIRATORY: No recent SOB, cough, wheezes  CARDIAC: No current chest pains; no palpitations; no edema  GI: no N/V/D, no abdominal  pain.  BMs regular  MUSCULOSKELETAL: no myalgias, complains of bilateral hand joint pain, no edema or erythema  NEUROLOGIC: no headache, weakness, numbness or tingling  GU: no urinary sxs or flank pain  SKIN:  negative for rashes.    PHYSICAL EXAM:   The patient appears to be in no acute distress.  Vitals: BP 135/86   Pulse 80   Temp 36.2 C (97.1 F)   Resp 18   Ht 1.727 m (5\' 8" )   Wt 91.6 kg (202 lb)   SpO2 96%   BMI 30.71 kg/m       HEENT:   Head Normocephalic. No masses, lesions, tenderness or abnormalities  Eyes: PERRLA, EOMI, conjunctiva wnl.    Ears: Bilateral TMs intact and clear.  Nose: Bilateral nares patent  Throat: Pharynx clear. No lymphadenopathy or thyromegaly.  RESPIRATORY: CTA without rales rhonchi or wheezes, no resp distress, equal BS  HEART: Regular rate and rhythm without ectopy or pauses, no murmur, no edema, pedal pulses 2+  GI:  BS present in 4 quadrants.  No tenderness, masses, organomegaly.  Abdomen soft.  Moderately to markedly obese without rebound  EXTREMITIES:  Heberden's nodes noted on PIP of several of the fingers bilaterally, no edema, erythema or tenderness  NEURO: AAOx3, CN's intact, DTR's 2/4 throughout, Romberg not tested    Data Review:  Pertinent laboratory data and imaging studies reviewed.      Assessment/Plan:  1. Coronary artery disease, angina presence unspecified, unspecified vessel or lesion type, unspecified whether native or transplanted heart    2. Congestive heart failure, unspecified HF chronicity, unspecified heart failure type (CMS HCC)    3. Essential hypertension    4. Mixed hyperlipidemia    5. History of heart artery stent    6. Chronic obstructive pulmonary disease, unspecified COPD type (CMS HCC)    7. Nicotine dependence, cigarettes, uncomplicated    8. Gastroesophageal reflux disease, unspecified whether esophagitis present    9. Hypothyroidism, unspecified type    10. GAD (generalized anxiety disorder)    11. Bilateral thumb pain    12. Right leg  pain    13. Colon cancer screening    14. Generalized osteoarthritis of hand         Return in about 4 months (around 12/24/2019).      Patient this point is stable.  She is complaining of her right lower leg pain where she had had previous surgery  for fracture.  She will have an x-ray of her tib-fib on that right side to see if there is any changes in the hardware.  The patient is taking her Protonix for her reflux at this point which has worked well.  She does take her clonazepam 1 mg 3 times daily which she has taken for years for her anxiety.  She is on her atorvastatin 40 mg daily for cholesterol and has not had any significant myalgias.  She takes her thyroid 125 mcg her last TSH was normal.  Currently the patient is taking lisinopril 10 mg along with the furosemide 20 mg and carvedilol 6.25 mg for her heart congestive failure as well as her blood pressure.  The patient is doing reasonably well her renal function stayed stable.  She does take naproxen 500 mg for her chronic pains and arthritis.  She does have Heberden's nodes consistent with generalized osteoarthritis of the hands.  The patient will continue all current treatment.  She was given a slip to have blood work done prior to return which will include a TSH a lipid profile comprehensive metabolic and CBC.  She will have the tib-fib x-ray where she had previously had a right leg injury.  Lastly the patient has not had a colonoscopy.  She will be referred to surgery for colonoscopy and she will return to office in 4 months unless she has further problems then she would return sooner.    This is an established Patient. Pt has been seen in last 3 years.   Betha Loa, MD

## 2019-08-28 ENCOUNTER — Other Ambulatory Visit (RURAL_HEALTH_CENTER): Payer: Self-pay | Admitting: Family Medicine

## 2019-08-28 DIAGNOSIS — I10 Essential (primary) hypertension: Secondary | ICD-10-CM

## 2019-08-28 DIAGNOSIS — K219 Gastro-esophageal reflux disease without esophagitis: Secondary | ICD-10-CM

## 2019-08-29 ENCOUNTER — Ambulatory Visit (INDEPENDENT_AMBULATORY_CARE_PROVIDER_SITE_OTHER): Payer: Self-pay | Admitting: Surgery

## 2019-09-06 ENCOUNTER — Other Ambulatory Visit (RURAL_HEALTH_CENTER): Payer: Self-pay | Admitting: Family Medicine

## 2019-09-06 DIAGNOSIS — F411 Generalized anxiety disorder: Secondary | ICD-10-CM

## 2019-10-07 ENCOUNTER — Other Ambulatory Visit (RURAL_HEALTH_CENTER): Payer: Self-pay | Admitting: Family Medicine

## 2019-10-07 DIAGNOSIS — F411 Generalized anxiety disorder: Secondary | ICD-10-CM

## 2019-10-27 ENCOUNTER — Other Ambulatory Visit (RURAL_HEALTH_CENTER): Payer: Self-pay | Admitting: Family Medicine

## 2019-10-27 DIAGNOSIS — I10 Essential (primary) hypertension: Secondary | ICD-10-CM

## 2019-10-27 DIAGNOSIS — I509 Heart failure, unspecified: Secondary | ICD-10-CM

## 2019-11-01 ENCOUNTER — Other Ambulatory Visit (RURAL_HEALTH_CENTER): Payer: Self-pay | Admitting: Family Medicine

## 2019-11-01 DIAGNOSIS — F411 Generalized anxiety disorder: Secondary | ICD-10-CM

## 2019-11-08 ENCOUNTER — Other Ambulatory Visit (RURAL_HEALTH_CENTER): Payer: Self-pay | Admitting: Internal Medicine

## 2019-11-08 ENCOUNTER — Other Ambulatory Visit (RURAL_HEALTH_CENTER): Payer: Self-pay | Admitting: Family Medicine

## 2019-11-08 DIAGNOSIS — F411 Generalized anxiety disorder: Secondary | ICD-10-CM

## 2019-11-08 DIAGNOSIS — I10 Essential (primary) hypertension: Secondary | ICD-10-CM

## 2019-11-08 DIAGNOSIS — K219 Gastro-esophageal reflux disease without esophagitis: Secondary | ICD-10-CM

## 2019-12-02 ENCOUNTER — Other Ambulatory Visit (RURAL_HEALTH_CENTER): Payer: Self-pay | Admitting: Internal Medicine

## 2019-12-02 DIAGNOSIS — F411 Generalized anxiety disorder: Secondary | ICD-10-CM

## 2019-12-23 ENCOUNTER — Encounter (RURAL_HEALTH_CENTER): Payer: Self-pay | Admitting: Family Medicine

## 2019-12-30 ENCOUNTER — Other Ambulatory Visit: Payer: Self-pay

## 2019-12-30 ENCOUNTER — Ambulatory Visit: Payer: Medicare (Managed Care) | Attending: Family Medicine | Admitting: Family Medicine

## 2019-12-30 ENCOUNTER — Encounter (RURAL_HEALTH_CENTER): Payer: Self-pay | Admitting: Family Medicine

## 2019-12-30 VITALS — BP 113/79 | HR 82 | Temp 96.6°F | Resp 16 | Ht 68.0 in | Wt 202.0 lb

## 2019-12-30 DIAGNOSIS — K219 Gastro-esophageal reflux disease without esophagitis: Secondary | ICD-10-CM

## 2019-12-30 DIAGNOSIS — R252 Cramp and spasm: Secondary | ICD-10-CM | POA: Insufficient documentation

## 2019-12-30 DIAGNOSIS — I509 Heart failure, unspecified: Secondary | ICD-10-CM | POA: Insufficient documentation

## 2019-12-30 DIAGNOSIS — F411 Generalized anxiety disorder: Secondary | ICD-10-CM | POA: Insufficient documentation

## 2019-12-30 DIAGNOSIS — E782 Mixed hyperlipidemia: Secondary | ICD-10-CM

## 2019-12-30 DIAGNOSIS — I2510 Atherosclerotic heart disease of native coronary artery without angina pectoris: Secondary | ICD-10-CM | POA: Insufficient documentation

## 2019-12-30 DIAGNOSIS — Z79890 Hormone replacement therapy (postmenopausal): Secondary | ICD-10-CM | POA: Insufficient documentation

## 2019-12-30 DIAGNOSIS — Z955 Presence of coronary angioplasty implant and graft: Secondary | ICD-10-CM | POA: Insufficient documentation

## 2019-12-30 DIAGNOSIS — M159 Polyosteoarthritis, unspecified: Secondary | ICD-10-CM | POA: Insufficient documentation

## 2019-12-30 DIAGNOSIS — Z79899 Other long term (current) drug therapy: Secondary | ICD-10-CM | POA: Insufficient documentation

## 2019-12-30 DIAGNOSIS — Z1211 Encounter for screening for malignant neoplasm of colon: Secondary | ICD-10-CM | POA: Insufficient documentation

## 2019-12-30 DIAGNOSIS — Z0000 Encounter for general adult medical examination without abnormal findings: Secondary | ICD-10-CM | POA: Insufficient documentation

## 2019-12-30 DIAGNOSIS — Z1239 Encounter for other screening for malignant neoplasm of breast: Secondary | ICD-10-CM

## 2019-12-30 DIAGNOSIS — E039 Hypothyroidism, unspecified: Secondary | ICD-10-CM | POA: Insufficient documentation

## 2019-12-30 DIAGNOSIS — I110 Hypertensive heart disease with heart failure: Secondary | ICD-10-CM | POA: Insufficient documentation

## 2019-12-30 DIAGNOSIS — Z1231 Encounter for screening mammogram for malignant neoplasm of breast: Secondary | ICD-10-CM | POA: Insufficient documentation

## 2019-12-30 DIAGNOSIS — F17210 Nicotine dependence, cigarettes, uncomplicated: Secondary | ICD-10-CM | POA: Insufficient documentation

## 2019-12-30 DIAGNOSIS — J449 Chronic obstructive pulmonary disease, unspecified: Secondary | ICD-10-CM | POA: Insufficient documentation

## 2019-12-30 DIAGNOSIS — I10 Essential (primary) hypertension: Secondary | ICD-10-CM

## 2019-12-30 MED ORDER — CARBAMAZEPINE ER 100 MG TABLET,EXTENDED RELEASE,12 HR
100.0000 mg | ORAL_TABLET | Freq: Two times a day (BID) | ORAL | 3 refills | Status: DC
Start: 2019-12-30 — End: 2022-11-25

## 2019-12-30 MED ORDER — CLONAZEPAM 1 MG TABLET
ORAL_TABLET | ORAL | 5 refills | Status: DC
Start: 2019-12-30 — End: 2020-02-27

## 2019-12-30 NOTE — Progress Notes (Signed)
[name]  [address] [place]  [place] [address]  [phone]   Progress Note    Date of Service:  12/30/2019  [name], [name], 64 y.o. female  Date of Birth:  23-Mar-1956  PCP: Betha Loa, MD    Chief Complaint:  Follow-up (C/O leg cramps)       HPI:  [name] [name] is a 64 y.o. White female who is seen in the [facility] patient is here for routine follow-up with the complaint of leg cramps.  Patient states that she has been doing fairly well.  She has had no acute exacerbation of chest pain and no significant problems with anginal symptoms dyspnea exertion more than her baseline.  The patient states that she does have some shortness of breath at times but continues to smoke over half a pack a day.  The patient is having no significant productive cough.  She has no pleuritic pain and she has not had any significant sinus type drainage.  Her thyroid dose has been unchanged and she has not had any significant palpitations.  The patient's reflux has been stable at this point without any change in her medicine.  Patient does have a generalized osteoarthritis of her hand and overall seems to be doing okay.  Patient is anxious at this point and not had any significant change in her anxiety.  The patient has not had a mammogram for some time.  The patient is also in need of colon cancer screening.    Patient's medications were reconciled and current.    Past Medical History:   Diagnosis Date   . CAD (coronary artery disease) 05/05/2018   . CHF (congestive heart failure) (CMS HCC) 05/05/2018   . COPD (chronic obstructive pulmonary disease) (CMS [place]) 05/05/2018   . GAD (generalized anxiety disorder) 05/05/2018   . GERD (gastroesophageal reflux disease) 05/05/2018   . History of heart artery stent 05/05/2018   . Hyperlipidemia 05/05/2018   . Hypertension 05/05/2018   . Hypothyroidism 05/05/2018   . Nicotine dependence,  cigarettes, uncomplicated 08/10/7679   . Right shoulder pain 05/05/2018      Past Surgical History:   Procedure Laterality Date   . CORONARY ARTERY ANGIOPLASTY     . HX APPENDECTOMY     . HX CESAREAN SECTION     . HX HEART CATHETERIZATION     . LEG SURGERY Right       Social History     Tobacco Use   . Smoking status: Current Every Day Smoker   . Smokeless tobacco: Never Used   . Tobacco comment: one pack daily since age 62   Vaping Use   . Vaping Use: Never used   Substance Use Topics   . Alcohol use: Yes     Comment: occasional   . Drug use: Never       Family Medical History:     Problem Relation (Age of Onset)    Cancer Other    Diabetes Other    Lymphoma Other         Outpatient Medications Marked as Taking for the 12/30/19 encounter (Office Visit) with Betha Loa, MD   Medication Sig   . atorvastatin (LIPITOR) 40 mg Oral Tablet Take 1 tablet by mouth once daily   . carBAMazepine (TEGRETOL XR) 100 mg Oral Tablet Sustained Release 12 hr Take 1 Tablet (100 mg total) by mouth Twice daily   .  carvediloL (COREG) 6.25 mg Oral Tablet Take 1 tablet by mouth twice daily with food   . clonazePAM (KLONOPIN) 1 mg Oral Tablet TAKE 1 TABLET BY MOUTH THREE TIMES DAILY   . furosemide (LASIX) 20 mg Oral Tablet Take 20 mg by mouth Once a day as needed.   Marland Kitchen levothyroxine (SYNTHROID) 125 mcg Oral Tablet Take 1 Tab (125 mcg total) by mouth Every morning   . lisinopriL (PRINIVIL) 10 mg Oral Tablet Take 1 tablet by mouth once daily   . pantoprazole (PROTONIX) 40 mg Oral Tablet, Delayed Release (E.C.) Take 1 tablet by mouth once daily      Allergies   Allergen Reactions   . Sulfa (Sulfonamides)         REVIEW OF SYSTEMS:  GENERAL:  Positive for fatigue, without significant weight change, sleep disturbance  HEENT: No recent visual or hearing changes.   RESPIRATORY: No SOB, cough, wheezes  CARDIAC: No chest pains; no palpitations; no edema  GI: no N/V/D, no abdominal pain.  BMs regular  MUSCULOSKELETAL:  Complains of nocturnal leg  myalgias, no joint pain, no edema or erythema  NEUROLOGIC: no headache, weakness, numbness or tingling  GU: no urinary sxs or flank pain  SKIN:  negative for rashes.    PHYSICAL EXAM:   The patient appears to be in no acute distress.  Vitals: BP 113/79   Pulse 82   Temp 35.9 C (96.6 F) (Temporal)   Resp 16   Ht 1.727 m (5\' 8" )   Wt 91.6 kg (202 lb)   SpO2 95%   BMI 30.71 kg/m       HEENT:   Head Normocephalic. No masses, lesions, tenderness or abnormalities  Eyes: PERRLA, EOMI, conjunctiva wnl.    Ears: Bilateral TMs intact and clear.  Nose: Bilateral nares patent  Throat: Pharynx clear. No lymphadenopathy or thyromegaly.  RESPIRATORY: CTA without rales rhonchi or wheezes, no resp distress, equal BS  HEART: Regular rate and rhythm without ectopy or pauses, no murmur, no edema, pedal pulses 2+  GI:  BS present in 4 quadrants.  No tenderness, masses, organomegaly.  Abdomen soft.  Morbidly obese without rebound  EXTREMITIES: no edema, erythema or tenderness  NEURO: AAOx3, CN's intact, DTR's 2/4 throughout, Romberg not tested    Data Review:  Pertinent laboratory data and imaging studies reviewed.      Assessment/Plan:  1. Gastroesophageal reflux disease, unspecified whether esophagitis present    2. History of heart artery stent    3. Mixed hyperlipidemia    4. Hypothyroidism, unspecified type    5. Nicotine dependence, cigarettes, uncomplicated    6. Chronic obstructive pulmonary disease, unspecified COPD type (CMS HCC)    7. Coronary artery disease, angina presence unspecified, unspecified vessel or lesion type, unspecified whether native or transplanted heart    8. Essential hypertension    9. Generalized osteoarthritis of hand    10. GAD (generalized anxiety disorder)    11. Encounter for screening for malignant neoplasm of breast, unspecified screening modality    12. Colon cancer screening         Return in about 4 months (around 04/30/2020).      The patient is having no significant issues at this time.   The patient is taking Protonix 40 mg daily for reflux.  She is taking lisinopril 10 for hypertension and heart disease as well as the Coreg 6.25 twice daily.  She continues atorvastatin 40 mg daily for hyperlipidemia is not had any significant myalgias  although she is having muscle cramps at night.  She will be started with Tegretol 100 mg at bedtime and increase it to 2 at bedtime if needed.  The patient will continue clonazepam 1 mg twice daily for generalized anxiety at this time.  She continues her Synthroid 125 mcg.  The patient has outstanding blood work order for CBC comprehensive lipid profile TSH and a UA.  Patient will get this blood work done and try the Tegretol.  The patient will be scheduled for mammogram and she will have a Cologuard performed.  Patient otherwise return to office in 4 months unless she has further problems or labs dictate a sooner follow-up is needed.    This is an established Patient. Pt has been seen in last 3 years.   Betha Loa, MD

## 2020-01-02 ENCOUNTER — Other Ambulatory Visit (RURAL_HEALTH_CENTER): Payer: Self-pay | Admitting: Family Medicine

## 2020-01-02 ENCOUNTER — Telehealth (RURAL_HEALTH_CENTER): Payer: Self-pay | Admitting: Family Medicine

## 2020-01-02 DIAGNOSIS — Z1211 Encounter for screening for malignant neoplasm of colon: Secondary | ICD-10-CM

## 2020-01-02 NOTE — Telephone Encounter (Signed)
Call to [facility] to check benefits and authorization requirements for Cologuard [phone]).  No authorization required per [facility] D/Ref# [id number]. Will pay @ 100% of allowable amount.  Per Vira Agar C/Ref# [id number], [employer] is in network.

## 2020-01-09 ENCOUNTER — Ambulatory Visit: Payer: Medicare (Managed Care) | Attending: Family Medicine

## 2020-01-09 ENCOUNTER — Other Ambulatory Visit: Payer: Self-pay

## 2020-01-09 DIAGNOSIS — I10 Essential (primary) hypertension: Secondary | ICD-10-CM | POA: Insufficient documentation

## 2020-01-09 DIAGNOSIS — E782 Mixed hyperlipidemia: Secondary | ICD-10-CM | POA: Insufficient documentation

## 2020-01-09 DIAGNOSIS — E039 Hypothyroidism, unspecified: Secondary | ICD-10-CM | POA: Insufficient documentation

## 2020-01-09 DIAGNOSIS — Z955 Presence of coronary angioplasty implant and graft: Secondary | ICD-10-CM | POA: Insufficient documentation

## 2020-01-09 LAB — LIPID PANEL
CHOL/HDL RATIO: 4.6
CHOLESTEROL: 190 mg/dL (ref 100–200)
HDL CHOL: 41 mg/dL — ABNORMAL LOW (ref >=50)
LDL CALC: 109 mg/dL — ABNORMAL HIGH (ref <100)
NON-HDL: 149 mg/dL (ref <=190)
TRIGLYCERIDES: 200 mg/dL — ABNORMAL HIGH (ref <150)
VLDL CALC: 40 mg/dL — ABNORMAL HIGH (ref <30)

## 2020-01-09 LAB — CBC WITH DIFF
BASOPHIL #: 0.11 10*3/uL (ref <=0.20)
BASOPHIL %: 1 %
EOSINOPHIL #: 0.1 10*3/uL (ref <=0.50)
EOSINOPHIL %: 1 %
HCT: 42.6 % (ref 34.8–46.0)
HGB: 14.4 g/dL (ref 11.5–16.0)
IMMATURE GRANULOCYTE #: <0.1 10*3/uL (ref <0.10)
IMMATURE GRANULOCYTE %: 0 % (ref 0–1)
LYMPHOCYTE #: 2.42 10*3/uL (ref 1.00–4.80)
LYMPHOCYTE %: 25 %
MCH: 32.3 pg — ABNORMAL HIGH (ref 26.0–32.0)
MCHC: 33.8 g/dL (ref 31.0–35.5)
MCV: 95.5 fL (ref 78.0–100.0)
MONOCYTE #: 0.61 10*3/uL (ref 0.20–1.10)
MONOCYTE %: 6 %
MPV: 11.3 fL (ref 8.7–12.5)
NEUTROPHIL #: 6.57 10*3/uL (ref 1.50–7.70)
NEUTROPHIL %: 67 %
PLATELETS: 255 10*3/uL (ref 150–400)
RBC: 4.46 10*6/uL (ref 3.85–5.22)
RDW-CV: 12.9 % (ref 11.5–15.5)
WBC: 9.9 10*3/uL (ref 3.7–11.0)

## 2020-01-09 LAB — COMPREHENSIVE METABOLIC PNL, FASTING
ALBUMIN: 3.9 g/dL (ref 3.4–4.8)
ALKALINE PHOSPHATASE: 102 U/L (ref 50–130)
ALT (SGPT): 18 U/L (ref 8–22)
ANION GAP: 10 mmol/L (ref 4–13)
AST (SGOT): 12 U/L (ref 8–45)
BILIRUBIN TOTAL: 0.6 mg/dL (ref 0.3–1.3)
BUN/CREA RATIO: 16 (ref 6–22)
BUN: 15 mg/dL (ref 8–25)
CALCIUM: 9.4 mg/dL (ref 8.8–10.2)
CHLORIDE: 105 mmol/L (ref 96–111)
CO2 TOTAL: 27 mmol/L (ref 23–31)
CREATININE: 0.96 mg/dL (ref 0.60–1.05)
ESTIMATED GFR: 62 mL/min/BSA (ref >=60)
GLUCOSE: 95 mg/dL (ref 70–99)
POTASSIUM: 4 mmol/L (ref 3.5–5.1)
PROTEIN TOTAL: 6.9 g/dL (ref 6.0–8.0)
SODIUM: 142 mmol/L (ref 136–145)

## 2020-01-09 LAB — THYROID STIMULATING HORMONE (SENSITIVE TSH): TSH: 0.221 u[IU]/mL — ABNORMAL LOW (ref 0.430–3.550)

## 2020-01-10 ENCOUNTER — Other Ambulatory Visit (RURAL_HEALTH_CENTER): Payer: Self-pay | Admitting: Family Medicine

## 2020-01-10 DIAGNOSIS — E039 Hypothyroidism, unspecified: Secondary | ICD-10-CM

## 2020-01-10 MED ORDER — LEVOTHYROXINE 112 MCG TABLET
112.0000 ug | ORAL_TABLET | Freq: Every morning | ORAL | 0 refills | Status: DC
Start: 2020-01-10 — End: 2020-05-24

## 2020-02-02 ENCOUNTER — Ambulatory Visit (HOSPITAL_COMMUNITY): Payer: Self-pay

## 2020-02-13 ENCOUNTER — Other Ambulatory Visit (RURAL_HEALTH_CENTER): Payer: Self-pay | Admitting: Family Medicine

## 2020-02-13 DIAGNOSIS — K219 Gastro-esophageal reflux disease without esophagitis: Secondary | ICD-10-CM

## 2020-02-25 ENCOUNTER — Other Ambulatory Visit (RURAL_HEALTH_CENTER): Payer: Self-pay | Admitting: Family Medicine

## 2020-02-25 DIAGNOSIS — I10 Essential (primary) hypertension: Secondary | ICD-10-CM

## 2020-02-26 ENCOUNTER — Other Ambulatory Visit (RURAL_HEALTH_CENTER): Payer: Self-pay | Admitting: Family Medicine

## 2020-02-26 DIAGNOSIS — F411 Generalized anxiety disorder: Secondary | ICD-10-CM

## 2020-03-26 ENCOUNTER — Ambulatory Visit (HOSPITAL_COMMUNITY): Payer: Self-pay

## 2020-04-30 ENCOUNTER — Encounter (RURAL_HEALTH_CENTER): Payer: Self-pay | Admitting: Family Medicine

## 2020-05-02 ENCOUNTER — Encounter (RURAL_HEALTH_CENTER): Payer: Medicare (Managed Care) | Admitting: Family Medicine

## 2020-05-09 ENCOUNTER — Encounter (RURAL_HEALTH_CENTER): Payer: Medicare (Managed Care) | Admitting: Family Medicine

## 2020-05-09 DIAGNOSIS — G4762 Sleep related leg cramps: Secondary | ICD-10-CM | POA: Insufficient documentation

## 2020-05-17 ENCOUNTER — Other Ambulatory Visit: Payer: Self-pay

## 2020-05-17 ENCOUNTER — Encounter (RURAL_HEALTH_CENTER): Payer: Self-pay | Admitting: Family Medicine

## 2020-05-17 ENCOUNTER — Ambulatory Visit: Payer: Medicare (Managed Care) | Attending: Family Medicine | Admitting: Family Medicine

## 2020-05-17 VITALS — BP 135/85 | HR 84 | Temp 97.1°F | Resp 17 | Ht 68.0 in | Wt 201.6 lb

## 2020-05-17 DIAGNOSIS — G4762 Sleep related leg cramps: Secondary | ICD-10-CM

## 2020-05-17 DIAGNOSIS — Z79899 Other long term (current) drug therapy: Secondary | ICD-10-CM | POA: Insufficient documentation

## 2020-05-17 DIAGNOSIS — Z0000 Encounter for general adult medical examination without abnormal findings: Secondary | ICD-10-CM | POA: Insufficient documentation

## 2020-05-17 DIAGNOSIS — I10 Essential (primary) hypertension: Secondary | ICD-10-CM

## 2020-05-17 DIAGNOSIS — I509 Heart failure, unspecified: Secondary | ICD-10-CM | POA: Insufficient documentation

## 2020-05-17 DIAGNOSIS — E039 Hypothyroidism, unspecified: Secondary | ICD-10-CM | POA: Insufficient documentation

## 2020-05-17 DIAGNOSIS — J449 Chronic obstructive pulmonary disease, unspecified: Secondary | ICD-10-CM | POA: Insufficient documentation

## 2020-05-17 DIAGNOSIS — F411 Generalized anxiety disorder: Secondary | ICD-10-CM | POA: Insufficient documentation

## 2020-05-17 DIAGNOSIS — I110 Hypertensive heart disease with heart failure: Secondary | ICD-10-CM | POA: Insufficient documentation

## 2020-05-17 DIAGNOSIS — F17210 Nicotine dependence, cigarettes, uncomplicated: Secondary | ICD-10-CM | POA: Insufficient documentation

## 2020-05-17 DIAGNOSIS — Z955 Presence of coronary angioplasty implant and graft: Secondary | ICD-10-CM | POA: Insufficient documentation

## 2020-05-17 DIAGNOSIS — E782 Mixed hyperlipidemia: Secondary | ICD-10-CM | POA: Insufficient documentation

## 2020-05-17 DIAGNOSIS — R252 Cramp and spasm: Secondary | ICD-10-CM | POA: Insufficient documentation

## 2020-05-17 DIAGNOSIS — K219 Gastro-esophageal reflux disease without esophagitis: Secondary | ICD-10-CM | POA: Insufficient documentation

## 2020-05-17 DIAGNOSIS — M159 Polyosteoarthritis, unspecified: Secondary | ICD-10-CM | POA: Insufficient documentation

## 2020-05-17 DIAGNOSIS — M5442 Lumbago with sciatica, left side: Secondary | ICD-10-CM | POA: Insufficient documentation

## 2020-05-17 MED ORDER — KETOROLAC 30 MG/ML (1 ML) INJECTION SOLUTION
30.0000 mg | Freq: Once | INTRAMUSCULAR | Status: AC
Start: 2020-05-17 — End: 2020-05-17
  Administered 2020-05-17: 15:00:00 30 mg via INTRAMUSCULAR

## 2020-05-17 MED ORDER — METHYLPREDNISOLONE 4 MG TABLETS IN A DOSE PACK
ORAL_TABLET | ORAL | 0 refills | Status: DC
Start: 2020-05-17 — End: 2020-10-09

## 2020-05-17 MED ORDER — LISINOPRIL 10 MG TABLET
10.0000 mg | ORAL_TABLET | Freq: Every day | ORAL | 3 refills | Status: DC
Start: 2020-05-17 — End: 2021-02-27

## 2020-05-17 MED ORDER — TRIAMCINOLONE ACETONIDE 40 MG/ML SUSPENSION FOR INJECTION
40.0000 mg | INTRAMUSCULAR | Status: AC
Start: 2020-05-17 — End: 2020-05-17
  Administered 2020-05-17: 40 mg via INTRAMUSCULAR

## 2020-05-17 MED ORDER — CLONAZEPAM 1 MG TABLET
1.0000 mg | ORAL_TABLET | Freq: Three times a day (TID) | ORAL | 3 refills | Status: DC
Start: 2020-05-17 — End: 2020-09-12

## 2020-05-17 MED ORDER — CARVEDILOL 6.25 MG TABLET
6.2500 mg | ORAL_TABLET | Freq: Two times a day (BID) | ORAL | 3 refills | Status: DC
Start: 2020-05-17 — End: 2021-06-10

## 2020-05-17 MED ORDER — ATORVASTATIN 40 MG TABLET
ORAL_TABLET | ORAL | 3 refills | Status: DC
Start: 2020-05-17 — End: 2021-01-07

## 2020-05-17 MED ORDER — PANTOPRAZOLE 40 MG TABLET,DELAYED RELEASE
40.0000 mg | DELAYED_RELEASE_TABLET | Freq: Every day | ORAL | 3 refills | Status: DC
Start: 2020-05-17 — End: 2021-03-18

## 2020-05-17 NOTE — Progress Notes (Signed)
FAMILY MEDICINE, [place] [address]    Medicare Annual Wellness Visit    Name: [name] [name] MRN:  [id number]   Date: 05/17/2020 Age: 65 y.o.       SUBJECTIVE:   [name] [name] is a 65 y.o. female for presenting for Medicare Wellness exam.   I have reviewed and reconciled the medication list with the patient today.    Comprehensive Health Assessment:  Paper document [place] reviewed, signed and scanned into medical record    I have reviewed and updated as appropriate the past medical, family and social history. 05/17/2020 as summarized below:  Past Medical History:   Diagnosis Date   . CAD (coronary artery disease) 05/05/2018   . CHF (congestive heart failure) (CMS HCC) 05/05/2018   . COPD (chronic obstructive pulmonary disease) (CMS [facility]) 05/05/2018   . GAD (generalized anxiety disorder) 05/05/2018   . GERD (gastroesophageal reflux disease) 05/05/2018   . History of heart artery stent 05/05/2018   . Hyperlipidemia 05/05/2018   . Hypertension 05/05/2018   . Hypothyroidism 05/05/2018   . Nicotine dependence, cigarettes, uncomplicated [id number]   . Right shoulder pain 05/05/2018     Past Surgical History:   Procedure Laterality Date   . Coronary artery angioplasty     . Hx appendectomy     . Hx cesarean section     . Hx heart catheterization     . Leg surgery Right      Current Outpatient Medications   Medication Sig   . atorvastatin (LIPITOR) 40 mg Oral Tablet Take 1 tablet by mouth once daily   . carBAMazepine (TEGRETOL XR) 100 mg Oral Tablet Sustained Release 12 hr Take 1 Tablet (100 mg total) by mouth Twice daily   . carvediloL (COREG) 6.25 mg Oral Tablet Take 1 Tablet (6.25 mg total) by mouth Twice daily with food   . clonazePAM (KLONOPIN) 1 mg Oral Tablet Take 1 Tablet (1 mg total) by mouth Three times a day   . furosemide (LASIX) 20 mg Oral Tablet Take 20 mg by mouth Once a day as needed.   Marland Kitchen levothyroxine (SYNTHROID)  112 mcg Oral Tablet Take 1 Tablet (112 mcg total) by mouth Every morning   . lisinopriL (PRINIVIL) 10 mg Oral Tablet Take 1 Tablet (10 mg total) by mouth Once a day   . Methylprednisolone (MEDROL DOSEPACK) 4 mg Oral Tablets, Dose Pack Take as instructed.   . nitroGLYCERIN (NITROSTAT) 0.4 mg Sublingual Tablet, Sublingual DISSOLVE ONE TABLET UNDER THE TONGUE EVERY 5 MINUTES AS NEEDED FOR CHEST PAIN.  DO NOT EXCEED A TOTAL OF 3 DOSES IN 15 MINUTES   . pantoprazole (PROTONIX) 40 mg Oral Tablet, Delayed Release (E.C.) Take 1 Tablet (40 mg total) by mouth Once a day     Family Medical History:     Problem Relation (Age of Onset)    Cancer Other    Diabetes Other    Lymphoma Other          Social History     Socioeconomic History   . Marital status: Widowed   Occupational History   . Occupation: Retired   Tobacco Use   . Smoking status: Current Every Day Smoker   . Smokeless tobacco: Never Used   . Tobacco comment: one pack daily since age 14   Vaping Use   . Vaping Use: Never used   Substance and Sexual Activity   .  Alcohol use: Yes     Comment: occasional   . Drug use: Never   . Sexual activity: Yes     Review of Systems: Pertinent items are noted in HPI.     List of Current Health Care Providers   Care Team     PCP     Name Type Specialty Phone Number    Betha Loa, MD Physician [place] [phone]          Care Team     Name Type Specialty Phone Number    Hollace Kinnier, MD Not available [facility] [phone]                  Health Maintenance   Topic Date Due   . Osteoporosis screening  Never done   . Hepatitis C screening  Never done   . Covid-19 Vaccine (1) Never done   . HIV Screening  Never done   . Adult Tdap-Td (1 - Tdap) Never done   . Pap smear  Never done   . Colonoscopy  Never done   . Mammography  Never done   . Shingles Vaccine (1 of 2) Never done   . Pneumococcal Vaccination, Age 38+ (1 of 1 - PPSV23) Never done   . Influenza Vaccine (1) Never done   . Depression Screening  05/17/2021   .  Annual Wellness Visit - Calendar Year [employer]  Completed     Medicare Wellness Assessment   Medicare initial or wellness physical in the last year?: No  Advance Directives (optional)   Does patient have a living will or MPOA: no       Advance directive information given to the patient today?: no      Activities of Daily Living   Do you need help with dressing, bathing, or walking?: No   Do you need help with shopping, housekeeping, medications, or finances?: No   Do you have rugs in hallways, broken steps, or poor lighting?: No   Do you have grab bars in your bathroom, non-slip strips in your tub, and hand rails on your stairs?: Yes   Urinary Incontinence Screen (Women >=65 only)   Do you ever leak urine when you don't want to?: YES   Cognitive Function Screen (1=Yes, 0=No)   What is you age?: Correct   What is the time to the nearest hour?: Correct   What is the year?: Correct   What is the name of this clinic?: Correct   Can the patient recognize two persons (the doctor, the nurse, home help, etc.)?: Correct   What is the date of your birth? (day and month sufficient) : Correct   In what year did World War II end?: Incorrect   Who is the current president of the [place]?: Correct   Count from 20 down to 1?: Correct   What address did I give you earlier?: Correct   Total Score: 9       Hearing Screen   Have you noticed any hearing difficulties?: No  After whispering 9-1-6 how many numbers did the patient repeat correctly?: 3   Fall Risk Screen   Do you feel unsteady when standing or walking?: No  Do you worry about falling?: No  Have you fallen in the past year?: No   Vision Screen   Right Eye = 20: 30   Left Eye = 20: 40   Depression Screen     Little interest or pleasure in doing things.: Not  at all  Feeling down, depressed, or hopeless: Several Days  PHQ 2 Total: 1        OBJECTIVE:   BP 135/85   Pulse 84   Temp 36.2 C (97.1 F)   Resp 17   Ht 1.727 m (5\' 8" )   Wt 91.4 kg (201 lb 9.6 oz)   SpO2 97%    BMI 30.65 kg/m        Other appropriate exam:  HEENT is grossly within normal limits.  Heart was regular rate rhythm without murmur gallop rub ectopy or pauses.  Lungs were clear to auscultation all fields.  Abdomen was morbidly obese without organomegaly masses or tenderness.  Bowel sounds normal active.  Extremities showed no cyanosis clubbing or edema.  Neurologically the patient had no obvious weakness or defect.  Straight leg raising was negative.    Health Maintenance Due   Topic Date Due   . Osteoporosis screening  Never done   . Hepatitis C screening  Never done   . Covid-19 Vaccine (1) Never done   . HIV Screening  Never done   . Adult Tdap-Td (1 - Tdap) Never done   . Pap smear  Never done   . Colonoscopy  Never done   . Mammography  Never done   . Shingles Vaccine (1 of 2) Never done   . Pneumococcal Vaccination, Age 7+ (1 of 1 - PPSV23) Never done   . Influenza Vaccine (1) Never done      ASSESSMENT & PLAN:   1. GAD (generalized anxiety disorder)    2. Medicare annual wellness visit, initial    3. Nocturnal leg cramps    4. Generalized osteoarthritis of hand    5. Hypothyroidism, unspecified type    6. Gastroesophageal reflux disease, unspecified whether esophagitis present    7. History of heart artery stent    8. Mixed hyperlipidemia    9. Primary hypertension    10. Chronic obstructive pulmonary disease, unspecified COPD type (CMS HCC)    11. Congestive heart failure, unspecified HF chronicity, unspecified heart failure type (CMS HCC)    12. Acute left-sided low back pain with left-sided sciatica    13. Essential hypertension       Identified Risk Factors/ Recommended Actions         Urinary Incontinence Plan of Care: Lifestyle modifications  Orders Placed This Encounter   . triamcinolone acetonide (KENALOG-40) 40 mg/mL injection   . ketorolac (TORADOL) 30 mg/mL injection   . Methylprednisolone (MEDROL DOSEPACK) 4 mg Oral Tablets, Dose Pack   . atorvastatin (LIPITOR) 40 mg Oral Tablet   .  carvediloL (COREG) 6.25 mg Oral Tablet   . clonazePAM (KLONOPIN) 1 mg Oral Tablet   . lisinopriL (PRINIVIL) 10 mg Oral Tablet   . pantoprazole (PROTONIX) 40 mg Oral Tablet, Delayed Release (E.C.)          The patient has been educated about risk factors and recommended preventive care. Written Prevention Plan completed/ updated and given to patient (see After Visit Summary).  I personally saw the patient. See nurses note for additional details. My findings/particpation are that I personally discussed the prevention plan with the patient including lifestyle modifications.  Patient is actually doing fairly well.  She has not had any acute CHF symptoms lately and has not had any anginal symptoms.  She still has her Tegretol she takes for her leg cramps but does not take it regularly.  The patient continues her Synthroid dosing which seems  to be working well.  The patient continues with her clonazepam 1 mg 3 times daily which seems to do well for her anxiety.  She is taking atorvastatin 40 mg daily for hyperlipidemia and her last labs look good.  She continues with her lisinopril 10 mg daily as well as her Coreg 6.25 twice daily for her CHF and heart.  She is also having no significant palpitations no swelling at this time and takes her Lasix on a p.r.n. basis.  Her thyroid dosing is been good.  The patient is taking her pantoprazole 40 mg for her reflux medicine without any adverse effects.  The patient states for the last 2-3 days she has had acute onset of this left lower back pain with sciatic issues going down the left posterior leg to the knee.  She has no significant weakness in the leg and does not have any significant radicular symptoms past the knee.  At this point the patient will be given a Kenalog 40 mg daily as well as a Toradol 30.  She will be placed on Medrol Dosepak and pending outcome of the results of the medication may need to have physical therapy and/or some plain x-rays.  Patient appears to have  a acute left-sided sciatica issue.  The patient will continue all other current treatments and she will return to our office in 4 months unless she has further problems then she would return sooner.     Patient advised on the adverse effects of tobacco.  Patient was instructed of the benefits of quitting.  Patient was provided the opportunity to be referred to tobacco dependence groups if patient would like and patient was also offered nicotine replacement products.  I have spent 5 minutes counseling the patient.    Return in about 4 months (around 09/14/2020).    Betha Loa, MD  FAMILY MEDICINE, [facility]  [address] [place] [address]  Phone: [phone]  Fax: [phone]

## 2020-05-17 NOTE — Patient Instructions (Signed)
Medicare Preventive Services  Medicare coverage information Recommendation for YOU   Heart Disease and Diabetes   Lipid profile Every 5 years or more often if at risk for cardiovascular disease  Last Lipid Panel  (Last result in the past 2 years)      Cholesterol   HDL   LDL   Direct LDL   Triglycerides      01/09/20 1354 190   41   109  Comment: <100 mg/dL, Optimal  100-129 mg/dL, Near/Above Optimal  130-159 mg/dL, Borderline High  160-189 mg/dL, High  >=190 mg/dL, Very high     200           Diabetes Screening  yearly for those at risk for diabetes, 2 tests per year for those with prediabetes Last Glucose: 95    Diabetes Self Management Training or Medical Nutrition Therapy  For those with diabetes, up to 10 hrs initial training within a year, subsequent years up to 2 hrs of follow up training Optional for those with diabetes     Medical Nutrition Therapy Three hours of one-on-one counseling in first year, two hours in subsequent years Optional for those with diabetes, kidney disease   Intensive Behavioral Therapy for Obesity  Face-to-face counseling, first month every week, month 2-6 every other week, month 7-12 every month if continued progress is documented Optional for those with Body Mass Index 30 or higher  Your Body mass index is 30.65 kg/m.   Tobacco Cessation (Quitting) Counseling   Two attempts per year, max 4 sessions per attempt, up to 8 per year, for those with tobacco-related health condition Optional for those that use tobacco   Cancer Screening   Colorectal screening   For anyone age 60 to 55 or any age if high risk:  . Screening Colonoscopy every 10 yrs if low risk,  more frequent if higher risk  OR  . Flexible  Sigmoidoscopy  every 5 yr OR  . Fecal Occult Blood Testing yearly OR  . Cologuard Stool DNA test once every 3 years OR  . CT Colonography every 5 yrs    See your schedule below   Screening Pap Test Recommended every 3 years for all women age 57 to 71, or every five years if combined  with HPV test (routine screening not needed after total hysterectomy).  Medicare covers every 2 years, up to yearly if high risk.  Screening Pelvic Exam Medicare covers every 2 years, yearly if high risk or childbearing age with abnormal Pap in last 3 yrs. See your schedule below   Screening Mammogram   Recommended every 1-2 years for women age 62 to 80, and selectively recommended for women between 15-49 based on shared decisions about risk. Covered by Medicare up to every year for women age 68 or older See your schedule below   Lung Cancer Screening  Annual low dose computed tomography (LDCT scan) is recommended for those age 41-77 who smoked 30 pack-years and are current smokers or quit smoking within past 15 years (one pack-year= smoking one PPD for one year), after counseling by your doctor or nurse clinician about the possible benefits or harms See your schedule below   Vaccinations   Pneumococcal Vaccine Recommended routinely age 75+ with two separate vaccines one year apart (Prevnar then Pneumovax).  Recommended before age 62 if medical conditions increase risk  Seasonal Influenza Vaccine Once every flu season   Hepatitis B Vaccine 3 doses if risk (including anyone with diabetes or liver disease)  Shingles Vaccine Once or twice at age 63 or older  Diphtheria Tetanus Pertussis Vaccine ONCE as adult, booster every 10 years     Immunization History   Administered Date(s) Administered   . HEPATITIS A VACCINE (ADMIN)-ADULT 08/05/2017     Shingles vaccine and Diphtheria Tetanus Pertussis vaccines are available at pharmacies or local health department without a prescription.   Other Screening   Bone Densitometry   Every 24 months for anyone at risk, including postmenopausal       Glaucoma Screening   Yearly if in high risk group such as diabetes, family history, African American age 91+ or Hispanic American age 44+      Hepatitis C Screening recommended ONCE for those born between 1945-1965, or high risk for HCV  infection       HIV Testing recommended routinely at least ONCE, covered every year for age 73 to 82 regardless of risk, and every year for age over 54 who ask for the test or higher risk  Yearly or up to 3 times in pregnancy         Abdominal Aortic Aneurysm Screening Ultrasound   Once between the age of 51-75 with a family history of AAA       Your Personalized Schedule for Preventive Tests   Health Maintenance: Pending and Last Completed       Date Due Completion Date    Osteoporosis screening Never done ---    Hepatitis C screening Never done ---    Covid-19 Vaccine (1) Never done ---    HIV Screening Never done ---    Adult Tdap-Td (1 - Tdap) Never done ---    Pap smear Never done ---    Colonoscopy Never done ---    Mammography Never done ---    Shingles Vaccine (1 of 2) Never done ---    Pneumococcal Vaccination, Age 63+ (1 of 1 - PPSV23) Never done ---    Influenza Vaccine (1) Never done ---    Annual Wellness Visit - Calendar Year [employer] Never done ---    Depression Screening 05/17/2021 05/17/2020

## 2020-05-17 NOTE — Nursing Note (Signed)
05/17/20 1400   Medicare Wellness Assessment   Medicare initial or wellness physical in the last year? No   Advance Directives   Does patient have a living will or MPOA no   Advance directive information given to the patient today? no   Activities of Daily Living   Do you need help with dressing, bathing, or walking? No   Do you need help with shopping, housekeeping, medications, or finances? No   Do you have rugs in hallways, broken steps, or poor lighting? No   Do you have grab bars in your bathroom, non-slip strips in your tub, and hand rails on your stairs? Yes   Urinary Incontinence Screen-Women only   Do you ever leak urine when you don't want to? YES   Cognitive Function Screen   What is you age? 1   What is the time to the nearest hour? 1   What is the year? 1   What is the name of this clinic? 1   Can the patient recognize two persons (the doctor, the nurse, home help, etc.)? 1   What is the date of your birth? (day and month sufficient)  1   In what year did World War II end? 0   Who is the current president of the [place]? 1   Count from 20 down to 1? 1   What address did I give you earlier? 1   Total Score 9   Depression Screen   Little interest or pleasure in doing things. 0   Feeling down, depressed, or hopeless 1   PHQ 2 Total 1   Hearing Screen   Have you noticed any hearing difficulties? No   After whispering 9-1-6 how many numbers did the patient repeat correctly? 3   Fall Risk Assessment   Do you feel unsteady when standing or walking? No   Do you worry about falling? No   Have you fallen in the past year? No   Vision Screen   Right Eye = 20 30   Left Eye = 20 40

## 2020-05-19 ENCOUNTER — Other Ambulatory Visit (RURAL_HEALTH_CENTER): Payer: Self-pay | Admitting: Family Medicine

## 2020-05-19 DIAGNOSIS — E039 Hypothyroidism, unspecified: Secondary | ICD-10-CM

## 2020-05-24 ENCOUNTER — Other Ambulatory Visit (RURAL_HEALTH_CENTER): Payer: Self-pay | Admitting: Family Medicine

## 2020-05-24 ENCOUNTER — Telehealth (RURAL_HEALTH_CENTER): Payer: Self-pay | Admitting: Family Medicine

## 2020-05-24 DIAGNOSIS — M5442 Lumbago with sciatica, left side: Secondary | ICD-10-CM

## 2020-05-24 DIAGNOSIS — E039 Hypothyroidism, unspecified: Secondary | ICD-10-CM

## 2020-05-24 MED ORDER — LEVOTHYROXINE 112 MCG TABLET
112.0000 ug | ORAL_TABLET | Freq: Every morning | ORAL | 1 refills | Status: DC
Start: 2020-05-24 — End: 2020-10-09

## 2020-05-24 NOTE — Telephone Encounter (Signed)
Patient called reporting she was given a medrol dosepak on 05/17/20 for lower left back pain. Patient stated the medrol dosepak did not work and is asking what the next step is.

## 2020-05-24 NOTE — Telephone Encounter (Signed)
Patient has been referred to [facility].

## 2020-05-25 ENCOUNTER — Telehealth (RURAL_HEALTH_CENTER): Payer: Self-pay | Admitting: Family Medicine

## 2020-05-25 NOTE — Telephone Encounter (Signed)
Patient called reporting she is not scheduled to start physical therapy until 06/05/2020. Patient is requesting a prescription to help with her back pain until she can start therapy.

## 2020-05-28 ENCOUNTER — Other Ambulatory Visit (RURAL_HEALTH_CENTER): Payer: Self-pay | Admitting: Family Medicine

## 2020-05-28 DIAGNOSIS — M549 Dorsalgia, unspecified: Secondary | ICD-10-CM

## 2020-05-28 DIAGNOSIS — M25511 Pain in right shoulder: Secondary | ICD-10-CM

## 2020-05-28 MED ORDER — IBUPROFEN 600 MG TABLET
600.0000 mg | ORAL_TABLET | Freq: Three times a day (TID) | ORAL | 1 refills | Status: DC | PRN
Start: 2020-05-28 — End: 2021-01-09

## 2020-05-28 NOTE — Telephone Encounter (Signed)
Send in ibuprofen 600 mg to take 1 3 times daily as needed for back pain dispense 90 1 refill

## 2020-05-28 NOTE — Telephone Encounter (Signed)
Patient notified

## 2020-09-12 ENCOUNTER — Encounter (RURAL_HEALTH_CENTER): Payer: Self-pay | Admitting: Family Medicine

## 2020-09-12 ENCOUNTER — Other Ambulatory Visit (RURAL_HEALTH_CENTER): Payer: Self-pay | Admitting: Family Medicine

## 2020-09-12 DIAGNOSIS — F411 Generalized anxiety disorder: Secondary | ICD-10-CM

## 2020-09-25 ENCOUNTER — Encounter (RURAL_HEALTH_CENTER): Payer: Self-pay | Admitting: Family Medicine

## 2020-10-09 ENCOUNTER — Other Ambulatory Visit: Payer: Self-pay

## 2020-10-09 ENCOUNTER — Ambulatory Visit: Payer: Medicare (Managed Care) | Attending: Family Medicine | Admitting: Family Medicine

## 2020-10-09 ENCOUNTER — Encounter (RURAL_HEALTH_CENTER): Payer: Self-pay | Admitting: Family Medicine

## 2020-10-09 VITALS — BP 116/74 | HR 76 | Temp 96.7°F | Resp 17 | Ht 67.99 in | Wt 201.2 lb

## 2020-10-09 DIAGNOSIS — Z791 Long term (current) use of non-steroidal anti-inflammatories (NSAID): Secondary | ICD-10-CM | POA: Insufficient documentation

## 2020-10-09 DIAGNOSIS — I2510 Atherosclerotic heart disease of native coronary artery without angina pectoris: Secondary | ICD-10-CM

## 2020-10-09 DIAGNOSIS — Z79899 Other long term (current) drug therapy: Secondary | ICD-10-CM | POA: Insufficient documentation

## 2020-10-09 DIAGNOSIS — R609 Edema, unspecified: Secondary | ICD-10-CM | POA: Insufficient documentation

## 2020-10-09 DIAGNOSIS — K219 Gastro-esophageal reflux disease without esophagitis: Secondary | ICD-10-CM

## 2020-10-09 DIAGNOSIS — I10 Essential (primary) hypertension: Secondary | ICD-10-CM

## 2020-10-09 DIAGNOSIS — G4762 Sleep related leg cramps: Secondary | ICD-10-CM

## 2020-10-09 DIAGNOSIS — J449 Chronic obstructive pulmonary disease, unspecified: Secondary | ICD-10-CM

## 2020-10-09 DIAGNOSIS — M722 Plantar fascial fibromatosis: Secondary | ICD-10-CM | POA: Insufficient documentation

## 2020-10-09 DIAGNOSIS — E039 Hypothyroidism, unspecified: Secondary | ICD-10-CM | POA: Insufficient documentation

## 2020-10-09 DIAGNOSIS — Z79890 Hormone replacement therapy (postmenopausal): Secondary | ICD-10-CM | POA: Insufficient documentation

## 2020-10-09 DIAGNOSIS — F411 Generalized anxiety disorder: Secondary | ICD-10-CM | POA: Insufficient documentation

## 2020-10-09 DIAGNOSIS — R5383 Other fatigue: Secondary | ICD-10-CM | POA: Insufficient documentation

## 2020-10-09 DIAGNOSIS — E782 Mixed hyperlipidemia: Secondary | ICD-10-CM | POA: Insufficient documentation

## 2020-10-09 DIAGNOSIS — F17210 Nicotine dependence, cigarettes, uncomplicated: Secondary | ICD-10-CM | POA: Insufficient documentation

## 2020-10-09 MED ORDER — CLONAZEPAM 1 MG TABLET
1.0000 mg | ORAL_TABLET | Freq: Three times a day (TID) | ORAL | 3 refills | Status: DC
Start: 2020-10-09 — End: 2021-01-09

## 2020-10-09 MED ORDER — LEVOTHYROXINE 112 MCG TABLET
112.0000 ug | ORAL_TABLET | Freq: Every morning | ORAL | 2 refills | Status: DC
Start: 2020-10-09 — End: 2020-12-04

## 2020-10-09 NOTE — Progress Notes (Signed)
[facility]  [address] [place]  [place] [address]  [phone]   Progress Note    Date of Service:  10/09/2020  [name] [name], [name] [name], 65 y.o. female  Date of Birth:  1955-12-14  PCP: Betha Loa, MD    Chief Complaint:  Foot Pain (left foot pain ts)       HPI:  [name] [name] is a 65 y.o. White female who is seen in the [facility] patient is here for left foot pain but also complaining of other issues.  Patient needs her clonazepam refilled she also has had 1 episode of some chest pain which he took 1 nitroglycerin for.  This occurred when she lay down at night was nonexertional did not radiate was not associated with any shortness of breath diaphoresis or nausea.  Patient has not seen a cardiologist for some time.  Patient has had no acute congestive heart failure symptoms.  She has had a history of the stent as well as the history of hypertension hyperlipidemia.  No seem to be doing reasonably well with her medicine.  The patient is having no significant problems with her chronic back pain and nocturnal leg cramps for which she takes her medicine.  Her anxieties been about the same but she needs refill on her current anxiety medicine.  Her reflux is been about the same she has not had any significant issues with nausea vomiting.  Patient has had no significant palpitations PND orthopnea her thyroid dosing seems good.  She does have her fluid medicine she takes and states that she has had a little bit of edema from time to time.    Medications reconciled and current.     Past Medical History:   Diagnosis Date   . CAD (coronary artery disease) 05/05/2018   . CHF (congestive heart failure) (CMS HCC) 05/05/2018   . COPD (chronic obstructive pulmonary disease) (CMS [place]) 05/05/2018   . GAD (generalized anxiety disorder) 05/05/2018   . GERD (gastroesophageal reflux disease) 05/05/2018   . History of heart artery  stent 05/05/2018   . Hyperlipidemia 05/05/2018   . Hypertension 05/05/2018   . Hypothyroidism 05/05/2018   . Nicotine dependence, cigarettes, uncomplicated 2/84/1324   . Right shoulder pain 05/05/2018      Past Surgical History:   Procedure Laterality Date   . CORONARY ARTERY ANGIOPLASTY     . HX APPENDECTOMY     . HX CESAREAN SECTION     . HX HEART CATHETERIZATION     . LEG SURGERY Right       Social History     Tobacco Use   . Smoking status: Current Every Day Smoker   . Smokeless tobacco: Never Used   . Tobacco comment: one pack daily since age 76   Vaping Use   . Vaping Use: Never used   Substance Use Topics   . Alcohol use: Yes     Comment: occasional   . Drug use: Never       Family Medical History:     Problem Relation (Age of Onset)    Cancer Other    Diabetes Other    Lymphoma Other         Outpatient Medications Marked as Taking for the 10/09/20 encounter (Office Visit) with Betha Loa, MD   Medication Sig   . atorvastatin (LIPITOR) 40 mg Oral Tablet Take 1 tablet by mouth once daily   .  carBAMazepine (TEGRETOL XR) 100 mg Oral Tablet Sustained Release 12 hr Take 1 Tablet (100 mg total) by mouth Twice daily   . carvediloL (COREG) 6.25 mg Oral Tablet Take 1 Tablet (6.25 mg total) by mouth Twice daily with food   . clonazePAM (KLONOPIN) 1 mg Oral Tablet TAKE 1 TABLET BY MOUTH THREE TIMES DAILY   . furosemide (LASIX) 20 mg Oral Tablet Take 20 mg by mouth Once a day as needed.   . Ibuprofen (MOTRIN) 600 mg Oral Tablet Take 1 Tablet (600 mg total) by mouth Three times a day as needed for Pain   . levothyroxine (SYNTHROID) 112 mcg Oral Tablet Take 1 Tablet (112 mcg total) by mouth Every morning   . lisinopriL (PRINIVIL) 10 mg Oral Tablet Take 1 Tablet (10 mg total) by mouth Once a day   . nitroGLYCERIN (NITROSTAT) 0.4 mg Sublingual Tablet, Sublingual DISSOLVE ONE TABLET UNDER THE TONGUE EVERY 5 MINUTES AS NEEDED FOR CHEST PAIN.  DO NOT EXCEED A TOTAL OF 3 DOSES IN 15 MINUTES   . pantoprazole (PROTONIX) 40 mg  Oral Tablet, Delayed Release (E.C.) Take 1 Tablet (40 mg total) by mouth Once a day      Allergies   Allergen Reactions   . Sulfa (Sulfonamides)         Review of Systems:  Positive findings addressed in HPI      PHYSICAL EXAM:   The patient appears to be in no acute distress.  Vitals: BP 116/74   Pulse 76   Temp 35.9 C (96.7 F) (Temporal)   Resp 17   Ht 1.727 m (5' 7.99")   Wt 91.3 kg (201 lb 3.2 oz)   SpO2 95%   BMI 30.60 kg/m       HEENT:   Head Normocephalic. No masses, lesions, tenderness or abnormalities  Eyes: PERRLA, EOMI, conjunctiva wnl.    Ears: Bilateral TMs intact and clear.  Nose: Bilateral nares patent  Throat: Pharynx clear. No lymphadenopathy or thyromegaly.  RESPIRATORY: CTA without rales rhonchi or wheezes, no resp distress, equal BS  HEART: Regular rate and rhythm without ectopy or pauses, no murmur, trace edema, pedal pulses 2+  GI:  BS present in 4 quadrants.  No tenderness, masses, organomegaly.  Abdomen soft.  Markedly to morbidly obese without rebound  EXTREMITIES:  Left foot shows mild tenderness over the arch of the plantar fascia without tenderness over the insertion, good arches noted, no swelling or deformity.  Trace edema, erythema or tenderness  NEURO: AAOx3, CN's intact, DTR's 2/4 throughout, Romberg not tested    Data Review:  Pertinent laboratory data and imaging studies reviewed.      Assessment/Plan:  1. Gastroesophageal reflux disease, unspecified whether esophagitis present    2. GAD (generalized anxiety disorder)    3. Hypothyroidism, unspecified type    4. Chronic obstructive pulmonary disease, unspecified COPD type (CMS HCC)    5. Nicotine dependence, cigarettes, uncomplicated    6. Primary hypertension    7. Mixed hyperlipidemia    8. Coronary artery disease, unspecified vessel or lesion type, unspecified whether angina present, unspecified whether native or transplanted heart    9. Nocturnal leg cramps    10. Edema, unspecified type    11. Plantar fasciitis of  left foot         Return in about 3 months (around 01/09/2021).      Patient appears to have a recurrence her plantar fasciitis in the mid plantar arch on the left foot.  She does not have any other significant abnormalities and was given some stretching exercises.  She has ibuprofen which we tried to limit because of her heart disease and takes 600 mg up to 3 times daily.  She continues her Tegretol 100 mg twice daily for leg cramps.  She takes her Lipitor 40 mg daily for hyperlipidemia is tolerated well.  She does have her lisinopril 10 mg daily along with her Coreg 6.25 mg daily for her heart and is stable with that.  Her blood pressure is reasonably good including taking Lasix 20 mg as needed for fluid.  She was given her clonazepam she takes mg 3 times daily and has done for while.  Patient also takes her Synthroid 112 mcg daily in addition.  The patient's Protonix she takes 40 mg daily for reflux.  She needs to have blood work will have fasting CBC comprehensive lipid and thyroid.  Patient will do her stretching exercise take her ibuprofen a for foot continues to bother her which appears to be getting better she will notify our office and be referred to podiatry.  Patient otherwise return to our office in 3 months for routine follow-up.  The patient was encouraged to discuss her atypical chest pain she had with her cardiologist and get an appointment to follow-up there as well.    This is an established Patient. Pt has been seen in last 3 years.   Betha Loa, MD

## 2020-10-23 ENCOUNTER — Encounter (RURAL_HEALTH_CENTER): Payer: Self-pay | Admitting: Family Medicine

## 2020-12-03 ENCOUNTER — Other Ambulatory Visit (RURAL_HEALTH_CENTER): Payer: Self-pay | Admitting: Family Medicine

## 2020-12-03 DIAGNOSIS — E039 Hypothyroidism, unspecified: Secondary | ICD-10-CM

## 2020-12-31 ENCOUNTER — Other Ambulatory Visit (HOSPITAL_COMMUNITY): Payer: Self-pay | Admitting: Family

## 2020-12-31 DIAGNOSIS — I509 Heart failure, unspecified: Secondary | ICD-10-CM

## 2021-01-04 ENCOUNTER — Ambulatory Visit: Payer: Medicare (Managed Care) | Attending: Family Medicine

## 2021-01-04 ENCOUNTER — Other Ambulatory Visit: Payer: Self-pay

## 2021-01-04 DIAGNOSIS — E782 Mixed hyperlipidemia: Secondary | ICD-10-CM

## 2021-01-04 DIAGNOSIS — R609 Edema, unspecified: Secondary | ICD-10-CM | POA: Insufficient documentation

## 2021-01-04 DIAGNOSIS — R5383 Other fatigue: Secondary | ICD-10-CM | POA: Insufficient documentation

## 2021-01-04 DIAGNOSIS — I10 Essential (primary) hypertension: Secondary | ICD-10-CM

## 2021-01-04 LAB — COMPREHENSIVE METABOLIC PNL, FASTING
ALBUMIN: 3.8 g/dL (ref 3.4–4.8)
ALKALINE PHOSPHATASE: 100 U/L (ref 55–145)
ALT (SGPT): 18 U/L (ref 8–22)
ANION GAP: 7 mmol/L (ref 4–13)
AST (SGOT): 18 U/L (ref 8–45)
BILIRUBIN TOTAL: 0.4 mg/dL (ref 0.3–1.3)
BUN/CREA RATIO: 17 (ref 6–22)
BUN: 15 mg/dL (ref 8–25)
CALCIUM: 9.4 mg/dL (ref 8.8–10.2)
CHLORIDE: 113 mmol/L — ABNORMAL HIGH (ref 96–111)
CO2 TOTAL: 22 mmol/L — ABNORMAL LOW (ref 23–31)
CREATININE: 0.88 mg/dL (ref 0.60–1.05)
ESTIMATED GFR: 72 mL/min/BSA (ref >=60)
GLUCOSE: 94 mg/dL (ref 70–99)
POTASSIUM: 3.9 mmol/L (ref 3.5–5.1)
PROTEIN TOTAL: 7.2 g/dL (ref 6.0–8.0)
SODIUM: 142 mmol/L (ref 136–145)

## 2021-01-04 LAB — CBC WITH DIFF
BASOPHIL #: <0.1 10*3/uL (ref <=0.20)
BASOPHIL %: 1 %
EOSINOPHIL #: <0.1 10*3/uL (ref <=0.50)
EOSINOPHIL %: 1 %
HCT: 37.6 % (ref 34.8–46.0)
HGB: 13.4 g/dL (ref 11.5–16.0)
IMMATURE GRANULOCYTE #: <0.1 10*3/uL (ref <0.10)
IMMATURE GRANULOCYTE %: 0 % (ref 0–1)
LYMPHOCYTE #: 2.44 10*3/uL (ref 1.00–4.80)
LYMPHOCYTE %: 30 %
MCH: 32.7 pg — ABNORMAL HIGH (ref 26.0–32.0)
MCHC: 35.6 g/dL — ABNORMAL HIGH (ref 31.0–35.5)
MCV: 91.7 fL (ref 78.0–100.0)
MONOCYTE #: 0.49 10*3/uL (ref 0.20–1.10)
MONOCYTE %: 6 %
MPV: 11.1 fL (ref 8.7–12.5)
NEUTROPHIL #: 5.14 10*3/uL (ref 1.50–7.70)
NEUTROPHIL %: 62 %
PLATELETS: 282 10*3/uL (ref 150–400)
RBC: 4.1 10*6/uL (ref 3.85–5.22)
RDW-CV: 12.9 % (ref 11.5–15.5)
WBC: 8.3 10*3/uL (ref 3.7–11.0)

## 2021-01-04 LAB — LIPID PANEL
CHOL/HDL RATIO: 6.7
CHOLESTEROL: 215 mg/dL — ABNORMAL HIGH (ref 100–200)
HDL CHOL: 32 mg/dL — ABNORMAL LOW (ref >=50)
LDL CALC: 120 mg/dL — ABNORMAL HIGH (ref <100)
NON-HDL: 183 mg/dL (ref <=190)
TRIGLYCERIDES: 316 mg/dL — ABNORMAL HIGH (ref <150)
VLDL CALC: 63 mg/dL — ABNORMAL HIGH (ref <30)

## 2021-01-07 ENCOUNTER — Other Ambulatory Visit (RURAL_HEALTH_CENTER): Payer: Self-pay | Admitting: Family Medicine

## 2021-01-07 ENCOUNTER — Encounter (RURAL_HEALTH_CENTER): Payer: Medicare (Managed Care) | Admitting: Family Medicine

## 2021-01-07 DIAGNOSIS — E782 Mixed hyperlipidemia: Secondary | ICD-10-CM

## 2021-01-07 MED ORDER — ATORVASTATIN 80 MG TABLET
ORAL_TABLET | ORAL | 3 refills | Status: DC
Start: 2021-01-07 — End: 2022-04-17

## 2021-01-09 ENCOUNTER — Encounter (RURAL_HEALTH_CENTER): Payer: Self-pay | Admitting: Family Medicine

## 2021-01-09 ENCOUNTER — Ambulatory Visit: Payer: Medicare (Managed Care) | Attending: Family Medicine

## 2021-01-09 ENCOUNTER — Other Ambulatory Visit (RURAL_HEALTH_CENTER): Payer: Self-pay | Admitting: Family Medicine

## 2021-01-09 ENCOUNTER — Ambulatory Visit: Payer: Medicare (Managed Care) | Attending: Family Medicine | Admitting: Family Medicine

## 2021-01-09 ENCOUNTER — Other Ambulatory Visit: Payer: Self-pay

## 2021-01-09 VITALS — BP 102/73 | HR 71 | Temp 96.8°F | Resp 17 | Ht 67.99 in | Wt 195.8 lb

## 2021-01-09 DIAGNOSIS — E039 Hypothyroidism, unspecified: Secondary | ICD-10-CM | POA: Insufficient documentation

## 2021-01-09 DIAGNOSIS — J449 Chronic obstructive pulmonary disease, unspecified: Secondary | ICD-10-CM | POA: Insufficient documentation

## 2021-01-09 DIAGNOSIS — F411 Generalized anxiety disorder: Secondary | ICD-10-CM | POA: Insufficient documentation

## 2021-01-09 DIAGNOSIS — E782 Mixed hyperlipidemia: Secondary | ICD-10-CM | POA: Insufficient documentation

## 2021-01-09 DIAGNOSIS — M549 Dorsalgia, unspecified: Secondary | ICD-10-CM

## 2021-01-09 DIAGNOSIS — Z955 Presence of coronary angioplasty implant and graft: Secondary | ICD-10-CM

## 2021-01-09 DIAGNOSIS — Z791 Long term (current) use of non-steroidal anti-inflammatories (NSAID): Secondary | ICD-10-CM | POA: Insufficient documentation

## 2021-01-09 DIAGNOSIS — I2510 Atherosclerotic heart disease of native coronary artery without angina pectoris: Secondary | ICD-10-CM | POA: Insufficient documentation

## 2021-01-09 DIAGNOSIS — I110 Hypertensive heart disease with heart failure: Secondary | ICD-10-CM | POA: Insufficient documentation

## 2021-01-09 DIAGNOSIS — I509 Heart failure, unspecified: Secondary | ICD-10-CM | POA: Insufficient documentation

## 2021-01-09 DIAGNOSIS — G4762 Sleep related leg cramps: Secondary | ICD-10-CM | POA: Insufficient documentation

## 2021-01-09 DIAGNOSIS — F17210 Nicotine dependence, cigarettes, uncomplicated: Secondary | ICD-10-CM | POA: Insufficient documentation

## 2021-01-09 DIAGNOSIS — K219 Gastro-esophageal reflux disease without esophagitis: Secondary | ICD-10-CM | POA: Insufficient documentation

## 2021-01-09 DIAGNOSIS — M5442 Lumbago with sciatica, left side: Secondary | ICD-10-CM | POA: Insufficient documentation

## 2021-01-09 DIAGNOSIS — Z79899 Other long term (current) drug therapy: Secondary | ICD-10-CM | POA: Insufficient documentation

## 2021-01-09 DIAGNOSIS — I10 Essential (primary) hypertension: Secondary | ICD-10-CM

## 2021-01-09 DIAGNOSIS — R609 Edema, unspecified: Secondary | ICD-10-CM | POA: Insufficient documentation

## 2021-01-09 LAB — THYROID STIMULATING HORMONE (SENSITIVE TSH): TSH: 0.323 u[IU]/mL — ABNORMAL LOW (ref 0.430–3.550)

## 2021-01-09 MED ORDER — CLONAZEPAM 1 MG TABLET
1.0000 mg | ORAL_TABLET | Freq: Three times a day (TID) | ORAL | 3 refills | Status: DC
Start: 2021-01-09 — End: 2021-05-20

## 2021-01-09 MED ORDER — IBUPROFEN 600 MG TABLET
600.0000 mg | ORAL_TABLET | Freq: Three times a day (TID) | ORAL | 1 refills | Status: DC | PRN
Start: 2021-01-09 — End: 2021-01-28

## 2021-01-09 MED ORDER — ESCITALOPRAM 10 MG TABLET
10.0000 mg | ORAL_TABLET | Freq: Every day | ORAL | 1 refills | Status: DC
Start: 2021-01-09 — End: 2022-12-23

## 2021-01-09 NOTE — Progress Notes (Signed)
[facility]  [address] [place]  [place] [address]  [phone]   Progress Note    Date of Service:  01/09/2021  [name], [name], 65 y.o. female  Date of Birth:  July 18, 1955  PCP: Betha Loa, MD    Chief Complaint:  Follow Up 3 Months       HPI:  [name] [name] is a 65 y.o. White female who is seen in the [facility] patient is here for routine follow-up.  The patient states that she is having some shortness of breath still and is scheduled to have an echo done later this month.  The patient states that she did see her cardiologist and they evaluated that.  She has not had really any chest pains palpitations PND orthopnea just the shortness of breath.  She has no signs or symptoms of CHF she has not had any increased swelling she has not had any increased PND and no increased nocturia.  She continues to smoke about the same.  Her anxieties been flared up little bit she continues take her medicine and her somewhat to gel so she is having little more stressed.  She has no homicidal suicidal thoughts.  She says she took Prozac in the past and really made her worse so she does not want take that.  She is willing to try another medicine however.  The patient's blood pressure overall is doing well.  Her back pains about the same she continues her thyroid dosing the same as well.  Her nocturnal leg cramps are unchanged and she has not had any acute exacerbation of that.  Patient's swelling as mention is done well.      Medications reconciled and current.     Past Medical History:   Diagnosis Date   . CAD (coronary artery disease) 05/05/2018   . CHF (congestive heart failure) (CMS HCC) 05/05/2018   . COPD (chronic obstructive pulmonary disease) (CMS [place]) 05/05/2018   . GAD (generalized anxiety disorder) 05/05/2018   . GERD (gastroesophageal reflux disease) 05/05/2018   . History of heart artery stent 05/05/2018   .  Hyperlipidemia 05/05/2018   . Hypertension 05/05/2018   . Hypothyroidism 05/05/2018   . Nicotine dependence, cigarettes, uncomplicated 5/57/3220   . Right shoulder pain 05/05/2018      Past Surgical History:   Procedure Laterality Date   . CORONARY ARTERY ANGIOPLASTY     . HX APPENDECTOMY     . HX CESAREAN SECTION     . HX HEART CATHETERIZATION     . LEG SURGERY Right       Social History     Tobacco Use   . Smoking status: Current Every Day Smoker   . Smokeless tobacco: Never Used   . Tobacco comment: one pack daily since age 63   Vaping Use   . Vaping Use: Never used   Substance Use Topics   . Alcohol use: Yes     Comment: occasional   . Drug use: Never       Family Medical History:     Problem Relation (Age of Onset)    Cancer Other    Diabetes Other    Lymphoma Other         Outpatient Medications Marked as Taking for the 01/09/21 encounter (Office Visit) with Betha Loa, MD   Medication Sig   . atorvastatin (LIPITOR) 80 mg Oral Tablet Take 1  tablet by mouth once daily Indications: high cholesterol and high triglycerides, treatment to prevent a heart attack   . carBAMazepine (TEGRETOL XR) 100 mg Oral Tablet Sustained Release 12 hr Take 1 Tablet (100 mg total) by mouth Twice daily   . carvediloL (COREG) 6.25 mg Oral Tablet Take 1 Tablet (6.25 mg total) by mouth Twice daily with food   . clonazePAM (KLONOPIN) 1 mg Oral Tablet Take 1 Tablet (1 mg total) by mouth Three times a day   . escitalopram oxalate (LEXAPRO) 10 mg Oral Tablet Take 1 Tablet (10 mg total) by mouth Once a day   . EUTHYROX 112 mcg Oral Tablet TAKE 1 TABLET BY MOUTH IN THE MORNING   . furosemide (LASIX) 20 mg Oral Tablet Take 20 mg by mouth Once a day as needed.   Marland Kitchen lisinopriL (PRINIVIL) 10 mg Oral Tablet Take 1 Tablet (10 mg total) by mouth Once a day   . pantoprazole (PROTONIX) 40 mg Oral Tablet, Delayed Release (E.C.) Take 1 Tablet (40 mg total) by mouth Once a day      Allergies   Allergen Reactions   . Sulfa (Sulfonamides)         Review of  Systems:  Positive findings addressed in HPI      PHYSICAL EXAM:   The patient appears to be in no acute distress.  Vitals: BP 102/73   Pulse 71   Temp 36 C (96.8 F)   Resp 17   Ht 1.727 m (5' 7.99")   Wt 88.8 kg (195 lb 12.8 oz)   SpO2 96%   BMI 29.78 kg/m       HEENT:   Head Normocephalic. No masses, lesions, tenderness or abnormalities  Eyes: PERRLA, EOMI, conjunctiva wnl.    Ears: Bilateral TMs intact and clear.  Nose: Bilateral nares patent  Throat: Pharynx clear. No lymphadenopathy or thyromegaly.  RESPIRATORY: CTA without active rales rhonchi or wheezes, no resp distress, equal BS  HEART: Regular rate and rhythm without ectopy or pauses, no murmur, no edema, pedal pulses 2+  GI:  BS present in 4 quadrants.  No tenderness, masses, organomegaly.  Abdomen soft.  Moderately obese without rebound  EXTREMITIES: no pitting edema, erythema or tenderness  NEURO: AAOx3, CN's intact, DTR's 2/4 throughout, Romberg not tested    Data Review:  Pertinent laboratory data and imaging studies reviewed.      Assessment/Plan:  1. Coronary artery disease, unspecified vessel or lesion type, unspecified whether angina present, unspecified whether native or transplanted heart    2. Congestive heart failure, unspecified HF chronicity, unspecified heart failure type (CMS HCC)    3. History of heart artery stent    4. Mixed hyperlipidemia    5. Primary hypertension    6. Nicotine dependence, cigarettes, uncomplicated    7. Chronic obstructive pulmonary disease, unspecified COPD type (CMS HCC)    8. Gastroesophageal reflux disease, unspecified whether esophagitis present    9. Hypothyroidism, unspecified type    10. GAD (generalized anxiety disorder)    11. Edema, unspecified type    12. Nocturnal leg cramps    13. Acute left-sided low back pain with left-sided sciatica         Return in about 4 months (around 05/11/2021).      Patient overall is doing okay.  She will have her echocardiogram and continue with her medicines to  her cardiologist.  She takes her furosemide 20 mg daily for fluid and is taking her lisinopril 10 mg daily  for her heart as well as her blood pressure in addition to the carvedilol 6.25 twice daily.  She takes her atorvastatin 80 mg for hyperlipidemia is stable there.  She is taking Tegretol 100 mg XR twice daily for her nocturnal leg cramps and works well.  She is taking the clonazepam 1 mg 3 times daily and will add Lexapro 10 because of her an additional stress.  She does continue ibuprofen 600 mg for her back and will continue that as well.  She is taking her Protonix 40 mg daily for her reflux.  Patient seems tolerated well will continue all current treatments.  She will return to office in 4 months when she will be due for repeat blood work unless she has further problems inch return sooner.      Patient advised on the adverse effects of tobacco.  Patient was instructed of the benefits of quitting.  Patient was provided the opportunity to be referred to tobacco dependence groups if patient would like and patient was also offered nicotine replacement products.  I have spent 5 minutes counseling the patient.    This is an established Patient. Pt has been seen in last 3 years.   Betha Loa, MD

## 2021-01-25 ENCOUNTER — Inpatient Hospital Stay
Admission: RE | Admit: 2021-01-25 | Discharge: 2021-01-25 | Disposition: A | Discharge status: Home / Self Care | Payer: Medicare (Managed Care) | Source: Ambulatory Visit | Attending: Family | Admitting: Family

## 2021-01-25 ENCOUNTER — Other Ambulatory Visit: Payer: Self-pay

## 2021-01-25 DIAGNOSIS — I509 Heart failure, unspecified: Secondary | ICD-10-CM | POA: Insufficient documentation

## 2021-01-26 DIAGNOSIS — I509 Heart failure, unspecified: Secondary | ICD-10-CM | POA: Insufficient documentation

## 2021-01-28 ENCOUNTER — Encounter (RURAL_HEALTH_CENTER): Payer: Self-pay | Admitting: Family Medicine

## 2021-01-28 ENCOUNTER — Inpatient Hospital Stay
Admission: RE | Admit: 2021-01-28 | Discharge: 2021-01-28 | Disposition: A | Discharge status: Home / Self Care | Payer: Medicare (Managed Care) | Source: Ambulatory Visit | Attending: Family Medicine | Admitting: Family Medicine

## 2021-01-28 ENCOUNTER — Ambulatory Visit: Payer: Medicare (Managed Care) | Attending: Family Medicine | Admitting: Family Medicine

## 2021-01-28 ENCOUNTER — Ambulatory Visit (INDEPENDENT_AMBULATORY_CARE_PROVIDER_SITE_OTHER): Payer: Medicare (Managed Care)

## 2021-01-28 ENCOUNTER — Other Ambulatory Visit: Payer: Self-pay

## 2021-01-28 VITALS — BP 124/82 | HR 81 | Temp 97.1°F | Resp 17 | Ht 67.99 in | Wt 198.6 lb

## 2021-01-28 DIAGNOSIS — M25471 Effusion, right ankle: Secondary | ICD-10-CM | POA: Insufficient documentation

## 2021-01-28 DIAGNOSIS — F411 Generalized anxiety disorder: Secondary | ICD-10-CM | POA: Insufficient documentation

## 2021-01-28 DIAGNOSIS — F17210 Nicotine dependence, cigarettes, uncomplicated: Secondary | ICD-10-CM | POA: Insufficient documentation

## 2021-01-28 DIAGNOSIS — K219 Gastro-esophageal reflux disease without esophagitis: Secondary | ICD-10-CM | POA: Insufficient documentation

## 2021-01-28 DIAGNOSIS — M7752 Other enthesopathy of left foot: Secondary | ICD-10-CM | POA: Insufficient documentation

## 2021-01-28 DIAGNOSIS — J449 Chronic obstructive pulmonary disease, unspecified: Secondary | ICD-10-CM | POA: Insufficient documentation

## 2021-01-28 DIAGNOSIS — M25571 Pain in right ankle and joints of right foot: Secondary | ICD-10-CM | POA: Insufficient documentation

## 2021-01-28 DIAGNOSIS — I509 Heart failure, unspecified: Secondary | ICD-10-CM | POA: Insufficient documentation

## 2021-01-28 DIAGNOSIS — E782 Mixed hyperlipidemia: Secondary | ICD-10-CM | POA: Insufficient documentation

## 2021-01-28 DIAGNOSIS — Z79899 Other long term (current) drug therapy: Secondary | ICD-10-CM | POA: Insufficient documentation

## 2021-01-28 DIAGNOSIS — Z79890 Hormone replacement therapy (postmenopausal): Secondary | ICD-10-CM | POA: Insufficient documentation

## 2021-01-28 DIAGNOSIS — M549 Dorsalgia, unspecified: Secondary | ICD-10-CM | POA: Insufficient documentation

## 2021-01-28 MED ORDER — IBUPROFEN 600 MG TABLET
600.0000 mg | ORAL_TABLET | Freq: Three times a day (TID) | ORAL | 1 refills | Status: DC | PRN
Start: 2021-01-28 — End: 2022-07-05

## 2021-01-28 MED ORDER — TRIAMCINOLONE ACETONIDE 40 MG/ML SUSPENSION FOR INJECTION
40.0000 mg | INTRAMUSCULAR | Status: AC
Start: 2021-01-28 — End: 2021-01-28
  Administered 2021-01-28: 10:00:00 40 mg via INTRAMUSCULAR

## 2021-01-28 MED ORDER — METHYLPREDNISOLONE 4 MG TABLETS IN A DOSE PACK
ORAL_TABLET | ORAL | 0 refills | Status: DC
Start: 2021-01-28 — End: 2021-05-20

## 2021-01-28 MED ORDER — KETOROLAC 30 MG/ML (1 ML) INJECTION SOLUTION
30.0000 mg | Freq: Once | INTRAMUSCULAR | Status: AC
Start: 2021-01-28 — End: 2021-01-28
  Administered 2021-01-28: 10:00:00 30 mg via INTRAMUSCULAR

## 2021-01-28 NOTE — Progress Notes (Signed)
[facility]  [address] [place]  [place] [address]  [phone]   Progress Note    Date of Service:  01/28/2021  [name], Courser, 65 y.o. female  Date of Birth:  1956-01-03  PCP: Betha Loa, MD    Chief Complaint:  Foot Pain       HPI:  [name] [name] is a 65 y.o. White female who is seen in the [facility] patient called in complaining of foot pain.  Patient had previous problems with right plantar fasciitis but is now complaining of swelling on the lateral right ankle.  She says it is very painful for her to get around walking at times as well as even more.  He has had a gets red hot although it is not that way today.  It is worse with after she has been up on it for while during the day it is not worse when she 1st gets up.  Does not seem to radiate anywhere it does not seem to be midline.  She states that she has not had any significant injury that she is aware of to that right ankle.  She is taking occasional ibuprofen but not really on a regular basis.  She said it has been hard for her to work and she missed a little bit of work over the last several weeks because she could get up on her foot regularly.  The patient is having no significant chest pain or palpitations.  She has not had any significant exacerbation of her CHF at this time.  She has not had any previous history of gout.  The patient has not had any significant problems with her thyroid she has no palpitations.  Anxiety overall seems to be about the same.      Medications reconciled and current.     Past Medical History:   Diagnosis Date   . CAD (coronary artery disease) 05/05/2018   . CHF (congestive heart failure) (CMS HCC) 05/05/2018   . COPD (chronic obstructive pulmonary disease) (CMS [place]) 05/05/2018   . GAD (generalized anxiety disorder) 05/05/2018   . GERD (gastroesophageal reflux disease) 05/05/2018   . History of heart artery  stent 05/05/2018   . Hyperlipidemia 05/05/2018   . Hypertension 05/05/2018   . Hypothyroidism 05/05/2018   . Nicotine dependence, cigarettes, uncomplicated 1/70/0174   . Right shoulder pain 05/05/2018      Past Surgical History:   Procedure Laterality Date   . CORONARY ARTERY ANGIOPLASTY     . HX APPENDECTOMY     . HX CESAREAN SECTION     . HX HEART CATHETERIZATION     . LEG SURGERY Right       Social History     Tobacco Use   . Smoking status: Every Day   . Smokeless tobacco: Never   . Tobacco comments:     one pack daily since age 34   Vaping Use   . Vaping Use: Never used   Substance Use Topics   . Alcohol use: Yes     Comment: occasional   . Drug use: Never       Family Medical History:     Problem Relation (Age of Onset)    Cancer Other    Diabetes Other    Lymphoma Other         Outpatient Medications Marked as Taking for the 01/28/21 encounter (Office Visit) with  Betha Loa, MD   Medication Sig   . atorvastatin (LIPITOR) 80 mg Oral Tablet Take 1 tablet by mouth once daily Indications: high cholesterol and high triglycerides, treatment to prevent a heart attack   . carBAMazepine (TEGRETOL XR) 100 mg Oral Tablet Sustained Release 12 hr Take 1 Tablet (100 mg total) by mouth Twice daily   . carvediloL (COREG) 6.25 mg Oral Tablet Take 1 Tablet (6.25 mg total) by mouth Twice daily with food   . clonazePAM (KLONOPIN) 1 mg Oral Tablet Take 1 Tablet (1 mg total) by mouth Three times a day   . escitalopram oxalate (LEXAPRO) 10 mg Oral Tablet Take 1 Tablet (10 mg total) by mouth Once a day   . EUTHYROX 112 mcg Oral Tablet TAKE 1 TABLET BY MOUTH IN THE MORNING   . furosemide (LASIX) 20 mg Oral Tablet Take 20 mg by mouth Once a day as needed.   . Ibuprofen (MOTRIN) 600 mg Oral Tablet Take 1 Tablet (600 mg total) by mouth Three times a day as needed for Pain   . lisinopriL (PRINIVIL) 10 mg Oral Tablet Take 1 Tablet (10 mg total) by mouth Once a day   . Methylprednisolone (MEDROL DOSEPACK) 4 mg Oral Tablets, Dose Pack  Take as instructed.   . pantoprazole (PROTONIX) 40 mg Oral Tablet, Delayed Release (E.C.) Take 1 Tablet (40 mg total) by mouth Once a day      Allergies   Allergen Reactions   . Sulfa (Sulfonamides)         Review of Systems:  Positive findings addressed in HPI      PHYSICAL EXAM:   The patient appears to be in no acute distress.  Vitals: BP 124/82   Pulse 81   Temp 36.2 C (97.1 F) (Temporal)   Resp 17   Ht 1.727 m (5' 7.99")   Wt 90.1 kg (198 lb 9.6 oz)   SpO2 98%   BMI 30.20 kg/m       HEENT:   Head Normocephalic. No masses, lesions, tenderness or abnormalities  Eyes: PERRLA, EOMI, conjunctiva wnl.    Ears: Bilateral TMs intact and clear.  Nose: Bilateral nares patent  Throat: Pharynx clear. No lymphadenopathy or thyromegaly.  RESPIRATORY: CTA decreased without rales rhonchi or wheezes, no resp distress, equal BS  HEART: Regular rate and rhythm without ectopy or pauses, no murmur, no edema, pedal pulses 2+  GI:  BS present in 4 quadrants.  No tenderness, masses, organomegaly.  Abdomen soft.  Markedly obese without rebound  EXTREMITIES:  Right ankle shows mild swelling but no redness mild tenderness was noted.  No obvious abnormality to the Achilles tendon and no obvious plantar fasciitis tenderness noted.  no edema, erythema or tenderness  NEURO: AAOx3, CN's intact, DTR's 2/4 throughout, Romberg not tested    Data Review:  Pertinent laboratory data and imaging studies reviewed.      Assessment/Plan:  1. Right ankle pain    2. Back pain, unspecified back location, unspecified back pain laterality, unspecified chronicity    3. Right ankle swelling    4. Left ankle tendinitis    5. Nicotine dependence, cigarettes, uncomplicated    6. Chronic obstructive pulmonary disease, unspecified COPD type (CMS HCC)    7. Mixed hyperlipidemia    8. Congestive heart failure, unspecified HF chronicity, unspecified heart failure type (CMS HCC)    9. GAD (generalized anxiety disorder)         Return if symptoms worsen or  fail  to improve.      Patient's ankle shows some swelling but no redness and no instability although it is tender to palpate.  She has no Achilles tendinitis she has no plantar fasciitis.  She appears to have a lateral deltoid ligament tendinitis and will be treated with ibuprofen 6 hours mg 3 times daily as well.  She does take furosemide 20 mg daily along with the lisinopril 10 mg daily and carvedilol 6.25 twice daily for her heart.  She has no CHF symptoms currently.  She is taking her Lipitor 80 mg daily as well.  She has clonidine pen 1 mg 3 times daily for anxiety along with the Lexapro 10 she takes daily.  Patient will continue with her Tegretol 100 mg XL daily for her leg cramps and overall seems to be doing well.  She is taking her thyroid 112 mcg daily.  Patient is also taking her Protonix 40 mg daily for her reflux.  Patient was given Kenalog 40 mg daily as well as a Toradol 30.  She was given a Medrol Dosepak and a new prescription for ibuprofen.  She will continue all current treatments she will have an x-ray of the ankle and make sure that there is no obvious bony abnormalities.  She will return to our office as previously scheduled otherwise unless she done respond to treatment and then consideration for ortho referral would be entertained.      This is an established Patient. Pt has been seen in last 3 years.   Betha Loa, MD

## 2021-02-27 ENCOUNTER — Other Ambulatory Visit (RURAL_HEALTH_CENTER): Payer: Self-pay | Admitting: Family Medicine

## 2021-02-27 DIAGNOSIS — I10 Essential (primary) hypertension: Secondary | ICD-10-CM

## 2021-02-27 DIAGNOSIS — E039 Hypothyroidism, unspecified: Secondary | ICD-10-CM

## 2021-03-18 ENCOUNTER — Other Ambulatory Visit (RURAL_HEALTH_CENTER): Payer: Self-pay | Admitting: Family Medicine

## 2021-03-18 DIAGNOSIS — K219 Gastro-esophageal reflux disease without esophagitis: Secondary | ICD-10-CM

## 2021-05-14 ENCOUNTER — Encounter (RURAL_HEALTH_CENTER): Payer: Self-pay | Admitting: Family Medicine

## 2021-05-20 ENCOUNTER — Encounter (RURAL_HEALTH_CENTER): Payer: Self-pay | Admitting: Family Medicine

## 2021-05-20 ENCOUNTER — Ambulatory Visit: Payer: Medicare (Managed Care) | Attending: Family Medicine | Admitting: Family Medicine

## 2021-05-20 ENCOUNTER — Other Ambulatory Visit: Payer: Self-pay

## 2021-05-20 VITALS — BP 104/65 | HR 74 | Temp 96.9°F | Resp 17 | Ht 67.99 in | Wt 190.0 lb

## 2021-05-20 DIAGNOSIS — E039 Hypothyroidism, unspecified: Secondary | ICD-10-CM | POA: Insufficient documentation

## 2021-05-20 DIAGNOSIS — Z955 Presence of coronary angioplasty implant and graft: Secondary | ICD-10-CM | POA: Insufficient documentation

## 2021-05-20 DIAGNOSIS — J309 Allergic rhinitis, unspecified: Secondary | ICD-10-CM | POA: Insufficient documentation

## 2021-05-20 DIAGNOSIS — M67449 Ganglion, unspecified hand: Secondary | ICD-10-CM | POA: Insufficient documentation

## 2021-05-20 DIAGNOSIS — I509 Heart failure, unspecified: Secondary | ICD-10-CM | POA: Insufficient documentation

## 2021-05-20 DIAGNOSIS — I110 Hypertensive heart disease with heart failure: Secondary | ICD-10-CM | POA: Insufficient documentation

## 2021-05-20 DIAGNOSIS — F17210 Nicotine dependence, cigarettes, uncomplicated: Secondary | ICD-10-CM | POA: Insufficient documentation

## 2021-05-20 DIAGNOSIS — Z1239 Encounter for other screening for malignant neoplasm of breast: Secondary | ICD-10-CM

## 2021-05-20 DIAGNOSIS — K219 Gastro-esophageal reflux disease without esophagitis: Secondary | ICD-10-CM | POA: Insufficient documentation

## 2021-05-20 DIAGNOSIS — Z1211 Encounter for screening for malignant neoplasm of colon: Secondary | ICD-10-CM

## 2021-05-20 DIAGNOSIS — G4762 Sleep related leg cramps: Secondary | ICD-10-CM | POA: Insufficient documentation

## 2021-05-20 DIAGNOSIS — Z0001 Encounter for general adult medical examination with abnormal findings: Secondary | ICD-10-CM | POA: Insufficient documentation

## 2021-05-20 DIAGNOSIS — I2510 Atherosclerotic heart disease of native coronary artery without angina pectoris: Secondary | ICD-10-CM | POA: Insufficient documentation

## 2021-05-20 DIAGNOSIS — Z0000 Encounter for general adult medical examination without abnormal findings: Secondary | ICD-10-CM

## 2021-05-20 DIAGNOSIS — M67442 Ganglion, left hand: Secondary | ICD-10-CM | POA: Insufficient documentation

## 2021-05-20 DIAGNOSIS — I10 Essential (primary) hypertension: Secondary | ICD-10-CM

## 2021-05-20 DIAGNOSIS — J449 Chronic obstructive pulmonary disease, unspecified: Secondary | ICD-10-CM | POA: Insufficient documentation

## 2021-05-20 DIAGNOSIS — R609 Edema, unspecified: Secondary | ICD-10-CM | POA: Insufficient documentation

## 2021-05-20 DIAGNOSIS — F411 Generalized anxiety disorder: Secondary | ICD-10-CM | POA: Insufficient documentation

## 2021-05-20 DIAGNOSIS — E782 Mixed hyperlipidemia: Secondary | ICD-10-CM | POA: Insufficient documentation

## 2021-05-20 MED ORDER — CLONAZEPAM 1 MG TABLET
1.0000 mg | ORAL_TABLET | Freq: Three times a day (TID) | ORAL | 3 refills | Status: DC
Start: 2021-05-20 — End: 2021-05-21

## 2021-05-20 MED ORDER — LORATADINE 10 MG TABLET
10.0000 mg | ORAL_TABLET | Freq: Every day | ORAL | 3 refills | Status: DC
Start: 2021-05-20 — End: 2022-07-08

## 2021-05-20 NOTE — Progress Notes (Signed)
FAMILY MEDICINE, [place]  [place] [address]    Medicare Annual Wellness Visit    Name: [name] [name] MRN:  [id number]   Date: 05/20/2021 Age: 66 y.o.       SUBJECTIVE:   [name] [name] is a 65 y.o. female for presenting for Medicare Wellness exam.   I have reviewed and reconciled the medication list with the patient today.    Comprehensive Health Assessment:  Paper document [place] reviewed, signed and scanned into medical record    I have reviewed and updated as appropriate the past medical, family and social history. 05/20/2021 as summarized below:  Past Medical History:   Diagnosis Date   . CAD (coronary artery disease) 05/05/2018   . CHF (congestive heart failure) (CMS HCC) 05/05/2018   . COPD (chronic obstructive pulmonary disease) (CMS [place]) 05/05/2018   . GAD (generalized anxiety disorder) 05/05/2018   . GERD (gastroesophageal reflux disease) 05/05/2018   . History of heart artery stent 05/05/2018   . Hyperlipidemia 05/05/2018   . Hypertension 05/05/2018   . Hypothyroidism 05/05/2018   . Nicotine dependence, cigarettes, uncomplicated 6/83/4196   . Right shoulder pain 05/05/2018     Past Surgical History:   Procedure Laterality Date   . Coronary artery angioplasty     . Hx appendectomy     . Hx cesarean section     . Hx heart catheterization     . Leg surgery Right      Current Outpatient Medications   Medication Sig   . atorvastatin (LIPITOR) 80 mg Oral Tablet Take 1 tablet by mouth once daily Indications: high cholesterol and high triglycerides, treatment to prevent a heart attack   . carBAMazepine (TEGRETOL XR) 100 mg Oral Tablet Sustained Release 12 hr Take 1 Tablet (100 mg total) by mouth Twice daily   . carvediloL (COREG) 6.25 mg Oral Tablet Take 1 Tablet (6.25 mg total) by mouth Twice daily with food   . clonazePAM (KLONOPIN) 1 mg Oral Tablet Take 1 Tablet (1 mg total) by mouth Three times a day   . escitalopram oxalate  (LEXAPRO) 10 mg Oral Tablet Take 1 Tablet (10 mg total) by mouth Once a day   . furosemide (LASIX) 20 mg Oral Tablet Take 1 Tablet (20 mg total) by mouth Once a day as needed.   . Ibuprofen (MOTRIN) 600 mg Oral Tablet Take 1 Tablet (600 mg total) by mouth Three times a day as needed for Pain   . levothyroxine (SYNTHROID) 112 mcg Oral Tablet TAKE 1 TABLET BY MOUTH IN THE MORNING   . lisinopriL (PRINIVIL) 10 mg Oral Tablet Take 1 tablet by mouth once daily   . loratadine (CLARITIN) 10 mg Oral Tablet Take 1 Tablet (10 mg total) by mouth Once a day   . nitroGLYCERIN (NITROSTAT) 0.4 mg Sublingual Tablet, Sublingual DISSOLVE ONE TABLET UNDER THE TONGUE EVERY 5 MINUTES AS NEEDED FOR CHEST PAIN.  DO NOT EXCEED A TOTAL OF 3 DOSES IN 15 MINUTES   . pantoprazole (PROTONIX) 40 mg Oral Tablet, Delayed Release (E.C.) Take 1 tablet by mouth once daily     Family Medical History:     Problem Relation (Age of Onset)    Cancer Other    Diabetes Other    Lymphoma Other          Social History     Socioeconomic History   . Marital status: Widowed   Occupational History   .  Occupation: Retired   Tobacco Use   . Smoking status: Every Day   . Smokeless tobacco: Never   . Tobacco comments:     one pack daily since age 17   Vaping Use   . Vaping Use: Never used   Substance and Sexual Activity   . Alcohol use: Yes     Comment: occasional   . Drug use: Never   . Sexual activity: Yes     Partners: Male        List of [place] Team     PCP     Name Type Specialty Phone Number    Betha Loa, MD Physician [place] [phone]          Care Team     Name Type Specialty Phone Number    Hollace Kinnier, MD Not available EXTERNAL [phone]                  Health Maintenance   Topic Date Due   . Osteoporosis screening  Never done   . Hepatitis C screening  Never done   . Covid-19 Vaccine (1) Never done   . Pneumococcal Vaccination, Age 4+ (1 - PCV) Never done   . Adult Tdap-Td (1 - Tdap) Never done   .  Colonoscopy  Never done   . Mammography  Never done   . Shingles Vaccine (1 of 2) Never done   . Influenza Vaccine (1) Never done   . Annual Wellness Visit - Calendar Year [employer]  04/14/2021   . Depression Screening  05/20/2022   . Meningococcal Vaccine  Aged Out     Medicare Wellness Assessment   Medicare initial or wellness physical in the last year?: No  Advance Directives (optional)   Does patient have a living will or MPOA: no       Advance directive information given to the patient today?: no      Activities of Daily Living   Do you need help with dressing, bathing, or walking?: No   Do you need help with shopping, housekeeping, medications, or finances?: No   Do you have rugs in hallways, broken steps, or poor lighting?: No   Do you have grab bars in your bathroom, non-slip strips in your tub, and hand rails on your stairs?: No   Urinary Incontinence Screen (Women >=65 only)   Do you ever leak urine when you don't want to?: No   Cognitive Function Screen (1=Yes, 0=No)   What is you age?: Correct   What is the time to the nearest hour?: Correct   What is the year?: Correct   What is the name of this clinic?: Correct   Can the patient recognize two persons (the doctor, the nurse, home help, etc.)?: Correct   What is the date of your birth? (day and month sufficient) : Correct   In what year did World War II end?: Incorrect   Who is the current president of the [place]?: Correct   Count from 20 down to 1?: Correct   What address did I give you earlier?: Correct   Total Score: 9       Hearing Screen   Have you noticed any hearing difficulties?: No  After whispering 9-1-6 how many numbers did the patient repeat correctly?: 3   Fall Risk Screen   Do you feel unsteady when standing or walking?: Yes  Do you worry about falling?: No  Have you  fallen in the past year?: Yes  How many times have you fallen?: Once  Were you ever injured from falling?: No  Timed up and go test (in seconds): 3   Vision Screen    Right Eye = 20: 40   Left Eye = 20: 40   Depression Screen   Little interest or pleasure in doing things.: Not at all  Feeling down, depressed, or hopeless: Not at all  PHQ 2 Total: 0   Pain Score   Pain Score:   0 - No pain    Substance Use-Abuse Screening   Tobacco Use   In Past 12 MONTHS, how often have you used any tobacco product (for example, cigarettes, e-cigarettes, cigars, pipes, or smokeless tobacco)?: Daily  In the PAST 3 MONTHS, did you smoke a cigarette containing tobacco or use any other nicotine delivery product (i.e., e-cigarette, vaping or chewing tobacco)?: Yes  In the PAST 3 MONTHS, did you usually smoke more than 10 cigarettes, vape, use an e-cigarette or chew tobacco more than 10 times each day?: Yes  In the PAST 3 MONTHS, did you usually smoke/use an e-cigarette, vape or chew tobacco within 30 minutes after waking?: Yes   Alcohol use   In the PAST 12 MONTHS, how often have you had 5 (men)/4 (women) or more drinks containing alcohol in one day?: Never   Prescription Drug Use   In the PAST 12 months, how often have you used any prescription medications just for the feeling, more than prescribed, or that were not prescribed for you? Prescriptions may include: opioids, benzodiazepines, medications for ADHD: Never         Illicit Drug Use   In the PAST 12 MONTHS, how often have you used any drugs, including marijuana, cocaine or crack, heroin, methamphetamine, hallucinogens, ecstasy/MDMA?: Never           OBJECTIVE:   BP 104/65   Pulse 74   Temp 36.1 C (96.9 F)   Resp 17   Ht 1.727 m (5' 7.99")   Wt 86.2 kg (190 lb)   SpO2 99%   BMI 28.90 kg/m        Other appropriate exam:  HEENT is grossly within normal limits.  Neck showed no JVD no carotid bruits.  Heart was regular rate rhythm without murmur gallop rub detected.  Lungs were clear to auscultation all fields.  Abdomen was soft moderately obese nontender bowel sounds normal active.  Extremities without sinus clubbing or edema.  Small  mucoid cyst noted on the left index finger.    Health Maintenance Due   Topic Date Due   . Osteoporosis screening  Never done   . Hepatitis C screening  Never done   . Covid-19 Vaccine (1) Never done   . Pneumococcal Vaccination, Age 48+ (1 - PCV) Never done   . Adult Tdap-Td (1 - Tdap) Never done   . Colonoscopy  Never done   . Mammography  Never done   . Shingles Vaccine (1 of 2) Never done   . Influenza Vaccine (1) Never done   . Annual Wellness Visit - Calendar Year Ins[employer]  04/14/2021      ASSESSMENT & PLAN:   1. Medicare annual wellness visit, initial    2. Congestive heart failure, unspecified HF chronicity, unspecified heart failure type (CMS HCC)    3. History of heart artery stent    4. Mixed hyperlipidemia    5. Coronary artery disease, unspecified vessel or lesion type, unspecified whether  angina present, unspecified whether native or transplanted heart    6. Primary hypertension    7. Chronic obstructive pulmonary disease, unspecified COPD type (CMS HCC)    8. Nicotine dependence, cigarettes, uncomplicated    9. Gastroesophageal reflux disease, unspecified whether esophagitis present    10. Hypothyroidism, unspecified type    11. GAD (generalized anxiety disorder)    12. Edema, unspecified type    13. Nocturnal leg cramps    14. Screen for colon cancer    15. Encounter for screening for malignant neoplasm of breast, unspecified screening modality    16. Digital mucinous cyst of finger    17. Allergic rhinitis       Identified Risk Factors/ Recommended Actions     Fall Risk Follow up plan of care: Discussed optimizing home safety  The PHQ 2 Total: 0 depression screen is interpreted as negative.          Orders Placed This Encounter   . MAMMO BILATERAL SCREENING-ADDL VIEWS/BREAST US AS REQ BY RAD   . Refer to [facility]   . loratadine (CLARITIN) 10 mg Oral Tablet   . clonazePAM (KLONOPIN) 1 mg Oral Tablet        The patient has been educated about risk factors and recommended preventive  care. Written Prevention Plan completed/ updated and given to patient (see After Visit Summary).  I personally saw the patient. See nurses note for additional details. My findings/particpation are that I personally discussed the prevention plan with the patient including optimizing home safety.  Patient is following up with cardiology and everything seems to be checking out okay.  She is not have any significant chest pains no signs or symptoms of any heart failure and her breathing is been doing okay.  She is taking atorvastatin 80 milligrams daily for her cholesterol.  She continues take the furosemide 20 milligrams daily for fluid and that seems to work okay.  She additionally is still taking her Coreg 6.25 twice daily in addition to the lisinopril 10 milligrams daily for her heart failure and heart function.  She takes Protonix 40 milligrams daily for reflux and has not had any significant indigestion.  Her Synthroid she continues to 112 micrograms and is stable.  Patient will continue with her Tegretol for her leg cramps.  Patient is on the clonazepam 1 milligram 3 times daily along with the Lexapro 10 milligrams daily for her anxiety depression.  Patient will continue this the same medicines and continue current treatments.  The patient will follow-up with surgery for her screening colonoscopy she also has a small cyst on 1 of her fingers they can look at.  She will be scheduled for her mammogram as well.  Patient otherwise return to office in 4 months.     Return in about 4 months (around 09/17/2021).    Betha Loa, MD  FAMILY MEDICINE, [facility]  [address] [place] [address]  Phone: [phone]  Fax: [phone]

## 2021-05-20 NOTE — Patient Instructions (Signed)
Medicare Preventive Services  Medicare coverage information Recommendation for YOU   Heart Disease and Diabetes   Lipid profile Every 5 years or more often if at risk for cardiovascular disease  Last Lipid Panel  (Last result in the past 2 years)        Cholesterol   HDL   LDL   Direct LDL   Triglycerides      01/04/21 1515 215   32   120  Comment: <100 mg/dL, Optimal  100-129 mg/dL, Near/Above Optimal  130-159 mg/dL, Borderline High  160-189 mg/dL, High  >=190 mg/dL, Very high     316             Diabetes Screening  yearly for those at risk for diabetes, 2 tests per year for those with prediabetes Last Glucose: 94    Diabetes Self Management Training or Medical Nutrition Therapy  For those with diabetes, up to 10 hrs initial training within a year, subsequent years up to 2 hrs of follow up training Optional for those with diabetes     Medical Nutrition Therapy Three hours of one-on-one counseling in first year, two hours in subsequent years Optional for those with diabetes, kidney disease   Intensive Behavioral Therapy for Obesity  Face-to-face counseling, first month every week, month 2-6 every other week, month 7-12 every month if continued progress is documented Optional for those with Body Mass Index 30 or higher  Your Body mass index is 28.9 kg/m.   Tobacco Cessation (Quitting) Counseling   Two attempts per year, max 4 sessions per attempt, up to 8 per year, for those with tobacco-related health condition Optional for those that use tobacco   Cancer Screening   Colorectal screening   For anyone age 64 to 31 or any age if high risk:  Screening Colonoscopy every 10 yrs if low risk,  more frequent if higher risk  OR  Cologuard Stool DNA test once every 3 years OR  Fecal Occult Blood Testing yearly OR  Flexible  Sigmoidoscopy  every 5 yr OR  CT Colonography every 5 yrs    See your schedule below   Screening Pap Test Recommended every 3 years for all women age 25 to 46, or every five years if combined with HPV  test (routine screening not needed after total hysterectomy).  Medicare covers every 2 years, up to yearly if high risk.  Screening Pelvic Exam Medicare covers every 2 years, yearly if high risk or childbearing age with abnormal Pap in last 3 yrs. See your schedule below   Screening Mammogram   Recommended every 2 years for women age 55 to 18, or more frequent if you have a higher risk. Selectively recommended for women between 40-49 based on shared decisions about risk. Covered by Medicare up to every year for women age 63 or older See your schedule below   Lung Cancer Screening  Annual low dose computed tomography (LDCT scan) is recommended for those age 63-77 who smoked 20 pack-years and are current smokers or quit smoking within past 15 years (one pack-year= smoking one PPD for one year), after counseling by your doctor or nurse clinician about the possible benefits or harms. See your schedule below   Vaccinations   Pneumococcal Vaccine: Recommended routinely age 42+ with one or two separate vaccines based on your risk    Recommended before age 75 if medical conditions with increased risk  Seasonal Influenza Vaccine: Once every flu season   Hepatitis B Vaccine: 3  doses if risk (including anyone with diabetes or liver disease)  Shingles Vaccine: Two doses at age 27 or older  Diphtheria Tetanus Pertussis Vaccine: ONCE as adult, booster every 10 years     Immunization History   Administered Date(s) Administered    HEPATITIS A VACCINE -ADULT 08/05/2017     Shingles vaccine and Diphtheria Tetanus Pertussis vaccines are available at pharmacies or local health department without a prescription.   Other Screening   Bone Densitometry   Every 24 months for anyone at risk, including postmenopausal       Glaucoma Screening   Yearly if in high risk group such as diabetes, family history, African American age 2+ or Hispanic American age 80+      Hepatitis C Screening recommended ONCE for those born between 1945-1965, or high  risk for HCV infection       HIV Testing recommended routinely at least ONCE, covered every year for age 27 to 3 regardless of risk, and every year for age over 32 who ask for the test or higher risk  Yearly or up to 3 times in pregnancy         Abdominal Aortic Aneurysm Screening Ultrasound   Once between the age of 68-75 with a family history of AAA       Your Personalized Schedule for Preventive Tests     Health Maintenance: Pending and Last Completed         Date Due Completion Date    Osteoporosis screening Never done ---    Hepatitis C screening Never done ---    Covid-19 Vaccine (1) Never done ---    Pneumococcal Vaccination, Age 53+ (1 - PCV) Never done ---    Adult Tdap-Td (1 - Tdap) Never done ---    Colonoscopy Never done ---    Mammography Never done ---    Shingles Vaccine (1 of 2) Never done ---    Influenza Vaccine (1) Never done ---    Annual Wellness Visit - Calendar Year [employer] 04/14/2021 05/17/2020    Depression Screening 05/20/2022 05/20/2021

## 2021-05-21 ENCOUNTER — Other Ambulatory Visit (RURAL_HEALTH_CENTER): Payer: Self-pay | Admitting: Family Medicine

## 2021-05-21 ENCOUNTER — Telehealth (RURAL_HEALTH_CENTER): Payer: Self-pay | Admitting: Family Medicine

## 2021-05-21 DIAGNOSIS — F411 Generalized anxiety disorder: Secondary | ICD-10-CM

## 2021-05-21 MED ORDER — CLONAZEPAM 1 MG TABLET
1.0000 mg | ORAL_TABLET | Freq: Three times a day (TID) | ORAL | 3 refills | Status: DC
Start: 2021-05-21 — End: 2021-06-18

## 2021-05-21 NOTE — Telephone Encounter (Signed)
Patient reports, [facility] unable to fill her [facility].  She requests [facility]

## 2021-05-30 ENCOUNTER — Other Ambulatory Visit (RURAL_HEALTH_CENTER): Payer: Self-pay | Admitting: Family Medicine

## 2021-05-30 DIAGNOSIS — K219 Gastro-esophageal reflux disease without esophagitis: Secondary | ICD-10-CM

## 2021-05-30 DIAGNOSIS — I10 Essential (primary) hypertension: Secondary | ICD-10-CM

## 2021-05-30 DIAGNOSIS — E039 Hypothyroidism, unspecified: Secondary | ICD-10-CM

## 2021-06-08 ENCOUNTER — Other Ambulatory Visit (RURAL_HEALTH_CENTER): Payer: Self-pay | Admitting: Physician Assistant

## 2021-06-08 DIAGNOSIS — I10 Essential (primary) hypertension: Secondary | ICD-10-CM

## 2021-06-08 DIAGNOSIS — I509 Heart failure, unspecified: Secondary | ICD-10-CM

## 2021-06-13 ENCOUNTER — Ambulatory Visit (INDEPENDENT_AMBULATORY_CARE_PROVIDER_SITE_OTHER): Payer: Self-pay | Admitting: Surgery

## 2021-06-18 ENCOUNTER — Other Ambulatory Visit (RURAL_HEALTH_CENTER): Payer: Self-pay | Admitting: Family Medicine

## 2021-06-18 DIAGNOSIS — F411 Generalized anxiety disorder: Secondary | ICD-10-CM

## 2021-06-18 MED ORDER — CLONAZEPAM 1 MG TABLET
1.0000 mg | ORAL_TABLET | Freq: Three times a day (TID) | ORAL | 2 refills | Status: DC
Start: 2021-06-18 — End: 2021-09-10

## 2021-09-06 ENCOUNTER — Ambulatory Visit (HOSPITAL_COMMUNITY): Payer: Self-pay

## 2021-09-10 ENCOUNTER — Other Ambulatory Visit (RURAL_HEALTH_CENTER): Payer: Self-pay | Admitting: Family Medicine

## 2021-09-10 DIAGNOSIS — F411 Generalized anxiety disorder: Secondary | ICD-10-CM

## 2021-09-10 MED ORDER — CLONAZEPAM 1 MG TABLET
1.0000 mg | ORAL_TABLET | Freq: Three times a day (TID) | ORAL | 0 refills | Status: DC
Start: 2021-09-10 — End: 2021-09-10

## 2021-09-10 MED ORDER — CLONAZEPAM 1 MG TABLET
1.0000 mg | ORAL_TABLET | Freq: Three times a day (TID) | ORAL | 0 refills | Status: DC
Start: 2021-09-10 — End: 2021-10-03

## 2021-09-17 ENCOUNTER — Ambulatory Visit (RURAL_HEALTH_CENTER): Payer: Self-pay | Admitting: Family Medicine

## 2021-09-27 ENCOUNTER — Other Ambulatory Visit (RURAL_HEALTH_CENTER): Payer: Self-pay | Admitting: Family Medicine

## 2021-09-27 DIAGNOSIS — E039 Hypothyroidism, unspecified: Secondary | ICD-10-CM

## 2021-09-27 DIAGNOSIS — K219 Gastro-esophageal reflux disease without esophagitis: Secondary | ICD-10-CM

## 2021-09-27 DIAGNOSIS — I10 Essential (primary) hypertension: Secondary | ICD-10-CM

## 2021-10-03 ENCOUNTER — Other Ambulatory Visit: Payer: Self-pay

## 2021-10-03 ENCOUNTER — Ambulatory Visit: Payer: Commercial Managed Care - PPO | Attending: Family Medicine | Admitting: Family Medicine

## 2021-10-03 ENCOUNTER — Encounter (RURAL_HEALTH_CENTER): Payer: Self-pay | Admitting: Family Medicine

## 2021-10-03 VITALS — BP 110/70 | HR 74 | Temp 97.1°F | Resp 17 | Ht 67.99 in | Wt 194.2 lb

## 2021-10-03 DIAGNOSIS — Z79890 Hormone replacement therapy (postmenopausal): Secondary | ICD-10-CM | POA: Insufficient documentation

## 2021-10-03 DIAGNOSIS — F411 Generalized anxiety disorder: Secondary | ICD-10-CM | POA: Insufficient documentation

## 2021-10-03 DIAGNOSIS — K219 Gastro-esophageal reflux disease without esophagitis: Secondary | ICD-10-CM | POA: Insufficient documentation

## 2021-10-03 DIAGNOSIS — F17210 Nicotine dependence, cigarettes, uncomplicated: Secondary | ICD-10-CM | POA: Insufficient documentation

## 2021-10-03 DIAGNOSIS — J449 Chronic obstructive pulmonary disease, unspecified: Secondary | ICD-10-CM | POA: Insufficient documentation

## 2021-10-03 DIAGNOSIS — I2510 Atherosclerotic heart disease of native coronary artery without angina pectoris: Secondary | ICD-10-CM | POA: Insufficient documentation

## 2021-10-03 DIAGNOSIS — I10 Essential (primary) hypertension: Secondary | ICD-10-CM | POA: Insufficient documentation

## 2021-10-03 DIAGNOSIS — M25471 Effusion, right ankle: Secondary | ICD-10-CM | POA: Insufficient documentation

## 2021-10-03 DIAGNOSIS — E782 Mixed hyperlipidemia: Secondary | ICD-10-CM | POA: Insufficient documentation

## 2021-10-03 DIAGNOSIS — G4762 Sleep related leg cramps: Secondary | ICD-10-CM | POA: Insufficient documentation

## 2021-10-03 DIAGNOSIS — Z79899 Other long term (current) drug therapy: Secondary | ICD-10-CM | POA: Insufficient documentation

## 2021-10-03 DIAGNOSIS — R5383 Other fatigue: Secondary | ICD-10-CM | POA: Insufficient documentation

## 2021-10-03 DIAGNOSIS — E039 Hypothyroidism, unspecified: Secondary | ICD-10-CM | POA: Insufficient documentation

## 2021-10-03 DIAGNOSIS — Z955 Presence of coronary angioplasty implant and graft: Secondary | ICD-10-CM | POA: Insufficient documentation

## 2021-10-03 DIAGNOSIS — J309 Allergic rhinitis, unspecified: Secondary | ICD-10-CM | POA: Insufficient documentation

## 2021-10-03 MED ORDER — CLONAZEPAM 1 MG TABLET
1.0000 mg | ORAL_TABLET | Freq: Three times a day (TID) | ORAL | 3 refills | Status: DC
Start: 2021-10-03 — End: 2022-01-17

## 2021-10-03 NOTE — Progress Notes (Signed)
[facility]  [address] [place]  [place] [address]  [phone]   Progress Note    Date of Service:  10/03/2021  [name], [name], 66 y.o. female  Date of Birth:  06-16-1955  PCP: [name] Loa, MD    Chief Complaint:  Follow Up 3 Months       HPI:  [name] [name] is a 66 y.o. White female who is seen in the [facility] patient is here for routine follow-up.  The patient has seen Cardiology and they are talking about scheduling a stress test but it has been about a month and she has not heard back yet.  She had an echo done awhile back which showed fairly good heart function and no significant valvular heart disease.  She continues have shortness of breath but does continue to smoke still.  Patient has not had any significant cramps in her legs.  She is not had any palpitations PND orthopnea.  She is still taking her nerve medicine and that seems to work well but needs refill.  Her reflux is been fairly quiet she is not had any significant exacerbations with that.  She is had little bit of swelling off and on particularly in the right leg but that comes and goes.  Her nocturnal leg cramps have been fairly quiet but she takes her medicine regularly for that as well.  Her anxiety is mentions stable.  Patient is having no significant productive cough.  She is not had any significant shortness of breath at rest but does have a dyspnea exertion still.  Patient continues with her regular treatments and is not had any significant problems with her thyroid dosing.  The patient has had no significant palpitations no fatigue no hair loss no heat intolerance or cold intolerance.      Medications reconciled and current.     Past Medical History:   Diagnosis Date   . CAD (coronary artery disease) 05/05/2018   . CHF (congestive heart failure) (CMS HCC) 05/05/2018   . COPD (chronic obstructive pulmonary disease) (CMS [facility])  05/05/2018   . GAD (generalized anxiety disorder) 05/05/2018   . GERD (gastroesophageal reflux disease) 05/05/2018   . History of heart artery stent 05/05/2018   . Hyperlipidemia 05/05/2018   . Hypertension 05/05/2018   . Hypothyroidism 05/05/2018   . Nicotine dependence, cigarettes, uncomplicated 6/56/8127   . Right shoulder pain 05/05/2018      Past Surgical History:   Procedure Laterality Date   . CORONARY ARTERY ANGIOPLASTY     . HX APPENDECTOMY     . HX CESAREAN SECTION     . HX HEART CATHETERIZATION     . LEG SURGERY Right       Social History     Tobacco Use   . Smoking status: Every Day   . Smokeless tobacco: Never   . Tobacco comments:     one pack daily since age 74   Vaping Use   . Vaping Use: Never used   Substance Use Topics   . Alcohol use: Yes     Comment: occasional   . Drug use: Never       Family Medical History:     Problem Relation (Age of Onset)    Cancer Other    Diabetes Other    Lymphoma Other         Outpatient Medications Marked as Taking for the  10/03/21 encounter (Office Visit) with [name] Loa, MD   Medication Sig   . atorvastatin (LIPITOR) 80 mg Oral Tablet Take 1 tablet by mouth once daily Indications: high cholesterol and high triglycerides, treatment to prevent a heart attack   . carBAMazepine (TEGRETOL XR) 100 mg Oral Tablet Sustained Release 12 hr Take 1 Tablet (100 mg total) by mouth Twice daily   . carvediloL (COREG) 6.25 mg Oral Tablet Take 1 tablet by mouth twice daily with food   . clonazePAM (KLONOPIN) 1 mg Oral Tablet Take 1 Tablet (1 mg total) by mouth Three times a day   . escitalopram oxalate (LEXAPRO) 10 mg Oral Tablet Take 1 Tablet (10 mg total) by mouth Once a day   . furosemide (LASIX) 20 mg Oral Tablet Take 1 Tablet (20 mg total) by mouth Once a day as needed.   . Ibuprofen (MOTRIN) 600 mg Oral Tablet Take 1 Tablet (600 mg total) by mouth Three times a day as needed for Pain   . levothyroxine (SYNTHROID) 112 mcg Oral Tablet TAKE 1 TABLET BY MOUTH IN THE MORNING   .  lisinopriL (PRINIVIL) 10 mg Oral Tablet Take 1 tablet by mouth once daily   . loratadine (CLARITIN) 10 mg Oral Tablet Take 1 Tablet (10 mg total) by mouth Once a day   . nitroGLYCERIN (NITROSTAT) 0.4 mg Sublingual Tablet, Sublingual DISSOLVE ONE TABLET UNDER THE TONGUE EVERY 5 MINUTES AS NEEDED FOR CHEST PAIN.  DO NOT EXCEED A TOTAL OF 3 DOSES IN 15 MINUTES   . pantoprazole (PROTONIX) 40 mg Oral Tablet, Delayed Release (E.C.) Take 1 tablet by mouth once daily      Allergies   Allergen Reactions   . Sulfa (Sulfonamides)         Review of Systems:  Positive findings addressed in HPI      PHYSICAL EXAM:   The patient appears to be in no acute distress.  Vitals: BP 110/70   Pulse 74   Temp 36.2 C (97.1 F)   Resp 17   Ht 1.727 m (5' 7.99")   Wt 88.1 kg (194 lb 3.2 oz)   SpO2 97%   BMI 29.54 kg/m       HEENT:   Head Normocephalic. No masses, lesions, tenderness or abnormalities  Eyes: PERRLA, EOMI, conjunctiva wnl.    Ears: Bilateral TMs intact and clear.  Nose: Bilateral nares patent  Throat: Pharynx clear. No lymphadenopathy or thyromegaly.  RESPIRATORY: CTA without wheezes or rales current, no resp distress, equal BS  HEART: Regular rate and rhythm without ectopy or pause, no murmur, trace right lower extremity edema, pedal pulses 2+  GI:  BS present in 4 quadrants.  No tenderness, masses, organomegaly.  Abdomen soft.  Markedly to morbidly obese without read  EXTREMITIES:  Trace right lower extremity edema, erythema or tenderness  NEURO: AAOx3, CN's intact, DTR's 2/4 throughout, Romberg not tested    Data Review:  Pertinent laboratory data and imaging studies reviewed.      Assessment/Plan:    ICD-10-CM    1. Coronary artery disease, unspecified vessel or lesion type, unspecified whether angina present, unspecified whether native or transplanted heart  I25.10       2. Mixed hyperlipidemia  E78.2 LIPID PANEL      3. Primary hypertension  I10 COMPREHENSIVE METABOLIC PNL, FASTING     URINALYSIS, MACROSCOPIC AND  MICROSCOPIC      4. History of heart artery stent  Z95.5  5. Chronic obstructive pulmonary disease, unspecified COPD type (CMS HCC)  J44.9 XR CHEST PA AND LATERAL      6. Nicotine dependence, cigarettes, uncomplicated  Q19.758       7. Gastroesophageal reflux disease, unspecified whether esophagitis present  K21.9       8. Hypothyroidism, unspecified type  E03.9 THYROID STIMULATING HORMONE (SENSITIVE TSH)      9. GAD (generalized anxiety disorder)  F41.1 clonazePAM (KLONOPIN) 1 mg Oral Tablet      10. Allergic rhinitis  J30.9       11. Fatigue, unspecified type  R53.83 CBC/DIFF      12. Right ankle swelling  M25.471            Return in about 4 months (around 02/02/2022).      Overall the patient is doing relatively well.  The patient is taking her Protonix 40 mg daily and is doing well.  Taking Coreg 6.25 mg twice daily she takes furosemide 20 mg daily lisinopril 10 mg daily for heart blood pressure.  She is not had any history of CHF documented here.  Patient has the Claritin 10 for her allergies.  Patient is on the Synthroid 112 mcg daily and will continue the same.  Patient is taking Lexapro 10 for mood and does well along with the Klonopin 1 mg 3 times daily for anxiety.  Patient's taking Tegretol 100 mg twice daily and is doing well.  The patient has had the cramps with this her good.  She is taking atorvastatin 80 mg daily.  Patient will have a slip for blood work including CBC comprehensive lipid and thyroid and UA.  Patient is going to have a chest x-ray because of her continued shortness breath and she will otherwise return to our office in 4 months unless she has further problems or labs or symptoms dictate a sooner follow-up is needed.      Patient advised on the adverse effects of tobacco.  Patient was instructed of the benefits of quitting.  Patient was provided the opportunity to be referred to tobacco dependence groups if patient would like and patient was also offered nicotine replacement  products.  I have spent 5 minutes counseling the patient.    This is an established Patient. Pt has been seen in last 3 years.   [name] Loa, MD

## 2021-10-04 ENCOUNTER — Ambulatory Visit (RURAL_HEALTH_CENTER): Payer: Self-pay | Admitting: Family Medicine

## 2021-11-15 ENCOUNTER — Ambulatory Visit (HOSPITAL_COMMUNITY): Payer: Self-pay

## 2021-12-02 ENCOUNTER — Other Ambulatory Visit (RURAL_HEALTH_CENTER): Payer: Self-pay | Admitting: Family Medicine

## 2021-12-02 DIAGNOSIS — I509 Heart failure, unspecified: Secondary | ICD-10-CM

## 2021-12-02 DIAGNOSIS — I10 Essential (primary) hypertension: Secondary | ICD-10-CM

## 2021-12-03 ENCOUNTER — Other Ambulatory Visit: Payer: Commercial Managed Care - PPO | Attending: Family Medicine

## 2021-12-03 ENCOUNTER — Other Ambulatory Visit (RURAL_HEALTH_CENTER): Payer: Self-pay | Admitting: Family Medicine

## 2021-12-03 ENCOUNTER — Other Ambulatory Visit: Payer: Self-pay

## 2021-12-03 ENCOUNTER — Inpatient Hospital Stay (HOSPITAL_COMMUNITY)
Admission: RE | Admit: 2021-12-03 | Discharge: 2021-12-03 | Disposition: A | Discharge status: Home / Self Care | Payer: Commercial Managed Care - PPO | Source: Ambulatory Visit | Attending: Family Medicine | Admitting: Family Medicine

## 2021-12-03 DIAGNOSIS — E782 Mixed hyperlipidemia: Secondary | ICD-10-CM | POA: Insufficient documentation

## 2021-12-03 DIAGNOSIS — R5383 Other fatigue: Secondary | ICD-10-CM | POA: Insufficient documentation

## 2021-12-03 DIAGNOSIS — I10 Essential (primary) hypertension: Secondary | ICD-10-CM | POA: Insufficient documentation

## 2021-12-03 DIAGNOSIS — E039 Hypothyroidism, unspecified: Secondary | ICD-10-CM | POA: Insufficient documentation

## 2021-12-03 DIAGNOSIS — J449 Chronic obstructive pulmonary disease, unspecified: Secondary | ICD-10-CM | POA: Insufficient documentation

## 2021-12-03 LAB — COMPREHENSIVE METABOLIC PNL, FASTING
ALBUMIN: 4.1 g/dL (ref 3.4–4.8)
ALKALINE PHOSPHATASE: 124 U/L (ref 55–145)
ALT (SGPT): 17 U/L (ref 8–22)
ANION GAP: 9 mmol/L (ref 4–13)
AST (SGOT): 14 U/L (ref 8–45)
BILIRUBIN TOTAL: 0.4 mg/dL (ref 0.3–1.3)
BUN/CREA RATIO: 14 (ref 6–22)
BUN: 13 mg/dL (ref 8–25)
CALCIUM: 9.8 mg/dL (ref 8.8–10.2)
CHLORIDE: 105 mmol/L (ref 96–111)
CO2 TOTAL: 27 mmol/L (ref 23–31)
CREATININE: 0.93 mg/dL (ref 0.60–1.05)
ESTIMATED GFR: 67 mL/min/BSA (ref >=60)
GLUCOSE: 132 mg/dL — ABNORMAL HIGH (ref 70–99)
POTASSIUM: 4.2 mmol/L (ref 3.5–5.1)
PROTEIN TOTAL: 7.9 g/dL (ref 6.0–8.0)
SODIUM: 141 mmol/L (ref 136–145)

## 2021-12-03 LAB — CBC WITH DIFF
BASOPHIL #: 0.12 10*3/uL (ref <=0.20)
BASOPHIL %: 1 %
EOSINOPHIL #: <0.1 10*3/uL (ref <=0.50)
EOSINOPHIL %: 1 %
HCT: 42.9 % (ref 34.8–46.0)
HGB: 14.4 g/dL (ref 11.5–16.0)
IMMATURE GRANULOCYTE #: <0.1 10*3/uL (ref <0.10)
IMMATURE GRANULOCYTE %: 1 % (ref 0–1)
LYMPHOCYTE #: 2.32 10*3/uL (ref 1.00–4.80)
LYMPHOCYTE %: 28 %
MCH: 31.7 pg (ref 26.0–32.0)
MCHC: 33.6 g/dL (ref 31.0–35.5)
MCV: 94.5 fL (ref 78.0–100.0)
MONOCYTE #: 0.58 10*3/uL (ref 0.20–1.10)
MONOCYTE %: 7 %
MPV: 11 fL (ref 8.7–12.5)
NEUTROPHIL #: 5.29 10*3/uL (ref 1.50–7.70)
NEUTROPHIL %: 62 %
PLATELETS: 270 10*3/uL (ref 150–400)
RBC: 4.54 10*6/uL (ref 3.85–5.22)
RDW-CV: 12.7 % (ref 11.5–15.5)
WBC: 8.4 10*3/uL (ref 3.7–11.0)

## 2021-12-03 LAB — URINALYSIS, MACROSCOPIC
BILIRUBIN: NEGATIVE mg/dL
BLOOD: NEGATIVE mg/dL
GLUCOSE: NEGATIVE mg/dL
KETONES: NEGATIVE mg/dL
LEUKOCYTES: NEGATIVE WBCs/uL
NITRITE: NEGATIVE
PH: 5.5 (ref 5.0–8.5)
PROTEIN: NEGATIVE mg/dL
SPECIFIC GRAVITY: 1.01 (ref 1.005–1.030)
UROBILINOGEN: 0.2 mg/dL

## 2021-12-03 LAB — LIPID PANEL
CHOL/HDL RATIO: 5.9
CHOLESTEROL: 226 mg/dL — ABNORMAL HIGH (ref 100–200)
HDL CHOL: 38 mg/dL — ABNORMAL LOW (ref >=50)
LDL CALC: 152 mg/dL — ABNORMAL HIGH (ref <100)
NON-HDL: 188 mg/dL (ref <=190)
TRIGLYCERIDES: 198 mg/dL — ABNORMAL HIGH (ref <150)
VLDL CALC: 37 mg/dL — ABNORMAL HIGH (ref <30)

## 2021-12-03 LAB — URINALYSIS, MICROSCOPIC

## 2021-12-03 LAB — THYROID STIMULATING HORMONE (SENSITIVE TSH): TSH: 1.219 u[IU]/mL (ref 0.430–3.550)

## 2021-12-03 MED ORDER — EZETIMIBE 10 MG TABLET
10.0000 mg | ORAL_TABLET | Freq: Every evening | ORAL | 4 refills | Status: DC
Start: 2021-12-03 — End: 2022-12-23

## 2021-12-22 ENCOUNTER — Other Ambulatory Visit (RURAL_HEALTH_CENTER): Payer: Self-pay | Admitting: Nurse Practitioner

## 2021-12-22 DIAGNOSIS — E039 Hypothyroidism, unspecified: Secondary | ICD-10-CM

## 2021-12-23 NOTE — Telephone Encounter (Signed)
Pharmacy requesting refills.

## 2022-01-10 ENCOUNTER — Other Ambulatory Visit (RURAL_HEALTH_CENTER): Payer: Self-pay | Admitting: Nurse Practitioner

## 2022-01-10 DIAGNOSIS — E039 Hypothyroidism, unspecified: Secondary | ICD-10-CM

## 2022-01-10 DIAGNOSIS — I10 Essential (primary) hypertension: Secondary | ICD-10-CM

## 2022-01-10 DIAGNOSIS — K219 Gastro-esophageal reflux disease without esophagitis: Secondary | ICD-10-CM

## 2022-01-10 NOTE — Telephone Encounter (Signed)
Pharmacy requesting refills.

## 2022-01-16 ENCOUNTER — Encounter (HOSPITAL_COMMUNITY): Payer: Self-pay

## 2022-01-16 ENCOUNTER — Inpatient Hospital Stay
Admission: RE | Admit: 2022-01-16 | Discharge: 2022-01-16 | Disposition: A | Discharge status: Home / Self Care | Payer: Commercial Managed Care - PPO | Source: Ambulatory Visit | Attending: Family Medicine | Admitting: Family Medicine

## 2022-01-16 ENCOUNTER — Other Ambulatory Visit: Payer: Self-pay

## 2022-01-16 DIAGNOSIS — Z1231 Encounter for screening mammogram for malignant neoplasm of breast: Secondary | ICD-10-CM | POA: Insufficient documentation

## 2022-01-16 DIAGNOSIS — Z1239 Encounter for other screening for malignant neoplasm of breast: Secondary | ICD-10-CM

## 2022-01-17 ENCOUNTER — Other Ambulatory Visit (RURAL_HEALTH_CENTER): Payer: Self-pay | Admitting: Family Medicine

## 2022-01-17 DIAGNOSIS — F411 Generalized anxiety disorder: Secondary | ICD-10-CM

## 2022-01-20 ENCOUNTER — Other Ambulatory Visit (RURAL_HEALTH_CENTER): Payer: Self-pay | Admitting: Family Medicine

## 2022-01-20 DIAGNOSIS — Z1231 Encounter for screening mammogram for malignant neoplasm of breast: Secondary | ICD-10-CM

## 2022-01-24 ENCOUNTER — Other Ambulatory Visit (RURAL_HEALTH_CENTER): Payer: Self-pay | Admitting: Family Medicine

## 2022-01-24 DIAGNOSIS — F411 Generalized anxiety disorder: Secondary | ICD-10-CM

## 2022-01-24 MED ORDER — CLONAZEPAM 1 MG TABLET
1.0000 mg | ORAL_TABLET | Freq: Three times a day (TID) | ORAL | 0 refills | Status: DC
Start: 2022-01-24 — End: 2022-02-03

## 2022-01-24 NOTE — Telephone Encounter (Signed)
Pt requested refill be sent to [facility].

## 2022-02-03 ENCOUNTER — Ambulatory Visit: Payer: Commercial Managed Care - PPO | Attending: Family Medicine | Admitting: Family Medicine

## 2022-02-03 ENCOUNTER — Other Ambulatory Visit: Payer: Self-pay

## 2022-02-03 ENCOUNTER — Encounter (RURAL_HEALTH_CENTER): Payer: Self-pay | Admitting: Family Medicine

## 2022-02-03 VITALS — BP 102/65 | HR 89 | Temp 97.7°F | Resp 17 | Ht 67.99 in | Wt 194.8 lb

## 2022-02-03 DIAGNOSIS — E782 Mixed hyperlipidemia: Secondary | ICD-10-CM | POA: Insufficient documentation

## 2022-02-03 DIAGNOSIS — Z79890 Hormone replacement therapy (postmenopausal): Secondary | ICD-10-CM | POA: Insufficient documentation

## 2022-02-03 DIAGNOSIS — R609 Edema, unspecified: Secondary | ICD-10-CM | POA: Insufficient documentation

## 2022-02-03 DIAGNOSIS — E039 Hypothyroidism, unspecified: Secondary | ICD-10-CM | POA: Insufficient documentation

## 2022-02-03 DIAGNOSIS — J309 Allergic rhinitis, unspecified: Secondary | ICD-10-CM | POA: Insufficient documentation

## 2022-02-03 DIAGNOSIS — Z79899 Other long term (current) drug therapy: Secondary | ICD-10-CM | POA: Insufficient documentation

## 2022-02-03 DIAGNOSIS — Z23 Encounter for immunization: Secondary | ICD-10-CM | POA: Insufficient documentation

## 2022-02-03 DIAGNOSIS — I509 Heart failure, unspecified: Secondary | ICD-10-CM | POA: Insufficient documentation

## 2022-02-03 DIAGNOSIS — F17210 Nicotine dependence, cigarettes, uncomplicated: Secondary | ICD-10-CM | POA: Insufficient documentation

## 2022-02-03 DIAGNOSIS — F411 Generalized anxiety disorder: Secondary | ICD-10-CM | POA: Insufficient documentation

## 2022-02-03 DIAGNOSIS — R252 Cramp and spasm: Secondary | ICD-10-CM | POA: Insufficient documentation

## 2022-02-03 DIAGNOSIS — I2510 Atherosclerotic heart disease of native coronary artery without angina pectoris: Secondary | ICD-10-CM | POA: Insufficient documentation

## 2022-02-03 DIAGNOSIS — K219 Gastro-esophageal reflux disease without esophagitis: Secondary | ICD-10-CM | POA: Insufficient documentation

## 2022-02-03 DIAGNOSIS — I10 Essential (primary) hypertension: Secondary | ICD-10-CM | POA: Insufficient documentation

## 2022-02-03 DIAGNOSIS — J449 Chronic obstructive pulmonary disease, unspecified: Secondary | ICD-10-CM | POA: Insufficient documentation

## 2022-02-03 DIAGNOSIS — G4762 Sleep related leg cramps: Secondary | ICD-10-CM | POA: Insufficient documentation

## 2022-02-03 MED ORDER — CLONAZEPAM 1 MG TABLET
1.0000 mg | ORAL_TABLET | Freq: Three times a day (TID) | ORAL | 5 refills | Status: DC
Start: 2022-02-03 — End: 2022-07-05

## 2022-02-03 MED ORDER — FUROSEMIDE 20 MG TABLET
20.0000 mg | ORAL_TABLET | Freq: Two times a day (BID) | ORAL | 5 refills | Status: DC
Start: 2022-02-03 — End: 2022-11-28

## 2022-02-03 NOTE — Progress Notes (Signed)
[facility]  [address] [place]  [place] [address]  [phone]   Progress Note    Date of Service:  02/03/2022  [name] [name], [name] [name], 66 y.o. female  Date of Birth:  December 19, 1955  PCP: Betha Loa, MD    Chief Complaint:  Follow Up 4 Months       HPI:  [name] [name] is a 66 y.o. White female who is seen in the [facility] patient is here for routine 4 month follow-up.  Overall the patient states that she is been doing fairly well.  Her breathing is been about the same she still has dyspnea exertion she has little bit of PND at times but nothing significant.  She has no overt CHF symptoms.  Patient has not had any significant cough or production.  She is not had any significant fever chills or rigors.  The patient is having her nocturnal leg cramps on a limited basis but they are doing much better.  Overall her mood seems to be fairly stable.  She is not had any muscle cramps or problems with her current cholesterol medicine.  Should as needed her anxiety medicine.  Her reflux is mention is stable her thyroids been stable on her current treatment.  Patient is having no significant headache dizziness or vertigo more than her baseline.  Her allergies are overall about the same.  She has no productive cough she has no hemoptysis.  She has no pleuritic pain and she is not having any current fever chills or rigors.  Patient is having no significant nausea vomiting no diarrhea no melena hematochezia.  The patient's swelling is been little bit more in the left leg but not persistent.    Medications reconciled and current.     Past Medical History:   Diagnosis Date    CAD (coronary artery disease) 05/05/2018    CHF (congestive heart failure) (CMS HCC) 05/05/2018    COPD (chronic obstructive pulmonary disease) (CMS HCC) 05/05/2018    GAD (generalized anxiety disorder) 05/05/2018    GERD (gastroesophageal reflux disease)  05/05/2018    History of heart artery stent 05/05/2018    Hyperlipidemia 05/05/2018    Hypertension 05/05/2018    Hypothyroidism 05/05/2018    Nicotine dependence, cigarettes, uncomplicated 0/25/8527    Right shoulder pain 05/05/2018      Past Surgical History:   Procedure Laterality Date    CORONARY ARTERY ANGIOPLASTY      HX APPENDECTOMY      HX CESAREAN SECTION      HX HEART CATHETERIZATION      LEG SURGERY Right       Social History     Tobacco Use    Smoking status: Every Day     Types: Cigarettes     Passive exposure: Current    Smokeless tobacco: Never    Tobacco comments:     one pack daily since age 68   Vaping Use    Vaping Use: Never used   Substance Use Topics    Alcohol use: Yes     Comment: occasional    Drug use: Never       Family Medical History:       Problem Relation (Age of Onset)    Breast Cancer Sister    Cancer Other    Diabetes Other    Lymphoma Other           Outpatient Medications  Marked as Taking for the 02/03/22 encounter (Office Visit) with Betha Loa, MD   Medication Sig    atorvastatin (LIPITOR) 80 mg Oral Tablet Take 1 tablet by mouth once daily Indications: high cholesterol and high triglycerides, treatment to prevent a heart attack    carBAMazepine (TEGRETOL XR) 100 mg Oral Tablet Sustained Release 12 hr Take 1 Tablet (100 mg total) by mouth Twice daily    carvediloL (COREG) 6.25 mg Oral Tablet Take 1 tablet by mouth twice daily with food    clonazePAM (KLONOPIN) 1 mg Oral Tablet Take 1 Tablet (1 mg total) by mouth Three times a day    escitalopram oxalate (LEXAPRO) 10 mg Oral Tablet Take 1 Tablet (10 mg total) by mouth Once a day    ezetimibe (ZETIA) 10 mg Oral Tablet Take 1 Tablet (10 mg total) by mouth Every evening    furosemide (LASIX) 20 mg Oral Tablet Take 1 Tablet (20 mg total) by mouth Twice daily as needed.    Ibuprofen (MOTRIN) 600 mg Oral Tablet Take 1 Tablet (600 mg total) by mouth Three times a day as needed for Pain    levothyroxine (SYNTHROID) 112 mcg Oral Tablet  TAKE 1 TABLET BY MOUTH IN THE MORNING    lisinopriL (PRINIVIL) 10 mg Oral Tablet Take 1 tablet by mouth once daily    loratadine (CLARITIN) 10 mg Oral Tablet Take 1 Tablet (10 mg total) by mouth Once a day    nitroGLYCERIN (NITROSTAT) 0.4 mg Sublingual Tablet, Sublingual DISSOLVE ONE TABLET UNDER THE TONGUE EVERY 5 MINUTES AS NEEDED FOR CHEST PAIN.  DO NOT EXCEED A TOTAL OF 3 DOSES IN 15 MINUTES    pantoprazole (PROTONIX) 40 mg Oral Tablet, Delayed Release (E.C.) Take 1 tablet by mouth once daily      Allergies   Allergen Reactions    Sulfa (Sulfonamides) Hives/ Urticaria        Review of Systems:  Positive findings addressed in HPI      PHYSICAL EXAM:   The patient appears to be in no acute distress.  Vitals: BP 102/65   Pulse 89   Temp 36.5 C (97.7 F) (Temporal)   Resp 17   Ht 1.727 m (5' 7.99")   Wt 88.4 kg (194 lb 12.8 oz)   SpO2 93%   BMI 29.63 kg/m       HEENT:   Head Normocephalic. No masses, lesions, tenderness or abnormalities  Eyes: PERRLA, EOMI, conjunctiva wnl.    Ears: Bilateral TMs intact and clear.  Nose: Bilateral nares patent  Throat: Pharynx clear. No lymphadenopathy or thyromegaly.  RESPIRATORY: CTA slightly decreased but no wheezes or rales, no resp distress, equal BS  HEART: Regular rate and rhythm without ectopy or problems, no murmur, no edema, pedal pulses 2+  GI:  BS present in 4 quadrants.  No tenderness, masses, organomegaly.  Abdomen soft.  Morbidly obese without rebound  EXTREMITIES:  Trace edema, erythema or tenderness  NEURO: AAOx3, CN's intact, DTR's 2/4 throughout, Romberg not tested    Data Review:  Pertinent laboratory data and imaging studies reviewed.      Assessment/Plan:    ICD-10-CM    1. Coronary artery disease, unspecified vessel or lesion type, unspecified whether angina present, unspecified whether native or transplanted heart  I25.10       2. Congestive heart failure, unspecified HF chronicity, unspecified heart failure type (CMS HCC)  I50.9       3. Mixed  hyperlipidemia  E78.2  4. Primary hypertension  I10       5. Nicotine dependence, cigarettes, uncomplicated  H21.224       6. Chronic obstructive pulmonary disease, unspecified COPD type (CMS HCC)  J44.9       7. Gastroesophageal reflux disease, unspecified whether esophagitis present  K21.9       8. Hypothyroidism, unspecified type  E03.9       9. Nocturnal leg cramps  G47.62       10. Allergic rhinitis  J30.9       11. GAD (generalized anxiety disorder)  F41.1 clonazePAM (KLONOPIN) 1 mg Oral Tablet      12. Edema, unspecified type  R60.9            Return in about 4 months (around 06/06/2022).      Overall the patient is doing fairly well.  The patient is having her Lipitor 80 mg daily and takes the Zetia 10 mg along with that.  She goes back see Cardiology but is had her echo which looked okay but is not had her stress test.  Patient takes Tegretol 100 mg twice daily extended release for her muscle cramps which is done well.  Currently she is taking clonazepam 1 mg 3 times daily and is doing okay with her anxiety with that.  In addition to that she takes the Lexapro 10 daily.  Patient's taking her Protonix 40 mg daily for her reflux.  She is taking Prinivil 10 mg daily along with her carvedilol 6.25 mg twice daily for her blood pressure and rate control.  In addition she takes furosemide 20 mg but because the swelling will increase it to twice daily till the swelling gets better.  She will continue her Synthroid 112 mcg daily.  For allergies she continues her Claritin and tolerates without problems.  Patient will continue current treatments.  Her blood works up to order at this point and the patient was given a flu HD shot today.  Patient return to office in 4 months unless she has problems then she would return sooner.    Patient advised on the adverse effects of tobacco.  Patient was instructed of the benefits of quitting.  Patient was provided the opportunity to be referred to tobacco dependence groups if  patient would like and patient was also offered nicotine replacement products.  I have spent 5 minutes counseling the patient.    This is an established Patient. Pt has been seen in last 3 years.   Betha Loa, MD

## 2022-02-03 NOTE — Nursing Note (Signed)
Immunization administered       Name Date Dose VIS Date Route    Influenza Vaccine, 65+ 02/03/2022 0.5 mL 11/18/2019 Intramuscular    Site: Right deltoid    Given By: Renee Harder, LPN    Manufacturer: [employer]    Lot: [id number]    [place]: [id number]

## 2022-03-11 ENCOUNTER — Ambulatory Visit (INDEPENDENT_AMBULATORY_CARE_PROVIDER_SITE_OTHER): Payer: Self-pay | Admitting: Family Medicine

## 2022-03-18 ENCOUNTER — Other Ambulatory Visit (HOSPITAL_COMMUNITY): Payer: Self-pay | Admitting: Family

## 2022-03-18 DIAGNOSIS — I2089 Other forms of angina pectoris: Secondary | ICD-10-CM

## 2022-03-25 ENCOUNTER — Ambulatory Visit (INDEPENDENT_AMBULATORY_CARE_PROVIDER_SITE_OTHER): Payer: Self-pay

## 2022-04-15 ENCOUNTER — Ambulatory Visit (INDEPENDENT_AMBULATORY_CARE_PROVIDER_SITE_OTHER): Payer: Self-pay

## 2022-04-17 ENCOUNTER — Other Ambulatory Visit (RURAL_HEALTH_CENTER): Payer: Self-pay | Admitting: Family Medicine

## 2022-04-17 DIAGNOSIS — E782 Mixed hyperlipidemia: Secondary | ICD-10-CM

## 2022-04-17 DIAGNOSIS — K219 Gastro-esophageal reflux disease without esophagitis: Secondary | ICD-10-CM

## 2022-04-22 ENCOUNTER — Other Ambulatory Visit (INDEPENDENT_AMBULATORY_CARE_PROVIDER_SITE_OTHER): Payer: Commercial Managed Care - PPO | Admitting: NUCLEAR MEDICINE

## 2022-04-22 ENCOUNTER — Ambulatory Visit (INDEPENDENT_AMBULATORY_CARE_PROVIDER_SITE_OTHER): Payer: Commercial Managed Care - PPO

## 2022-04-22 ENCOUNTER — Other Ambulatory Visit (INDEPENDENT_AMBULATORY_CARE_PROVIDER_SITE_OTHER): Payer: Commercial Managed Care - PPO

## 2022-04-22 ENCOUNTER — Other Ambulatory Visit (HOSPITAL_COMMUNITY): Payer: Self-pay | Admitting: Family Medicine

## 2022-04-22 DIAGNOSIS — R6889 Other general symptoms and signs: Secondary | ICD-10-CM

## 2022-05-15 ENCOUNTER — Other Ambulatory Visit (RURAL_HEALTH_CENTER): Payer: Self-pay | Admitting: Family Medicine

## 2022-05-15 DIAGNOSIS — I10 Essential (primary) hypertension: Secondary | ICD-10-CM

## 2022-05-21 ENCOUNTER — Ambulatory Visit (RURAL_HEALTH_CENTER): Payer: Self-pay | Admitting: Family Medicine

## 2022-06-05 ENCOUNTER — Ambulatory Visit (RURAL_HEALTH_CENTER): Payer: Self-pay | Admitting: Family Medicine

## 2022-06-23 ENCOUNTER — Other Ambulatory Visit (INDEPENDENT_AMBULATORY_CARE_PROVIDER_SITE_OTHER): Payer: Commercial Managed Care - PPO

## 2022-06-23 ENCOUNTER — Encounter (RURAL_HEALTH_CENTER): Payer: Self-pay | Admitting: Family Medicine

## 2022-06-23 ENCOUNTER — Other Ambulatory Visit: Payer: Self-pay

## 2022-06-23 ENCOUNTER — Inpatient Hospital Stay
Admission: RE | Admit: 2022-06-23 | Discharge: 2022-06-23 | Disposition: A | Discharge status: Home / Self Care | Payer: Commercial Managed Care - PPO | Source: Ambulatory Visit | Attending: Family Medicine | Admitting: Family Medicine

## 2022-06-23 ENCOUNTER — Ambulatory Visit: Payer: Commercial Managed Care - PPO | Attending: Family Medicine | Admitting: Family Medicine

## 2022-06-23 VITALS — BP 97/60 | HR 70 | Temp 97.4°F | Resp 17 | Ht 67.99 in | Wt 190.8 lb

## 2022-06-23 DIAGNOSIS — Z955 Presence of coronary angioplasty implant and graft: Secondary | ICD-10-CM | POA: Insufficient documentation

## 2022-06-23 DIAGNOSIS — R5383 Other fatigue: Secondary | ICD-10-CM

## 2022-06-23 DIAGNOSIS — I10 Essential (primary) hypertension: Secondary | ICD-10-CM

## 2022-06-23 DIAGNOSIS — I2510 Atherosclerotic heart disease of native coronary artery without angina pectoris: Secondary | ICD-10-CM

## 2022-06-23 DIAGNOSIS — J309 Allergic rhinitis, unspecified: Secondary | ICD-10-CM

## 2022-06-23 DIAGNOSIS — E782 Mixed hyperlipidemia: Secondary | ICD-10-CM | POA: Insufficient documentation

## 2022-06-23 DIAGNOSIS — E039 Hypothyroidism, unspecified: Secondary | ICD-10-CM | POA: Insufficient documentation

## 2022-06-23 DIAGNOSIS — J449 Chronic obstructive pulmonary disease, unspecified: Secondary | ICD-10-CM | POA: Insufficient documentation

## 2022-06-23 DIAGNOSIS — G8929 Other chronic pain: Secondary | ICD-10-CM | POA: Insufficient documentation

## 2022-06-23 DIAGNOSIS — M25511 Pain in right shoulder: Secondary | ICD-10-CM | POA: Insufficient documentation

## 2022-06-23 DIAGNOSIS — F411 Generalized anxiety disorder: Secondary | ICD-10-CM | POA: Insufficient documentation

## 2022-06-23 DIAGNOSIS — Z79890 Hormone replacement therapy (postmenopausal): Secondary | ICD-10-CM | POA: Insufficient documentation

## 2022-06-23 DIAGNOSIS — Z0000 Encounter for general adult medical examination without abnormal findings: Secondary | ICD-10-CM

## 2022-06-23 DIAGNOSIS — M62838 Other muscle spasm: Secondary | ICD-10-CM | POA: Insufficient documentation

## 2022-06-23 DIAGNOSIS — F17210 Nicotine dependence, cigarettes, uncomplicated: Secondary | ICD-10-CM | POA: Insufficient documentation

## 2022-06-23 DIAGNOSIS — M25512 Pain in left shoulder: Secondary | ICD-10-CM | POA: Insufficient documentation

## 2022-06-23 DIAGNOSIS — M159 Polyosteoarthritis, unspecified: Secondary | ICD-10-CM

## 2022-06-23 DIAGNOSIS — Z791 Long term (current) use of non-steroidal anti-inflammatories (NSAID): Secondary | ICD-10-CM | POA: Insufficient documentation

## 2022-06-23 DIAGNOSIS — Z0001 Encounter for general adult medical examination with abnormal findings: Secondary | ICD-10-CM | POA: Insufficient documentation

## 2022-06-23 DIAGNOSIS — F39 Unspecified mood [affective] disorder: Secondary | ICD-10-CM | POA: Insufficient documentation

## 2022-06-23 DIAGNOSIS — I509 Heart failure, unspecified: Secondary | ICD-10-CM | POA: Insufficient documentation

## 2022-06-23 DIAGNOSIS — K219 Gastro-esophageal reflux disease without esophagitis: Secondary | ICD-10-CM | POA: Insufficient documentation

## 2022-06-23 DIAGNOSIS — Z79899 Other long term (current) drug therapy: Secondary | ICD-10-CM | POA: Insufficient documentation

## 2022-06-23 DIAGNOSIS — I252 Old myocardial infarction: Secondary | ICD-10-CM | POA: Insufficient documentation

## 2022-06-23 DIAGNOSIS — D1723 Benign lipomatous neoplasm of skin and subcutaneous tissue of right leg: Secondary | ICD-10-CM

## 2022-06-23 DIAGNOSIS — I110 Hypertensive heart disease with heart failure: Secondary | ICD-10-CM | POA: Insufficient documentation

## 2022-06-23 LAB — COMPREHENSIVE METABOLIC PNL, FASTING
ALBUMIN: 3.9 g/dL (ref 3.4–4.8)
ALKALINE PHOSPHATASE: 115 U/L (ref 55–145)
ALT (SGPT): 16 U/L (ref 8–22)
ANION GAP: 8 mmol/L (ref 4–13)
AST (SGOT): 15 U/L (ref 8–45)
BILIRUBIN TOTAL: 0.6 mg/dL (ref 0.3–1.3)
BUN/CREA RATIO: 16 (ref 6–22)
BUN: 15 mg/dL (ref 8–25)
CALCIUM: 8.8 mg/dL (ref 8.6–10.3)
CHLORIDE: 108 mmol/L (ref 96–111)
CO2 TOTAL: 25 mmol/L (ref 23–31)
CREATININE: 0.92 mg/dL (ref 0.60–1.05)
ESTIMATED GFR - FEMALE: 68 mL/min/BSA (ref >=60)
GLUCOSE: 116 mg/dL — ABNORMAL HIGH (ref 70–99)
POTASSIUM: 3.5 mmol/L (ref 3.5–5.1)
PROTEIN TOTAL: 7.5 g/dL (ref 6.0–8.0)
SODIUM: 141 mmol/L (ref 136–145)

## 2022-06-23 LAB — LIPID PANEL
CHOL/HDL RATIO: 4.3
CHOLESTEROL: 145 mg/dL (ref 100–200)
HDL CHOL: 34 mg/dL — ABNORMAL LOW (ref >=50)
LDL CALC: 83 mg/dL (ref <100)
NON-HDL: 111 mg/dL (ref <=190)
TRIGLYCERIDES: 162 mg/dL — ABNORMAL HIGH (ref <150)
VLDL CALC: 25 mg/dL (ref <30)

## 2022-06-23 LAB — CBC WITH DIFF
BASOPHIL #: 0.1 10*3/uL (ref <=0.20)
BASOPHIL %: 1 %
EOSINOPHIL #: <0.1 10*3/uL (ref <=0.50)
EOSINOPHIL %: 1 %
HCT: 43.4 % (ref 34.8–46.0)
HGB: 14.6 g/dL (ref 11.5–16.0)
IMMATURE GRANULOCYTE #: <0.1 10*3/uL (ref <0.10)
IMMATURE GRANULOCYTE %: 0 % (ref 0.0–1.0)
LYMPHOCYTE #: 2.39 10*3/uL (ref 1.00–4.80)
LYMPHOCYTE %: 28 %
MCH: 32.2 pg — ABNORMAL HIGH (ref 26.0–32.0)
MCHC: 33.6 g/dL (ref 31.0–35.5)
MCV: 95.6 fL (ref 78.0–100.0)
MONOCYTE #: 0.56 10*3/uL (ref 0.20–1.10)
MONOCYTE %: 7 %
MPV: 12.3 fL (ref 8.7–12.5)
NEUTROPHIL #: 5.34 10*3/uL (ref 1.50–7.70)
NEUTROPHIL %: 63 %
PLATELETS: 262 10*3/uL (ref 150–400)
RBC: 4.54 10*6/uL (ref 3.85–5.22)
RDW-CV: 13.2 % (ref 11.5–15.5)
WBC: 8.5 10*3/uL (ref 3.7–11.0)

## 2022-06-23 LAB — THYROID STIMULATING HORMONE (SENSITIVE TSH): TSH: 8.638 u[IU]/mL — ABNORMAL HIGH (ref 0.350–4.940)

## 2022-06-23 NOTE — Progress Notes (Signed)
FAMILY MEDICINE, [place]  [place] [address]  Operated by [facility]  Medicare Annual Wellness Visit    Name: [name] [name] MRN:  [id number]   Date: 06/23/2022 Age: 67 y.o.       SUBJECTIVE:   [name] [name] is a 67 y.o. female for presenting for Medicare Wellness exam.   I have reviewed and reconciled the medication list with the patient today.    Comprehensive Health Assessment:  Paper document [place] reviewed and scanned into medical record    I have reviewed and updated as appropriate the past medical, family and social history. 06/23/2022 as summarized below:  Past Medical History:   Diagnosis Date    CAD (coronary artery disease) 05/05/2018    CHF (congestive heart failure) (CMS HCC) 05/05/2018    COPD (chronic obstructive pulmonary disease) (CMS HCC) 05/05/2018    GAD (generalized anxiety disorder) 05/05/2018    GERD (gastroesophageal reflux disease) 05/05/2018    History of heart artery stent 05/05/2018    Hyperlipidemia 05/05/2018    Hypertension 05/05/2018    Hypothyroidism 05/05/2018    Nicotine dependence, cigarettes, uncomplicated [id number]    Right shoulder pain 05/05/2018     Past Surgical History:   Procedure Laterality Date    Coronary artery angioplasty      Hx appendectomy      Hx cesarean section      Hx heart catheterization      Leg surgery Right      Current Outpatient Medications   Medication Sig    atorvastatin (LIPITOR) 80 mg Oral Tablet Take 1 tablet by mouth once daily    carBAMazepine (TEGRETOL XR) 100 mg Oral Tablet Sustained Release 12 hr Take 1 Tablet (100 mg total) by mouth Twice daily    carvediloL (COREG) 6.25 mg Oral Tablet Take 1 tablet by mouth twice daily with food    clonazePAM (KLONOPIN) 1 mg Oral Tablet Take 1 Tablet (1 mg total) by mouth Three times a day    escitalopram oxalate (LEXAPRO) 10 mg Oral Tablet Take 1 Tablet (10 mg total) by mouth Once a day     ezetimibe (ZETIA) 10 mg Oral Tablet Take 1 Tablet (10 mg total) by mouth Every evening    furosemide (LASIX) 20 mg Oral Tablet Take 1 Tablet (20 mg total) by mouth Twice daily as needed.    Ibuprofen (MOTRIN) 600 mg Oral Tablet Take 1 Tablet (600 mg total) by mouth Three times a day as needed for Pain    levothyroxine (SYNTHROID) 112 mcg Oral Tablet TAKE 1 TABLET BY MOUTH IN THE MORNING    lisinopriL (PRINIVIL) 10 mg Oral Tablet Take 1 tablet by mouth once daily    loratadine (CLARITIN) 10 mg Oral Tablet Take 1 Tablet (10 mg total) by mouth Once a day    nitroGLYCERIN (NITROSTAT) 0.4 mg Sublingual Tablet, Sublingual DISSOLVE ONE TABLET UNDER THE TONGUE EVERY 5 MINUTES AS NEEDED FOR CHEST PAIN.  DO NOT EXCEED A TOTAL OF 3 DOSES IN 15 MINUTES    pantoprazole (PROTONIX) 40 mg Oral Tablet, Delayed Release (E.C.) Take 1 tablet by mouth once daily     Family Medical History:       Problem Relation (Age of Onset)    Breast Cancer Sister    Cancer Other    Diabetes Other    Lymphoma Other            Social  History     Socioeconomic History    Marital status: Widowed   Occupational History    Occupation: Retired   Tobacco Use    Smoking status: Every Day     Current packs/day: 0.75     Average packs/day: 0.8 packs/day for 52.2 years (39.1 ttl pk-yrs)     Types: Cigarettes     Start date: 1972     Passive exposure: Current    Smokeless tobacco: Never   Vaping Use    Vaping status: Never Used   Substance and Sexual Activity    Alcohol use: Yes     Comment: occasional    Drug use: Never    Sexual activity: Yes     Partners: Male     Social Determinants of Health     Financial Resource Strain: Low Risk  (06/23/2022)    Financial Resource Strain     SDOH Financial: No   Transportation Needs: Low Risk  (06/23/2022)    Transportation Needs     SDOH Transportation: No   Social Connections: Low Risk  (06/23/2022)    Social Connections     SDOH Social Isolation: 5 or more times a week   Intimate Partner Violence: Low Risk  (06/23/2022)     Intimate Partner Violence     SDOH Domestic Violence: No   Housing Stability: Low Risk  (06/23/2022)    Housing Stability     SDOH Housing Situation: I have housing.     SDOH Housing Worry: No   Health Literacy: Low Risk  (02/03/2022)    Health Literacy     SDOH Health Literacy: Never   Employment Status: Medium Risk (06/23/2022)    Employment Status     SDOH Employment: Part-time or temporary work         List of [place] Team       PCP       Name Type Specialty Phone Number    Betha Loa, MD Physician FAMILY [facility]ICINE [phone]              Care Team       No care team found                      Health Maintenance   Topic Date Due    Osteoporosis screening  Never done    Hepatitis C screening  Never done    Pneumococcal Vaccination, Age 3+ (1 of 2 - PCV) Never done    Adult Tdap-Td (1 - Tdap) Never done    Colonoscopy  Never done    Shingles Vaccine (1 of 2) Never done    Covid-19 Vaccine (1 - 2023-24 season) Never done    Depression Screening  06/23/2023    Mammography  01/17/2024    Influenza Vaccine  Completed    Medicare Annual Wellness Visit - Calendar Year Insurers  Completed    Meningococcal Vaccine  Aged Out     Medicare Wellness Assessment   Medicare initial or wellness physical in the last year?: Yes  Advance Directives   Does patient have a living will or MPOA: Yes   Has patient provided [employer] with a copy?: No   Patient advised to bring in copy.: Yes          Activities of Daily Living   Do you need help with dressing, bathing, or walking?: No   Do you need help with shopping, housekeeping,  medications, or finances?: No   Do you have rugs in hallways, broken steps, or poor lighting?: No   Do you have grab bars in your bathroom, non-slip strips in your tub, and hand rails on your stairs?: Yes   Urinary Incontinence Screen   Do you ever leak urine when you don't want to?: No   Cognitive Function Screen (1=Yes, 0=No)   What is you age?: Correct   What is the  time to the nearest hour?: Correct   What is the year?: Correct   What is the name of this clinic?: Correct   Can the patient recognize two persons (the doctor, the nurse, home help, etc.)?: Correct   What is the date of your birth? (day and month sufficient) : Correct   In what year did World War II end?: Incorrect   Who is the current president of the [place]?: Correct   Count from 20 down to 1?: Correct   What address did I give you earlier?: Correct   Total Score: 9       Fall Risk Screen   Do you feel unsteady when standing or walking?: No  Do you worry about falling?: No  Have you fallen in the past year?: No   Depression Screen     Little interest or pleasure in doing things.: Not at all  Feeling down, depressed, or hopeless: Not at all  PHQ 2 Total: 0     Pain Score   Pain Score:   0 - No pain    Substance Use-Abuse Screening     Tobacco Use     In Past 12 MONTHS, how often have you used any tobacco product (for example, cigarettes, e-cigarettes, cigars, pipes, or smokeless tobacco)?: Daily  In the PAST 3 MONTHS, did you smoke a cigarette containing tobacco or use any other nicotine delivery product (i.e., e-cigarette, vaping or chewing tobacco)?: Yes  In the PAST 3 MONTHS, did you usually smoke more than 10 cigarettes, vape, use an e-cigarette or chew tobacco more than 10 times each day?: Yes  In the PAST 3 MONTHS, did you usually smoke/use an e-cigarette, vape or chew tobacco within 30 minutes after waking?: Yes     Alcohol use     In the PAST 12 MONTHS, how often have you had 5 (men)/4 (women) or more drinks containing alcohol in one day?: Never     Prescription Drug Use     In the PAST 12 months, how often have you used any prescription medications just for the feeling, more than prescribed, or that were not prescribed for you? Prescriptions may include: opioids, benzodiazepines, medications for ADHD: Never           Illicit Drug Use   In the PAST 12 MONTHS, how often have you used any drugs,  including marijuana, cocaine or crack, heroin, methamphetamine, hallucinogens, ecstasy/MDMA?: Never                       OBJECTIVE:   BP 97/60   Pulse 70   Temp 36.3 C (97.4 F) (Temporal)   Resp 17   Ht 1.727 m (5' 7.99")   Wt 86.5 kg (190 lb 12.8 oz)   SpO2 93%   BMI 29.02 kg/m        Other appropriate exam:  HEENT is grossly within normal limits.  Neck is without JVD no carotid bruits no thyromegaly no adenopathy.  Heart is regular rate rhythm there was  no murmur gallop rub no ectopy no pauses.  Lungs show decreased breath sounds but no wheezes or rales noted.  Abdomen was markedly to morbidly obese without organomegaly masses tenderness bowel sounds normal active.  Extremities without cyanosis clubbing or edema.    Health Maintenance Due   Topic Date Due    Osteoporosis screening  Never done    Hepatitis C screening  Never done    Pneumococcal Vaccination, Age 21+ (1 of 2 - PCV) Never done    Adult Tdap-Td (1 - Tdap) Never done    Colonoscopy  Never done    Shingles Vaccine (1 of 2) Never done    Covid-19 Vaccine (1 - 2023-24 season) Never done      ASSESSMENT & PLAN:     ICD-10-CM    1. Primary hypertension  I10 COMPREHENSIVE METABOLIC PNL, FASTING      2. Medicare annual wellness visit, subsequent  Z00.00       3. GAD (generalized anxiety disorder)  F41.1       4. Allergic rhinitis  J30.9       5. Benign lipomatous neoplasm of skin and subcutaneous tissue of right leg  D17.23       6. Generalized osteoarthritis of hand  M15.9       7. Hypothyroidism, unspecified type  E03.9 THYROID STIMULATING HORMONE (SENSITIVE TSH)      8. Gastroesophageal reflux disease, unspecified whether esophagitis present  K21.9       9. Chronic obstructive pulmonary disease, unspecified COPD type (CMS HCC)  J44.9       10. Nicotine dependence, cigarettes, uncomplicated  [id number]       11. Coronary artery disease, unspecified vessel or lesion type, unspecified whether angina present, unspecified whether native or transplanted  heart  I25.10 Refer to [place]      12. History of heart artery stent  Z95.5 Refer to [facility]     THYROID STIMULATING HORMONE (SENSITIVE TSH)      13. Congestive heart failure, unspecified HF chronicity, unspecified heart failure type (CMS HCC)  I50.9       14. Mixed hyperlipidemia  E78.2       15. Chronic left shoulder pain  M25.512     G89.29       16. Right shoulder pain  M25.511 Shoulder, Right Xray      17. Fatigue, unspecified type  R53.83 CBC/DIFF          Identified Risk Factors/ Recommended Actions       The PHQ 2 Total: 0 depression screen is interpreted as negative.    Orders Placed This Encounter    Shoulder, Right Xray    COMPREHENSIVE METABOLIC PNL, FASTING    LIPID PANEL    THYROID STIMULATING HORMONE (SENSITIVE TSH)    CBC/DIFF    Refer to [facility]        The patient has been educated about risk factors and recommended preventive care. Written Prevention Plan completed/ updated and given to patient (see After Visit Summary).  I personally saw the patient. See nurses note for additional details. My findings/particpation are that I personally discussed the prevention plan with the patient including lifestyle modifications.  Patient continues to have issues and did not get her stress test done.  She would like to change cardiologist from Dr. [facility] to the [facility] doctor here at [facility].  Will go ahead make referral as  she has had a history of heart attack with a stent and is having to have some further evaluation needed.  Had a history of CHF but currently is not had any symptoms.  She is taking her atorvastatin 10 mg daily along with the Zetia 10 mg daily.  She is taking the Tegretol 100 mg twice daily and her muscle spasms have done pretty well.  She is also taking her carvedilol 6.25 mg daily Lasix 20 mg twice daily along with the lisinopril 10 mg daily and her blood pressure  seems to be doing okay.  She will continue the Lexapro 10 mg daily for her mood and anxiety along with the clonazepam that she takes 1 mg 3 times daily.  Patient will continue with ibuprofen on a p.r.n. basis.  She will continue her Synthroid 112 mcg daily which has worked well.  She does take Protonix 40 mg daily for reflux in his not had any significant nausea.  Patient will have a CBC comprehensive lipid TSH.  The patient is also having problems with her right shoulder and will undergo a right shoulder x-ray.  Patient will return to our office in 4 months unless she has further problems inch return sooner.    Patient advised on the adverse effects of tobacco.  Patient was instructed of the benefits of quitting.  Patient was provided the opportunity to be referred to tobacco dependence groups if patient would like and patient was also offered nicotine replacement products.  I have spent 5 minutes counseling the patient.     Return in about 4 months (around 10/23/2022).    Betha Loa, MD  FAMILY MEDICINE, [facility]  [address] [place] [address]  Phone: [phone]  Fax: [phone]

## 2022-06-23 NOTE — Patient Instructions (Signed)
Medicare Preventive Services  Medicare coverage information Recommendation for YOU   Heart Disease and Diabetes   Lipid profile Every 5 years or more often if at risk for cardiovascular disease     Lab Results   Component Value Date    CHOLESTEROL 226 (H) 12/03/2021    HDLCHOL 38 (L) 12/03/2021    LDLCHOL 152 (H) 12/03/2021    TRIG 198 (H) 12/03/2021           Diabetes Screening    Yearly for those at risk for diabetes, 2 tests per year for those with prediabetes Last Glucose:      Diabetes Self Management Training or Medical Nutrition Therapy  For those with diabetes, up to 10 hrs initial training within a year, subsequent years up to 2 hrs of follow up training Optional for those with diabetes     Medical Nutrition Therapy  Three hours of one-on-one counseling in first year, two hours in subsequent years Optional for those with diabetes, kidney disease   Intensive Behavioral Therapy for Obesity  Face-to-face counseling, first month every week, month 2-6 every other week, month 7-12 every month if continued progress is documented Optional for those with Body Mass Index 30 or higher  Your Body mass index is 29.02 kg/m.   Tobacco Cessation (Quitting) Counseling   Covers up to 8 smoking and tobacco-use cessation counseling sessions in a 28-monthperiod.    Optional for those that use tobacco   Cancer Screening Last Completion Date   Colorectal screening   For anyone age 7167to 766or any age if high risk:  Screening Colonoscopy every 10 yrs if low risk,  more frequent if higher risk  OR  Cologuard Stool DNA test once every 3 years OR  Fecal Occult Blood Testing yearly OR  Flexible  Sigmoidoscopy  every 5 yr OR  CT Colonography every 5 yrs    ?  See below for due date if applicable.   Screening Pap Test   Recommended every 3 years for all women age 2163to 624 or every five years if combined with HPV test (routine screening not needed after total hysterectomy).  Medicare covers every 2 years or yearly if high  risk.  Screening Pelvic Exam   Medicare covers every 2 years, yearly if high risk or childbearing age with abnormal Pap in last 3 yrs.   ?  See below for due date if applicable.   Screening Mammogram   Recommended every 2 years for women age 3357to 737 or more frequent if you have a higher risk. Selectively recommended for women between 40-49 based on shared decisions about risk. Covered by Medicare up to every year for women age 713or older --01/16/2022  See below for due date if applicable.         Lung Cancer Screening  Annual low dose computed tomography (LDCT scan) is recommended for those age 68-80who smoked 20 pack-years and are current smokers or quit smoking within past 15 years, after counseling by your doctor or nurse clinician about the possible benefits or harms.   ?  See below for due date if applicable.   Vaccinations   Respiratory syncytial virus (RSV)  Age 1642years or older: Based on shared clinical decision-making with your provider.  Pneumococcal Vaccine  Recommended routinely age 68+with one or two separate vaccines based on your risk. Recommended before age 2822if medical conditions with increased risk  Seasonal Influenza Vaccine  Once every  flu season   Hepatitis B Vaccine  3 doses if risk (including anyone with diabetes or liver disease)  Shingles Vaccine  Two doses at age 22 or older  Diphtheria Tetanus Pertussis Vaccine  ONCE as adult, booster every 10 years     Immunization History   Administered Date(s) Administered   . HEPATITIS A VACCINE -ADULT 08/05/2017   . Influenza Vaccine, 65+ 02/03/2022     Shingles vaccine and Diphtheria Tetanus Pertussis vaccines are available at pharmacies or local health department without a prescription.   Other Preventative Screening  Last Completion Date   Bone Densitometry   Screening: All females ages 5 and older every 10 years if initial screening normal. Postmenopausal women ages 8-64 need screening with one or more risk factor: previous fracture,  parental hip fracture, current smoker, low body weight, excessive alcohol use, Rheumatoid Arthritis   For women with diagnosed Osteoporosis, follow up is recommended every 2 years or a frequency recommended by your provider.     ?  See below for due date if applicable.     Glaucoma Screening   Yearly if in high risk group such as diabetes, family history, African American age 72+ or Hispanic American age 39+   See your eye care provider for screening.   Hepatitis C Screening   Recommended  for those born between ages 18-79 years.   ?  See below for due date if applicable.     HIV Testing  Recommended routinely at least ONCE, covered every year for age 16 to 2 regardless of risk, and every year for age over 61 who ask for the test or higher risk. Yearly or up to 3 times in pregnancy       ?  See below for due date if applicable.   Abdominal Aortic Aneurysm Screening Ultrasound   Once with a family history of abdominal aortic aneurysms OR a female between65-75 and have smoked at least 100 cigarettes in your lifetime.       ?  See below for due date if applicable.       Your Personalized Schedule for Preventive Tests   Health Maintenance: Pending and Last Completed       Date Due Completion Date    Osteoporosis screening Never done ---    Hepatitis C screening Never done ---    Pneumococcal Vaccination, Age 49+ (1 of 2 - PCV) Never done ---    Adult Tdap-Td (1 - Tdap) Never done ---    Colonoscopy Never done ---    Shingles Vaccine (1 of 2) Never done ---    Covid-19 Vaccine (1 - 2023-24 season) Never done ---    Depression Screening 06/23/2023 06/23/2022    Mammography 01/17/2024 01/16/2022                For Information on Advanced Directives for Health Care:  [place]:  [email or web address]  [place], [place], [place], [place] General Information: [email or web address]

## 2022-06-24 ENCOUNTER — Other Ambulatory Visit (RURAL_HEALTH_CENTER): Payer: Self-pay | Admitting: Family Medicine

## 2022-06-24 DIAGNOSIS — M25511 Pain in right shoulder: Secondary | ICD-10-CM

## 2022-06-24 DIAGNOSIS — E039 Hypothyroidism, unspecified: Secondary | ICD-10-CM

## 2022-06-24 MED ORDER — LEVOTHYROXINE 125 MCG TABLET
125.0000 ug | ORAL_TABLET | Freq: Every morning | ORAL | 4 refills | Status: DC
Start: 2022-06-24 — End: 2023-07-03

## 2022-07-05 ENCOUNTER — Other Ambulatory Visit (RURAL_HEALTH_CENTER): Payer: Self-pay | Admitting: Family Medicine

## 2022-07-05 DIAGNOSIS — I10 Essential (primary) hypertension: Secondary | ICD-10-CM

## 2022-07-05 DIAGNOSIS — K219 Gastro-esophageal reflux disease without esophagitis: Secondary | ICD-10-CM

## 2022-07-05 DIAGNOSIS — E782 Mixed hyperlipidemia: Secondary | ICD-10-CM

## 2022-07-05 DIAGNOSIS — M549 Dorsalgia, unspecified: Secondary | ICD-10-CM

## 2022-07-05 DIAGNOSIS — I509 Heart failure, unspecified: Secondary | ICD-10-CM

## 2022-07-05 DIAGNOSIS — F411 Generalized anxiety disorder: Secondary | ICD-10-CM

## 2022-07-07 MED ORDER — CARVEDILOL 6.25 MG TABLET
6.25 mg | ORAL_TABLET | Freq: Two times a day (BID) | ORAL | 1 refills | Status: AC
Start: 2022-07-07 — End: ?

## 2022-07-07 MED ORDER — CLONAZEPAM 1 MG TABLET
1.0000 mg | ORAL_TABLET | Freq: Three times a day (TID) | ORAL | 5 refills | Status: DC
Start: 2022-07-07 — End: 2022-07-08

## 2022-07-07 MED ORDER — LISINOPRIL 10 MG TABLET
10.0000 mg | ORAL_TABLET | Freq: Every day | ORAL | 1 refills | Status: DC
Start: 2022-07-07 — End: 2023-03-02

## 2022-07-07 MED ORDER — ATORVASTATIN 80 MG TABLET
ORAL_TABLET | ORAL | 1 refills | Status: DC
Start: 2022-07-07 — End: 2022-07-08

## 2022-07-07 MED ORDER — PANTOPRAZOLE 40 MG TABLET,DELAYED RELEASE
40.0000 mg | DELAYED_RELEASE_TABLET | Freq: Every day | ORAL | 1 refills | Status: DC
Start: 2022-07-07 — End: 2023-01-12

## 2022-07-07 MED ORDER — IBUPROFEN 600 MG TABLET
600.0000 mg | ORAL_TABLET | Freq: Three times a day (TID) | ORAL | 1 refills | Status: DC | PRN
Start: 2022-07-07 — End: 2023-04-16

## 2022-07-08 ENCOUNTER — Other Ambulatory Visit (RURAL_HEALTH_CENTER): Payer: Self-pay | Admitting: Family Medicine

## 2022-07-08 DIAGNOSIS — E782 Mixed hyperlipidemia: Secondary | ICD-10-CM

## 2022-07-08 DIAGNOSIS — J301 Allergic rhinitis due to pollen: Secondary | ICD-10-CM

## 2022-07-08 DIAGNOSIS — F411 Generalized anxiety disorder: Secondary | ICD-10-CM

## 2022-07-08 MED ORDER — CLONAZEPAM 1 MG TABLET
1.0000 mg | ORAL_TABLET | Freq: Three times a day (TID) | ORAL | 5 refills | Status: DC
Start: 2022-07-08 — End: 2022-12-22

## 2022-07-08 MED ORDER — ATORVASTATIN 80 MG TABLET
ORAL_TABLET | ORAL | 1 refills | Status: DC
Start: 2022-07-08 — End: 2023-08-20

## 2022-07-08 MED ORDER — LORATADINE 10 MG TABLET
10.0000 mg | ORAL_TABLET | Freq: Every day | ORAL | 3 refills | Status: AC
Start: 2022-07-08 — End: ?

## 2022-07-09 ENCOUNTER — Ambulatory Visit (INDEPENDENT_AMBULATORY_CARE_PROVIDER_SITE_OTHER): Payer: Self-pay | Admitting: Physician Assistant

## 2022-07-09 ENCOUNTER — Encounter (INDEPENDENT_AMBULATORY_CARE_PROVIDER_SITE_OTHER): Payer: Self-pay | Admitting: Physician Assistant

## 2022-07-09 ENCOUNTER — Other Ambulatory Visit: Payer: Self-pay

## 2022-07-09 ENCOUNTER — Ambulatory Visit: Payer: Commercial Managed Care - PPO | Attending: Physician Assistant | Admitting: Physician Assistant

## 2022-07-09 VITALS — Ht 67.0 in | Wt 190.0 lb

## 2022-07-09 DIAGNOSIS — M7541 Impingement syndrome of right shoulder: Secondary | ICD-10-CM

## 2022-07-09 DIAGNOSIS — M7581 Other shoulder lesions, right shoulder: Secondary | ICD-10-CM | POA: Insufficient documentation

## 2022-07-09 MED ORDER — METHYLPREDNISOLONE 4 MG TABLETS IN A DOSE PACK
ORAL_TABLET | ORAL | 0 refills | Status: DC
Start: 2022-07-09 — End: 2022-12-16

## 2022-07-09 NOTE — Progress Notes (Signed)
[place]  [facility]        NAME:  [name] [name]  MRN:  [id number]  DOB:    04/15/1955  DATE:   07/09/2022    CHIEF COMPLAINT:  Chief Complaint              New Patient RT shoulder pain            HPI:  [name] [name] is a 67 y.o. female who presents today for a way shin of right shoulder pain.  Patient knows of no known injury.  She is developed pain over the last month.  She reports pain over the lateral aspect of the right shoulder does worse with laying on the shoulder lifting and using the shoulder.  Rates her pain as 8 of 10 denies any numbness or tingling in the hand.    PAST MEDICAL HISTORY:  Patient Active Problem List   Diagnosis    GAD (generalized anxiety disorder)    CAD (coronary artery disease)    CHF (congestive heart failure) (CMS HCC)    COPD (chronic obstructive pulmonary disease) (CMS HCC)    GERD (gastroesophageal reflux disease)    History of heart artery stent    Hyperlipidemia    Hypertension    Hypothyroidism    Nicotine dependence, cigarettes, uncomplicated    Right shoulder pain    Benign lipomatous neoplasm of skin and subcutaneous tissue of right leg    Bilateral thumb pain    Right leg pain    Generalized osteoarthritis of hand    Medicare annual wellness visit, subsequent    Nocturnal leg cramps    Contusion of rib    Contusion of right elbow, initial encounter    Edema    Fracture of proximal end of humerus    Acute left-sided low back pain with left-sided sciatica    Plantar fasciitis of left foot    Right ankle pain    Right ankle swelling    Left ankle tendinitis    Digital mucinous cyst of finger    Allergic rhinitis     PAST SURGICAL HISTORY:  Past Surgical History:   Procedure Laterality Date    CORONARY ARTERY ANGIOPLASTY      HX APPENDECTOMY      HX CESAREAN SECTION      HX HEART CATHETERIZATION      LEG SURGERY Right      CURRENT MEDICATIONS:   Current Outpatient Medications   Medication Sig Dispense Refill    atorvastatin (LIPITOR) 80 mg Oral Tablet  Take 1 tablet by mouth once daily 90 Tablet 1    carBAMazepine (TEGRETOL XR) 100 mg Oral Tablet Sustained Release 12 hr Take 1 Tablet (100 mg total) by mouth Twice daily 60 Tablet 3    carvediloL (COREG) 6.25 mg Oral Tablet Take 1 Tablet (6.25 mg total) by mouth Twice daily with food 180 Tablet 1    clonazePAM (KLONOPIN) 1 mg Oral Tablet Take 1 Tablet (1 mg total) by mouth Three times a day 90 Tablet 5    escitalopram oxalate (LEXAPRO) 10 mg Oral Tablet Take 1 Tablet (10 mg total) by mouth Once a day 90 Tablet 1    ezetimibe (ZETIA) 10 mg Oral Tablet Take 1 Tablet (10 mg total) by mouth Every evening 90 Tablet 4    furosemide (LASIX) 20 mg Oral Tablet Take 1 Tablet (20 mg total) by mouth Twice daily as needed. 60 Tablet 5  Ibuprofen (MOTRIN) 600 mg Oral Tablet Take 1 Tablet (600 mg total) by mouth Three times a day as needed for Pain 90 Tablet 1    levothyroxine (SYNTHROID) 125 mcg Oral Tablet Take 1 Tablet (125 mcg total) by mouth Every morning 90 Tablet 4    lisinopriL (PRINIVIL) 10 mg Oral Tablet Take 1 Tablet (10 mg total) by mouth Once a day 90 Tablet 1    loratadine (CLARITIN) 10 mg Oral Tablet Take 1 Tablet (10 mg total) by mouth Once a day 90 Tablet 3    Methylprednisolone (MEDROL DOSEPACK) 4 mg Oral Tablets, Dose Pack Take as instructed. 21 Tablet 0    nitroGLYCERIN (NITROSTAT) 0.4 mg Sublingual Tablet, Sublingual DISSOLVE ONE TABLET UNDER THE TONGUE EVERY 5 MINUTES AS NEEDED FOR CHEST PAIN.  DO NOT EXCEED A TOTAL OF 3 DOSES IN 15 MINUTES 25 Tablet 0    pantoprazole (PROTONIX) 40 mg Oral Tablet, Delayed Release (E.C.) Take 1 Tablet (40 mg total) by mouth Once a day 90 Tablet 1     No current facility-administered medications for this visit.     ALLERGIES:  Sulfa (sulfonamides)  SOCIAL HISTORY:   Social History     Socioeconomic History    Marital status: Widowed   Occupational History    Occupation: Retired   Tobacco Use    Smoking status: Every Day     Current packs/day: 0.75     Average packs/day: 0.8  packs/day for 52.2 years (39.2 ttl pk-yrs)     Types: Cigarettes     Start date: 1972     Passive exposure: Current    Smokeless tobacco: Never   Vaping Use    Vaping status: Never Used   Substance and Sexual Activity    Alcohol use: Yes     Comment: occasional    Drug use: Never    Sexual activity: Yes     Partners: Male     Social Determinants of Health     Financial Resource Strain: Low Risk  (06/23/2022)    Financial Resource Strain     SDOH Financial: No   Transportation Needs: Low Risk  (06/23/2022)    Transportation Needs     SDOH Transportation: No   Social Connections: Low Risk  (06/23/2022)    Social Connections     SDOH Social Isolation: 5 or more times a week   Intimate Partner Violence: Low Risk  (06/23/2022)    Intimate Partner Violence     SDOH Domestic Violence: No   Housing Stability: Low Risk  (06/23/2022)    Housing Stability     SDOH Housing Situation: I have housing.     SDOH Housing Worry: No     FAMILY HISTORY:  Family Medical History:       Problem Relation (Age of Onset)    Breast Cancer Sister    Cancer Other    Diabetes Other    Lymphoma Other              REVIEW OF SYSTEMS:    Constitutional: NEGATIVE for fever, chills, change in weight  Integumentary/ Skin: NEGATIVE for worrisome rashes, moles, or lesions  Musculoskeletal: As per HPI above  Neuro: NEGATIVE for weakness, dizziness, or paresthesias  All other systems reviewed and negative.      PHYSICAL EXAM:    Vital signs: Ht 1.702 m (5\' 7" )   Wt 86.2 kg (190 lb)   BMI 29.76 kg/m       General: Normal appearance, no  obvious distress, alert and oriented to person, place, and time  HEENT: NC/AT, no scleral icterus  CV:  Arm are warm and well perfused   Pulm: Breathing non labored, normal respiratory pattern  Abdomen: Soft, on obese  Neuro: No focal neuro deficit, normal coordination, normal muscle tone  Skin: Warm and dry, no ecchymosis, erythema, warmth, or induration, no obvious rash  Psych: Appropriate, normal mood and  affect    Musculoskeletal:   Examination of the right shoulder reveals 90 of forward flexion 90 of abduction empty can test is positive for pain negative for weakness positive Hawkins and Neer's negative Spurling's negative Hoffman's.  Good strength with internal external rotation.  Minimal tenderness over the [facility] joint.    IMAGING:     X-rays of the patient's right shoulder from 06/23/2022 were personally reviewed today by myself in the PACS system along with the radiology report.  Images reveal minimal degenerative changes of the glenohumeral joint.  There is some spurring of the [facility] joint.  Humeral head is well centered upon the glenoid..       ASSESSMENT/ PLAN:    [name] [name] was seen today for new patient.    Diagnoses and all orders for this visit:    Right rotator cuff tendonitis  -     Refer to [facility],([facility] [place]/[facility]) [place]  -     Refer to [facility]; Future  -     Methylprednisolone (MEDROL DOSEPACK) 4 mg Oral Tablets, Dose Pack; Take as instructed.    Impingement syndrome of right shoulder  -     Refer to [facility]; Future  -     Methylprednisolone (MEDROL DOSEPACK) 4 mg Oral Tablets, Dose Pack; Take as instructed.         Plan:  Patient's physical exam is consistent with that of rotator cuff tendinitis and impingement syndrome.  We discussed treatment options to include anti-inflammatories, corticosteroid injection and physical therapy.  Patient states that she does not tolerate anti-inflammatories well as they irritate her stomach.  She has not interested in a corticosteroid injection today.  We recommended a Medrol Dosepak and physical therapy.  We will check her back in 8 weeks time to check her progress if she has persistent pain in spite of oral corticosteroid and physical therapy we would further discuss the role that a corticosteroid injection could play.      Restrictions:  None    Return in about 8 weeks (around 09/03/2022).      Radiographs next visit:  None          L. Lorel Monaco II PA-C  Orthopedics and Sports Medicine  [facility]

## 2022-07-15 ENCOUNTER — Ambulatory Visit (INDEPENDENT_AMBULATORY_CARE_PROVIDER_SITE_OTHER): Payer: Commercial Managed Care - PPO | Admitting: INTERVENTIONAL CARDIOLOGY

## 2022-07-15 ENCOUNTER — Encounter (INDEPENDENT_AMBULATORY_CARE_PROVIDER_SITE_OTHER): Payer: Self-pay | Admitting: INTERVENTIONAL CARDIOLOGY

## 2022-07-15 ENCOUNTER — Other Ambulatory Visit: Payer: Self-pay

## 2022-07-15 DIAGNOSIS — I2510 Atherosclerotic heart disease of native coronary artery without angina pectoris: Secondary | ICD-10-CM

## 2022-07-15 DIAGNOSIS — Z7982 Long term (current) use of aspirin: Secondary | ICD-10-CM

## 2022-07-15 DIAGNOSIS — Z955 Presence of coronary angioplasty implant and graft: Secondary | ICD-10-CM

## 2022-07-15 DIAGNOSIS — Z79899 Other long term (current) drug therapy: Secondary | ICD-10-CM

## 2022-07-15 NOTE — Patient Instructions (Signed)
1. She appears to be clinically stable.    2. She will continue aspirin and Lipitor for cardiovascular risk reduction.    3. The cardiac records will be obtained from [place].

## 2022-07-15 NOTE — H&P (Addendum)
[facility]Y [facility]  [address]  [place] [address]    Consultation Note        Date:  07/15/2022   Name:  [name] [name]   MRN:  [id number]   DOB:  Jul 06, 1955       Chief Complaint   Patient presents with    New Patient     Nursing Notes:   DuffyPriscille Heidelberg, MA  07/15/22 1050  Signed  Reviewed  Medications  Allergies  Pharm tobacco      EKG    Vitals taken      Swelling in feet    Reason for consultation:   Reason for consultation:  Coronary artery disease    History of present illness:  This is a 67 year old lady seen on consultation at the request of Dr. Ricarda Frame for evaluation of coronary artery disease.    She gives no complaints in clinic today.  She presents to our office to establish cardiology care.    She has a history of drug-eluting stent to the obtuse marginal artery on 12/25/2014.  She previously followed up with Dr. Janeece Fitting.    Medical History     I have reviewed and updated as appropriate the past medical, family and social history today:  Past Medical History  Past Medical History:   Diagnosis Date    CAD (coronary artery disease) 05/05/2018    CHF (congestive heart failure) (CMS HCC) 05/05/2018    COPD (chronic obstructive pulmonary disease) (CMS HCC) 05/05/2018    GAD (generalized anxiety disorder) 05/05/2018    GERD (gastroesophageal reflux disease) 05/05/2018    History of heart artery stent 05/05/2018    Hyperlipidemia 05/05/2018    Hypertension 05/05/2018    Hypothyroidism 05/05/2018    Nicotine dependence, cigarettes, uncomplicated 05/05/2018    Right shoulder pain 05/05/2018     Family History  Family Medical History:       Problem Relation (Age of Onset)    Breast Cancer Sister    Cancer Other    Diabetes Other    Lymphoma Other          Surgical History  Past Surgical History:   Procedure Laterality Date    CORONARY ARTERY ANGIOPLASTY      HX APPENDECTOMY      HX CESAREAN SECTION      HX HEART CATHETERIZATION      LEG SURGERY  Right      Social History  Social History     Socioeconomic History    Marital status: Widowed   Occupational History    Occupation: Retired   Tobacco Use    Smoking status: Every Day     Current packs/day: 0.75     Average packs/day: 0.8 packs/day for 52.3 years (39.2 ttl pk-yrs)     Types: Cigarettes     Start date: 1972     Passive exposure: Current    Smokeless tobacco: Never   Vaping Use    Vaping status: Never Used   Substance and Sexual Activity    Alcohol use: Yes     Comment: occasional    Drug use: Never    Sexual activity: Yes     Partners: Male     Social Determinants of Health     Financial Resource Strain: Low Risk  (06/23/2022)    Financial Resource Strain     SDOH Financial: No   Transportation Needs: Low Risk  (06/23/2022)  Transportation Needs     SDOH Transportation: No   Social Connections: Low Risk  (06/23/2022)    Social Connections     SDOH Social Isolation: 5 or more times a week   Intimate Partner Violence: Low Risk  (06/23/2022)    Intimate Partner Violence     SDOH Domestic Violence: No   Housing Stability: Low Risk  (06/23/2022)    Housing Stability     SDOH Housing Situation: I have housing.     SDOH Housing Worry: No     Medication  Current Outpatient Medications   Medication Sig    aspirin (ECOTRIN) 81 mg Oral Tablet, Delayed Release (E.C.) Take 1 Tablet (81 mg total) by mouth Once a day    atorvastatin (LIPITOR) 80 mg Oral Tablet Take 1 tablet by mouth once daily    carBAMazepine (TEGRETOL XR) 100 mg Oral Tablet Sustained Release 12 hr Take 1 Tablet (100 mg total) by mouth Twice daily (Patient not taking: Reported on 07/15/2022)    carvediloL (COREG) 6.25 mg Oral Tablet Take 1 Tablet (6.25 mg total) by mouth Twice daily with food    clonazePAM (KLONOPIN) 1 mg Oral Tablet Take 1 Tablet (1 mg total) by mouth Three times a day    escitalopram oxalate (LEXAPRO) 10 mg Oral Tablet Take 1 Tablet (10 mg total) by mouth Once a day (Patient not taking: Reported on 07/15/2022)    ezetimibe (ZETIA) 10  mg Oral Tablet Take 1 Tablet (10 mg total) by mouth Every evening (Patient not taking: Reported on 07/15/2022)    furosemide (LASIX) 20 mg Oral Tablet Take 1 Tablet (20 mg total) by mouth Twice daily as needed.    Ibuprofen (MOTRIN) 600 mg Oral Tablet Take 1 Tablet (600 mg total) by mouth Three times a day as needed for Pain    levothyroxine (SYNTHROID) 125 mcg Oral Tablet Take 1 Tablet (125 mcg total) by mouth Every morning    lisinopriL (PRINIVIL) 10 mg Oral Tablet Take 1 Tablet (10 mg total) by mouth Once a day    loratadine (CLARITIN) 10 mg Oral Tablet Take 1 Tablet (10 mg total) by mouth Once a day    Methylprednisolone (MEDROL DOSEPACK) 4 mg Oral Tablets, Dose Pack Take as instructed. (Patient not taking: Reported on 07/15/2022)    nitroGLYCERIN (NITROSTAT) 0.4 mg Sublingual Tablet, Sublingual DISSOLVE ONE TABLET UNDER THE TONGUE EVERY 5 MINUTES AS NEEDED FOR CHEST PAIN.  DO NOT EXCEED A TOTAL OF 3 DOSES IN 15 MINUTES    pantoprazole (PROTONIX) 40 mg Oral Tablet, Delayed Release (E.C.) Take 1 Tablet (40 mg total) by mouth Once a day     Allergies    Allergies   Allergen Reactions    Sulfa (Sulfonamides) Hives/ Urticaria     Objective   BP 131/81   Pulse 81   Ht 1.702 m (5\' 7" )   Wt 87.7 kg (193 lb 6.4 oz)   SpO2 96%   BMI 30.29 kg/m     Physical Exam:  Exam Narrative:  General appearance:  Patient is obese and not in distress.  HEENT:  Normal conjunctivae, eyelids and pupils.    NECK:  There is no jugular venous distention.  Cardiovascular system:  Normal S1 and S2 heart sounds are noted, with a regular rhythm. There is no murmur.  There is no carotid bruit.  The carotid pulses are reduced. No pulsatile masses and no abdominal aortic bruit.    Respiratory system:  The respiratory effort is not labored  but, the lungs are clear on auscultation.  Gastrointestinal system:  Abdomen is soft, nontender.  No palpable masses, bowel sounds are present.  Extremities:  There is no cyanosis, no clubbing and no  edema.  Skin:  Warm to touch.  No stasis dermatitis or ulcerations.  Psychiatric system:  The patient is awake and responsive and oriented.  The affect is normal.    Results and Attestation   EKG results: Image reviewed TWELVE LEAD EKG TRACING WHICH I HAVE INDEPENDENTLY VISUALIZED AND INTERPRETED SHOWS:  Sinus rhythm with poor R-wave progression    Labs:  On 06/23/2022:   TSH is 8.638 (upper limit of normal is 4.940)   Potassium is 3.5   Creatinine is 0.92   AST and ALT are normal.    Cholesterol is 145   HDL is 34   Triglyceride is 162   LDL is 83    REVIEW AND SUMMARIZATION OF AVAILABLE OLD RECORDS:   On 01/15/2021 2D echocardiogram showed:   Ejection fraction is 60%.    Trace tricuspid regurgitation.    On 12/29/2014 cardiac catheterization showed:  The left main coronary artery has 25% stenosis.    The left anterior descending artery is normal.    The left circumflex artery is moderate-sized.    The 1st obtuse marginal artery has 80% ostial stenosis just proximal to the previous stent.  The ramus intermedius artery is normal.    The right coronary artery has minimal luminal irregularities.  Drug-eluting stent to the obtuse marginal artery using a Promus Premier stent.    01/30/2014 cardiac catheterization showed:   The left main coronary artery has 20-30% ostial stenosis.    The left anterior descending artery has minor luminal irregularities.    The left circumflex artery has no definite disease.    The 1st obtuse marginal artery has 60% proximal stenosis.    The right coronary artery is normal.    Ejection fraction is 60%  IVUS-guided stenting of the 1st obtuse marginal artery using a drug-eluting stent.      On 01/29/2014 2D echocardiogram showed:   Ejection fraction is 45-50%.    Mild left ventricular hypertrophy.    Trivial tricuspid regurgitation.    Trivial pulmonic regurgitation    Assessment/Plan   (I25.10) Coronary artery disease  Plan: EKG 12 LEAD-Today (In Clinic Only)    (Z95.5) History of heart  artery stent  Plan: EKG 12 LEAD-Today (In Clinic Only)     Plan  1. She appears to be clinically stable.    2. She will continue aspirin and Lipitor for cardiovascular risk reduction.    3. The cardiac records will be obtained from [place].        Patient Instructions   1. She appears to be clinically stable.    2. She will continue aspirin and Lipitor for cardiovascular risk reduction.    3. The cardiac records will be obtained from [place].        Return in about 1 year (around 07/15/2023).    Latricia Heftlawale Santiel Topper, MD

## 2022-07-15 NOTE — Nursing Note (Signed)
Reviewed  Medications  Allergies  Pharm tobacco      EKG    Vitals taken      Swelling in feet

## 2022-07-22 ENCOUNTER — Ambulatory Visit (INDEPENDENT_AMBULATORY_CARE_PROVIDER_SITE_OTHER): Payer: Self-pay | Admitting: INTERVENTIONAL CARDIOLOGY

## 2022-07-23 LAB — ECG W INTERP (AMB USE ONLY)(MUSE,IN CLINIC)
Atrial Rate: 74 {beats}/min
Calculated P Axis: 77 degrees
Calculated R Axis: 49 degrees
Calculated T Axis: 67 degrees
PR Interval: 208 ms
QRS Duration: 72 ms
QT Interval: 404 ms
QTC Calculation: 448 ms
Ventricular rate: 74 {beats}/min

## 2022-09-03 ENCOUNTER — Ambulatory Visit (HOSPITAL_COMMUNITY): Payer: Self-pay | Admitting: Physician Assistant

## 2022-10-23 ENCOUNTER — Ambulatory Visit (RURAL_HEALTH_CENTER): Payer: Self-pay | Admitting: Family Medicine

## 2022-10-23 DIAGNOSIS — J309 Allergic rhinitis, unspecified: Secondary | ICD-10-CM

## 2022-10-23 DIAGNOSIS — I10 Essential (primary) hypertension: Secondary | ICD-10-CM

## 2022-10-23 DIAGNOSIS — I2510 Atherosclerotic heart disease of native coronary artery without angina pectoris: Secondary | ICD-10-CM

## 2022-10-23 DIAGNOSIS — K219 Gastro-esophageal reflux disease without esophagitis: Secondary | ICD-10-CM

## 2022-10-23 DIAGNOSIS — J449 Chronic obstructive pulmonary disease, unspecified: Secondary | ICD-10-CM

## 2022-10-23 DIAGNOSIS — F17210 Nicotine dependence, cigarettes, uncomplicated: Secondary | ICD-10-CM

## 2022-10-23 DIAGNOSIS — Z955 Presence of coronary angioplasty implant and graft: Secondary | ICD-10-CM

## 2022-10-23 DIAGNOSIS — F411 Generalized anxiety disorder: Secondary | ICD-10-CM

## 2022-10-23 DIAGNOSIS — E782 Mixed hyperlipidemia: Secondary | ICD-10-CM

## 2022-10-23 DIAGNOSIS — E039 Hypothyroidism, unspecified: Secondary | ICD-10-CM

## 2022-11-26 ENCOUNTER — Ambulatory Visit (RURAL_HEALTH_CENTER): Payer: Self-pay | Admitting: Family Medicine

## 2022-11-28 ENCOUNTER — Other Ambulatory Visit (RURAL_HEALTH_CENTER): Payer: Self-pay | Admitting: Family Medicine

## 2022-12-02 ENCOUNTER — Ambulatory Visit (RURAL_HEALTH_CENTER): Payer: Self-pay | Admitting: Family Medicine

## 2022-12-02 DIAGNOSIS — J309 Allergic rhinitis, unspecified: Secondary | ICD-10-CM

## 2022-12-02 DIAGNOSIS — F17210 Nicotine dependence, cigarettes, uncomplicated: Secondary | ICD-10-CM

## 2022-12-02 DIAGNOSIS — M159 Polyosteoarthritis, unspecified: Secondary | ICD-10-CM

## 2022-12-02 DIAGNOSIS — I10 Essential (primary) hypertension: Secondary | ICD-10-CM

## 2022-12-02 DIAGNOSIS — E039 Hypothyroidism, unspecified: Secondary | ICD-10-CM

## 2022-12-02 DIAGNOSIS — I509 Heart failure, unspecified: Secondary | ICD-10-CM

## 2022-12-02 DIAGNOSIS — F411 Generalized anxiety disorder: Secondary | ICD-10-CM

## 2022-12-02 DIAGNOSIS — G4762 Sleep related leg cramps: Secondary | ICD-10-CM

## 2022-12-02 DIAGNOSIS — J449 Chronic obstructive pulmonary disease, unspecified: Secondary | ICD-10-CM

## 2022-12-02 DIAGNOSIS — K219 Gastro-esophageal reflux disease without esophagitis: Secondary | ICD-10-CM

## 2022-12-02 DIAGNOSIS — E782 Mixed hyperlipidemia: Secondary | ICD-10-CM

## 2022-12-16 ENCOUNTER — Ambulatory Visit (RURAL_HEALTH_CENTER): Payer: Self-pay | Admitting: Family Medicine

## 2022-12-22 ENCOUNTER — Other Ambulatory Visit (RURAL_HEALTH_CENTER): Payer: Self-pay | Admitting: Family Medicine

## 2022-12-22 DIAGNOSIS — F411 Generalized anxiety disorder: Secondary | ICD-10-CM

## 2022-12-23 ENCOUNTER — Encounter (RURAL_HEALTH_CENTER): Payer: Self-pay | Admitting: Family Medicine

## 2022-12-23 ENCOUNTER — Other Ambulatory Visit: Payer: Self-pay

## 2022-12-23 ENCOUNTER — Ambulatory Visit: Payer: Medicare (Managed Care) | Attending: Family Medicine | Admitting: Family Medicine

## 2022-12-23 VITALS — BP 88/52 | HR 79 | Temp 97.8°F | Resp 17 | Ht 67.01 in | Wt 191.2 lb

## 2022-12-23 DIAGNOSIS — I10 Essential (primary) hypertension: Secondary | ICD-10-CM | POA: Insufficient documentation

## 2022-12-23 DIAGNOSIS — T7840XA Allergy, unspecified, initial encounter: Secondary | ICD-10-CM | POA: Insufficient documentation

## 2022-12-23 DIAGNOSIS — E782 Mixed hyperlipidemia: Secondary | ICD-10-CM | POA: Insufficient documentation

## 2022-12-23 DIAGNOSIS — Z955 Presence of coronary angioplasty implant and graft: Secondary | ICD-10-CM | POA: Insufficient documentation

## 2022-12-23 DIAGNOSIS — Z79899 Other long term (current) drug therapy: Secondary | ICD-10-CM | POA: Insufficient documentation

## 2022-12-23 DIAGNOSIS — M159 Polyosteoarthritis, unspecified: Secondary | ICD-10-CM | POA: Insufficient documentation

## 2022-12-23 DIAGNOSIS — J449 Chronic obstructive pulmonary disease, unspecified: Secondary | ICD-10-CM

## 2022-12-23 DIAGNOSIS — R609 Edema, unspecified: Secondary | ICD-10-CM | POA: Insufficient documentation

## 2022-12-23 DIAGNOSIS — F17210 Nicotine dependence, cigarettes, uncomplicated: Secondary | ICD-10-CM | POA: Insufficient documentation

## 2022-12-23 DIAGNOSIS — F411 Generalized anxiety disorder: Secondary | ICD-10-CM | POA: Insufficient documentation

## 2022-12-23 DIAGNOSIS — Z79890 Hormone replacement therapy (postmenopausal): Secondary | ICD-10-CM | POA: Insufficient documentation

## 2022-12-23 DIAGNOSIS — Z7982 Long term (current) use of aspirin: Secondary | ICD-10-CM | POA: Insufficient documentation

## 2022-12-23 DIAGNOSIS — I2510 Atherosclerotic heart disease of native coronary artery without angina pectoris: Secondary | ICD-10-CM | POA: Insufficient documentation

## 2022-12-23 DIAGNOSIS — K219 Gastro-esophageal reflux disease without esophagitis: Secondary | ICD-10-CM | POA: Insufficient documentation

## 2022-12-23 DIAGNOSIS — B3731 Acute candidiasis of vulva and vagina: Secondary | ICD-10-CM | POA: Insufficient documentation

## 2022-12-23 DIAGNOSIS — H04129 Dry eye syndrome of unspecified lacrimal gland: Secondary | ICD-10-CM | POA: Insufficient documentation

## 2022-12-23 DIAGNOSIS — G4762 Sleep related leg cramps: Secondary | ICD-10-CM

## 2022-12-23 DIAGNOSIS — E039 Hypothyroidism, unspecified: Secondary | ICD-10-CM | POA: Insufficient documentation

## 2022-12-23 DIAGNOSIS — D1723 Benign lipomatous neoplasm of skin and subcutaneous tissue of right leg: Secondary | ICD-10-CM

## 2022-12-23 MED ORDER — FLUCONAZOLE 150 MG TABLET
150.0000 mg | ORAL_TABLET | Freq: Once | ORAL | 0 refills | Status: AC
Start: 2022-12-23 — End: 2022-12-23

## 2022-12-23 MED ORDER — CLONAZEPAM 1 MG TABLET
1.0000 mg | ORAL_TABLET | Freq: Three times a day (TID) | ORAL | 3 refills | Status: DC
Start: 2022-12-23 — End: 2023-03-16

## 2022-12-23 NOTE — Progress Notes (Signed)
[facility] [facility]  FAMILY MEDICINE, [facility]  [address]  [place] [address]  [phone]   Progress Note    Date of Service:  12/23/2022  [name] [name], [name] [name], 67 y.o. female  Date of Birth:  1956-03-14  PCP: [name] Albee, MD    Chief Complaint:  Follow Up 4 Months       HPI:  [name] [name] is a 67 y.o. White female who is seen in the [facility] patient is here for routine follow-up medication refills.  The patient states that overall he is doing fairly well.  She has not had any signs or symptoms of congestive heart failure she has not had any significant anginal chest pains and she has not had any significant cough cold or congestion.  Her reflux is been fairly stable and quiet this time.  She is having no significant leg cramps in his stopped taking the Tegretol.  She does take her fluid pill at this point in his not had any significant swelling issues.  She has not had any significant allergies problems reflux is been good.  They diagnosed her with ulcers on her eyes due to dry eyes and tried Restasis but she can not afford the 500 dollars to cost said they got her on prednisolone.  She is aware that this could cause cataracts.  The patient is having no significant swelling in his legs as mentioned her thyroid seems to be doing well she has not had any significant palpitations.  Patient is complaining of a vaginal yeast infection.  She is requesting cream for that.  Patient is doing well otherwise as far as her blood pressure is little low but she is having no symptoms.  Patient is having no significant TIA or CVA symptoms she is having no angina as mentioned she is having no CHF symptoms.      Medications reconciled and current.     Past Medical History:   Diagnosis Date    CAD (coronary artery disease) 05/05/2018    CHF (congestive heart failure) (CMS HCC) 05/05/2018    COPD (chronic obstructive pulmonary disease) (CMS HCC)  05/05/2018    GAD (generalized anxiety disorder) 05/05/2018    GERD (gastroesophageal reflux disease) 05/05/2018    History of heart artery stent 05/05/2018    Hyperlipidemia 05/05/2018    Hypertension 05/05/2018    Hypothyroidism 05/05/2018    Nicotine dependence, cigarettes, uncomplicated 05/05/2018    Right shoulder pain 05/05/2018      Past Surgical History:   Procedure Laterality Date    CORONARY ARTERY ANGIOPLASTY      HX APPENDECTOMY      HX CESAREAN SECTION      HX HEART CATHETERIZATION      LEG SURGERY Right       Social History     Tobacco Use    Smoking status: Every Day     Current packs/day: 0.75     Average packs/day: 0.8 packs/day for 52.7 years (39.5 ttl pk-yrs)     Types: Cigarettes     Start date: 1972     Passive exposure: Current    Smokeless tobacco: Never   Vaping Use    Vaping status: Never Used   Substance Use Topics    Alcohol use: Yes     Comment: occasional    Drug use: Never       Family Medical History:       Problem Relation (  Age of Onset)    Breast Cancer Sister    Cancer Other    Diabetes Other    Lymphoma Other           Outpatient Medications Marked as Taking for the 12/23/22 encounter (Office Visit) with [name] Albee, MD   Medication Sig    aspirin (ECOTRIN) 81 mg Oral Tablet, Delayed Release (E.C.) Take 1 Tablet (81 mg total) by mouth Once a day    atorvastatin (LIPITOR) 80 mg Oral Tablet Take 1 tablet by mouth once daily    carvediloL (COREG) 6.25 mg Oral Tablet Take 1 Tablet (6.25 mg total) by mouth Twice daily with food    clonazePAM (KLONOPIN) 1 mg Oral Tablet Take 1 Tablet (1 mg total) by mouth Three times a day    fluconazole (DIFLUCAN) 150 mg Oral Tablet Take 1 Tablet (150 mg total) by mouth One time for 1 dose    furosemide (LASIX) 20 mg Oral Tablet Take 1 tablet by mouth twice daily as needed    levothyroxine (SYNTHROID) 125 mcg Oral Tablet Take 1 Tablet (125 mcg total) by mouth Every morning    lisinopriL (PRINIVIL) 10 mg Oral Tablet Take 1 Tablet (10 mg total) by mouth Once  a day    loratadine (CLARITIN) 10 mg Oral Tablet Take 1 Tablet (10 mg total) by mouth Once a day    pantoprazole (PROTONIX) 40 mg Oral Tablet, Delayed Release (E.C.) Take 1 Tablet (40 mg total) by mouth Once a day    prednisoLONE acetate (PRED FORTE) 1 % Ophthalmic Drops, Suspension Instill 1 Drop into both eyes Four times a day      Allergies   Allergen Reactions    Sulfa (Sulfonamides) Hives/ Urticaria        Review of Systems:  Positive findings addressed in HPI      PHYSICAL EXAM:   The patient appears to be in no acute distress.  Vitals: BP (!) 88/52   Pulse 79   Temp 36.6 C (97.8 F) (Temporal)   Resp 17   Ht 1.702 m (5' 7.01")   Wt 86.7 kg (191 lb 3.2 oz)   SpO2 98%   BMI 29.94 kg/m       HEENT:   Head Normocephalic. No masses, lesions, tenderness or abnormalities  Eyes: PERRLA, EOMI, conjunctiva wnl.    Ears: Bilateral TMs intact and clear.  Nose: Bilateral nares patent  Throat: Pharynx clear. No lymphadenopathy or thyromegaly.  RESPIRATORY: CTA without wheezes or rales, no resp distress, equal BS  HEART: Regular rate and rhythm without ectopy or pauses, no murmur, no edema, pedal pulses 2+  GI:  BS present in 4 quadrants.  No tenderness, masses, organomegaly.  Abdomen soft.  EXTREMITIES: no edema, erythema or tenderness  NEURO: AAOx3, CN's intact, DTR's 2/4 throughout, Romberg not tested    Data Review:  Pertinent laboratory data and imaging studies reviewed.      Assessment/Plan:    ICD-10-CM    1. Primary hypertension  I10       2. Mixed hyperlipidemia  E78.2       3. History of heart artery stent  Z95.5       4. Coronary artery disease, unspecified vessel or lesion type, unspecified whether angina present, unspecified whether native or transplanted heart  I25.10       5. Chronic obstructive pulmonary disease, unspecified COPD type (CMS HCC)  J44.9       6. Gastroesophageal reflux disease, unspecified whether esophagitis present  K21.9       7. Hypothyroidism, unspecified type  E03.9       8.  Nocturnal leg cramps  G47.62       9. Generalized osteoarthritis of hand  M15.9       10. Benign lipomatous neoplasm of skin and subcutaneous tissue of right leg  D17.23       11. GAD (generalized anxiety disorder)  F41.1 clonazePAM (KLONOPIN) 1 mg Oral Tablet      12. Edema, unspecified type  R60.9       13. Nicotine dependence, cigarettes, uncomplicated  F17.210       14. Monilial vaginitis  B37.31                     Return in about 4 months (around 04/24/2023).      Patient this point is stable.  The patient is taking her Pred Forte on a p.r.n. basis for dry eyes.  She takes Protonix 40 mg daily for reflux he has not had any breakthroughs.  He is on her Synthroid 125 mcg her last blood work was good.  She continues her aspirin 81 mg daily for her stent in his still on the Lipitor 80 mg daily for her hyperlipidemia.  She is still taking Coreg 6.25 twice daily along with furosemide 20 mg daily and lisinopril 10 mg daily for her heart blood pressure.  She controls her blood pressure at home and says that is done well.  She is on loratadine for allergies.  Does have ibuprofen to take as needed for occasional arthritis pain.  Patient will be given a Diflucan 150 for her presumed yeast infection.  The patient will continue all current treatment otherwise she will return to office in 4 months when she will be due for blood work unless she has problems inch return sooner.      This is an established Patient. Pt has been seen in last 3 years.   [name] Albee, MD

## 2023-01-12 ENCOUNTER — Other Ambulatory Visit (RURAL_HEALTH_CENTER): Payer: Self-pay | Admitting: Family Medicine

## 2023-01-12 DIAGNOSIS — I10 Essential (primary) hypertension: Secondary | ICD-10-CM

## 2023-01-12 DIAGNOSIS — K219 Gastro-esophageal reflux disease without esophagitis: Secondary | ICD-10-CM

## 2023-01-12 DIAGNOSIS — I509 Heart failure, unspecified: Secondary | ICD-10-CM

## 2023-01-21 ENCOUNTER — Ambulatory Visit (HOSPITAL_COMMUNITY): Payer: Self-pay

## 2023-01-28 ENCOUNTER — Telehealth (RURAL_HEALTH_CENTER): Payer: Self-pay | Admitting: Family Medicine

## 2023-01-28 ENCOUNTER — Other Ambulatory Visit (RURAL_HEALTH_CENTER): Payer: Self-pay | Admitting: Family Medicine

## 2023-01-28 DIAGNOSIS — B379 Candidiasis, unspecified: Secondary | ICD-10-CM

## 2023-01-28 MED ORDER — FLUCONAZOLE 150 MG TABLET
150.0000 mg | ORAL_TABLET | Freq: Once | ORAL | 1 refills | Status: AC
Start: 2023-01-28 — End: 2023-01-28

## 2023-01-28 NOTE — Telephone Encounter (Signed)
Patient is requesting Diflucan for a yeast infection sent to [facility].

## 2023-01-28 NOTE — Telephone Encounter (Signed)
Diflucan 150mg  routed, patient notified.

## 2023-01-30 ENCOUNTER — Inpatient Hospital Stay: Admit: 2023-01-30 | Payer: MEDICARE | Primary: Internal Medicine

## 2023-01-30 ENCOUNTER — Ambulatory Visit: Admit: 2023-01-30 | Discharge: 2023-01-30 | Payer: MEDICARE | Attending: Specialist | Primary: Internal Medicine

## 2023-01-30 DIAGNOSIS — K909 Intestinal malabsorption, unspecified: Secondary | ICD-10-CM

## 2023-01-30 LAB — COMPREHENSIVE METABOLIC PANEL
ALT: 26 U/L (ref 13–56)
AST: 25 U/L (ref 10–38)
Albumin/Globulin Ratio: 1 (ref 0.8–1.7)
Albumin: 3.8 g/dL (ref 3.4–5.0)
Alk Phosphatase: 94 U/L (ref 45–117)
Anion Gap: 5 mmol/L (ref 3.0–18)
BUN/Creatinine Ratio: 31 — ABNORMAL HIGH (ref 12–20)
BUN: 29 mg/dL — ABNORMAL HIGH (ref 7.0–18)
CO2: 30 mmol/L (ref 21–32)
Calcium: 9.8 mg/dL (ref 8.5–10.1)
Chloride: 105 mmol/L (ref 100–111)
Creatinine: 0.95 mg/dL (ref 0.6–1.3)
Est, Glom Filt Rate: 65 mL/min/{1.73_m2} (ref >60)
Globulin: 3.8 g/dL (ref 2.0–4.0)
Glucose: 92 mg/dL (ref 74–99)
Potassium: 3.9 mmol/L (ref 3.5–5.5)
Sodium: 140 mmol/L (ref 136–145)
Total Bilirubin: 0.4 mg/dL (ref 0.2–1.0)
Total Protein: 7.6 g/dL (ref 6.4–8.2)

## 2023-01-30 LAB — CBC WITH AUTO DIFFERENTIAL
Basophils %: 1 % (ref 0–2)
Basophils Absolute: 0.1 10*3/uL (ref 0.0–0.1)
Eosinophils %: 5 % (ref 0–5)
Eosinophils Absolute: 0.2 10*3/uL (ref 0.0–0.4)
Hematocrit: 40.2 % (ref 35.0–45.0)
Hemoglobin: 12.9 g/dL (ref 12.0–16.0)
Immature Granulocytes %: 0 % (ref 0.0–0.5)
Immature Granulocytes Absolute: 0 10*3/uL (ref 0.00–0.04)
Lymphocytes %: 32 % (ref 21–52)
Lymphocytes Absolute: 1.3 10*3/uL (ref 0.9–3.6)
MCH: 30.6 pg (ref 24.0–34.0)
MCHC: 32.1 g/dL (ref 31.0–37.0)
MCV: 95.5 fL (ref 78.0–100.0)
MPV: 11.4 fL (ref 9.2–11.8)
Monocytes %: 10 % (ref 3–10)
Monocytes Absolute: 0.4 10*3/uL (ref 0.05–1.2)
Neutrophils %: 51 % (ref 40–73)
Neutrophils Absolute: 2.1 10*3/uL (ref 1.8–8.0)
Nucleated RBCs: 0 /100{WBCs}
Platelets: 196 10*3/uL (ref 135–420)
RBC: 4.21 M/uL (ref 4.20–5.30)
RDW: 12.9 % (ref 11.6–14.5)
WBC: 4.1 10*3/uL — ABNORMAL LOW (ref 4.6–13.2)
nRBC: 0 10*3/uL (ref 0.00–0.01)

## 2023-01-30 LAB — TSH + FREE T4 PANEL
T4 Free: 1.1 ng/dL (ref 0.7–1.5)
TSH, 3rd Generation: 1.79 u[IU]/mL (ref 0.36–3.74)

## 2023-01-30 LAB — VITAMIN B12 & FOLATE
Folate: 8.3 ng/mL (ref 3.10–17.50)
Vitamin B-12: 432 pg/mL (ref 211–911)

## 2023-01-30 LAB — IRON: Iron: 172 ug/dL (ref 50–175)

## 2023-01-30 LAB — FERRITIN: Ferritin: 42 ng/mL (ref 8–388)

## 2023-01-30 NOTE — Patient Instructions (Signed)
Body Mass Index: Care Instructions  Your Care Instructions    Body mass index (BMI) can help you see if your weight is raising your risk for health problems. It uses a formula to compare how much you weigh with how tall you are.  A BMI lower than 18.5 is considered underweight.  A BMI between 18.5 and 24.9 is considered healthy.  A BMI between 25 and 29.9 is considered overweight. A BMI of 30 or higher is considered obese.  If your BMI is in the normal range, it means that you have a lower risk for weight-related health problems. If your BMI is in the overweight or obese range, you may be at increased risk for weight-related health problems, such as high blood pressure, heart disease, stroke, arthritis or joint pain, and diabetes. If your BMI is in the underweight range, you may be at increased risk for health problems such as fatigue, lower protection (immunity) against illness, muscle loss, bone loss, hair loss, and hormone problems.  BMI is just one measure of your risk for weight-related health problems. You may be at higher risk for health problems if you are not active, you eat an unhealthy diet, or you drink too much alcohol or use tobacco products.  Follow-up care is a key part of your treatment and safety. Be sure to make and go to all appointments, and call your doctor if you are having problems. It's also a good idea to know your test results and keep a list of the medicines you take.  How can you care for yourself at home?  Practice healthy eating habits. This includes eating plenty of fruits, vegetables, whole grains, lean protein, and low-fat dairy.  If your doctor recommends it, get more exercise. Walking is a good choice. Bit by bit, increase the amount you walk every day. Try for at least 30 minutes on most days of the week.  Do not smoke. Smoking can increase your risk for health problems. If you need help quitting, talk to your doctor about stop-smoking programs and medicines. These can  increase your chances of quitting for good.  Limit alcohol to 2 drinks a day for men and 1 drink a day for women. Too much alcohol can cause health problems.  If you have a BMI higher than 25  Your doctor may do other tests to check your risk for weight-related health problems. This may include measuring the distance around your waist. A waist measurement of more than 40 inches in men or 35 inches in women can increase the risk of weight-related health problems.  Talk with your doctor about steps you can take to stay healthy or improve your health. You may need to make lifestyle changes to lose weight and stay healthy, such as changing your diet and getting regular exercise.  If you have a BMI lower than 18.5  Your doctor may do other tests to check your risk for health problems.  Talk with your doctor about steps you can take to stay healthy or improve your health. You may need to make lifestyle changes to gain or maintain weight and stay healthy, such as getting more healthy foods in your diet and doing exercises to build muscle.  Where can you learn more?  Go to [employer].  Enter S176 in the search box to learn more about "Body Mass Index: Care Instructions."  Current as of: October 07, 2016  Content Version: 11.8   2006-2018 [employer]. Care instructions adapted under license by  [employer] (which disclaims liability or warranty for this information). If you have questions about a medical condition or this instruction, always ask your healthcare professional. [employer] disclaims any warranty or liability for your use of this information.

## 2023-01-30 NOTE — Progress Notes (Signed)
Subjective:     Cassandra Shah  is a 66 y.o. female who presents for follow-up about 13 years following sleeve resection. She does not remember her pre-op weight. The patient presents today to assess their progress toward their goal of weight loss and to address any issues that may be present.  Today the patient and I have reviewed their diet and how appropriate their food choices are.  The following issues have been identified - she has not been here in over 10 years (according to her).  I can not find any records of her care with [place] in Epic or Care Everywhere.  She is having no issues with PO intake but has regained approx 25 lbs from her lowest and is now having issues with knee arthritis.        01/30/2023     9:04 AM   Ambulatory Bariatric Summary   Systolic 139   Diastolic 68   Pulse 68   Temp 62.6 F (36.5 C)   Weight - Scale 183.3   Height 1.575 m (5\' 2" )   BMI 33.6 kg/m2   Weight - Scale 83.1 kg (183 lb 4.8 oz)   BMI (Calculated) 33.6     Surgery related complication: NA       She reports no issues with PO intake and is here looking for a 25 lb weight loss in an effort to improve her knee pain and denies vomiting, abdominal pain, diarrhea, and difficulty breathing.      Patients pain score: 0/10        01/30/2023     9:04 AM   Ambulatory Bariatric Summary   Systolic 139   Diastolic 68   Pulse 68   Temp 94.8 F (36.5 C)   Weight - Scale 183.3   Height 1.575 m (5\' 2" )   BMI 33.6 kg/m2   Weight - Scale 83.1 kg (183 lb 4.8 oz)   BMI (Calculated) 33.6        The patient's exercise level: moderately active.    Changes in her medical history and medications have been reviewed.    Patient Active Problem List   Diagnosis    HTN (hypertension)    History of kidney stones    Morbid obesity    Intestinal malabsorption    Tear of medial meniscus of right knee, current    Migraine     Past Medical History:   Diagnosis Date    Arthritis     GERD (gastroesophageal reflux disease)     Hypercholesterolemia      Hypertension 11/16/2009    Kidney stones     Migraines     Morbid obesity 11/16/2009    Seizures (HCC)     stress seizures after my mom died 15 years ago     Past Surgical History:   Procedure Laterality Date    ABDOMINOPLASTY  02/2003    APPENDECTOMY  2002    BREAST REDUCTION SURGERY Bilateral 02/2003    CHOLECYSTECTOMY, LAPAROSCOPIC  2000    GI  2011    sleeve resection    GI  2012    laparotomy    HYSTERECTOMY (CERVIX STATUS UNKNOWN)  1983    SALPINGO-OOPHORECTOMY Bilateral      Current Outpatient Medications   Medication Sig Dispense Refill    atorvastatin (LIPITOR) 40 MG tablet Take 1 tablet by mouth daily      butalbital-acetaminophen-caffeine (FIORICET, ESGIC) 50-325-40 MG per tablet Take 1 tablet by mouth every 4 hours  as needed      valsartan-hydroCHLOROthiazide (DIOVAN-HCT) 160-25 MG per tablet Take 1 tablet by mouth daily      metoprolol succinate (TOPROL XL) 25 MG extended release tablet Take 1 tablet by mouth daily       No current facility-administered medications for this visit.        Review of Symptoms:       General - No history or complaints of unexpected fever or chills  Head/Neck - No history or complaints of headache or dizziness  Cardiac - No history or complaints of chest pain, palpitations, or shortness of breath  Pulmonary - No history or complaints of shortness of breath or productive cough  Gastrointestinal - as noted above  Genitourinary - No history or complaints of hematuria/dysuria or renal lithiasis  Musculoskeletal - No history or complaints of joint  muscular weakness  Hematologic - No history of any bleeding episodes  Neurologic - No history or complaints of  migraine headaches or neurologic symptoms                     Objective:     BP 139/68 (Site: Left Upper Arm, Position: Sitting, Cuff Size: Large Adult)   Pulse 68   Temp 97.7 F (36.5 C)   Ht 1.575 m (5\' 2" )   Wt 83.1 kg (183 lb 4.8 oz)   SpO2 100%   BMI 33.53 kg/m       Physical Examination:     General appearance:   alert, cooperative, no distress, appears stated age   Mental status   alert, oriented to person, place, and time   Neck  supple, no significant adenopathy     Chest  clear to auscultation, no wheezes, rales or rhonchi, symmetric air entry   Heart  normal rate, regular rhythm, normal S1, S2, no murmurs, rubs, clicks or gallops    Abdomen: soft, nontender, nondistended, no masses or organomegaly   Incision:  healing well, no drainage, no erythema, no hernia, no seroma, no swelling, no dehiscence, incision well approximated      Neurological  alert, oriented, normal speech, no focal findings or movement disorder noted   Extremities peripheral pulses normal, no pedal edema, no clubbing or cyanosis         Lab Results   Component Value Date/Time    WBC 4.5 10/29/2017 12:45 PM    HGB 12.5 10/29/2017 12:45 PM    HCT 38.8 10/29/2017 12:45 PM    PLT 239 10/29/2017 12:45 PM    MCV 93.0 10/29/2017 12:45 PM     Lab Results   Component Value Date/Time    NA 143 10/29/2017 12:45 PM    K 4.2 10/29/2017 12:45 PM    CL 107 10/29/2017 12:45 PM    CO2 30 10/29/2017 12:45 PM    BUN 25 10/29/2017 12:45 PM    GFRAA >60 10/29/2017 12:45 PM    GLOB 3.4 10/29/2017 12:45 PM    ALT 24 10/29/2017 12:45 PM     No results found for: "IRON", "TIBC"  No results found for: "RBCF"  No results found for: "VITD3"      @[facility] @    Assessment:     1. History of Morbid obesity, status post sleeve resection. The patient is having arthritic pains after a 25 lb weight regain.  She is here hoping to re-establish with the office for dietary education and motivation.  She does not want further weight loss surgery.  We have  had a long discussion on the proper post gastric bypass diet as well as the need for increased exercise.    Routine labs with TSH ordered today.    She declines an UGI as she reports a proper level of restriction with her PO intake    She wishes to meet with dietary staff for a more indepth discussion of her diet     Plan:     Remember to  measure portions, continue low carbohydrate diet  Continue to concentrate on protein intake meeting daily requirements  Remember vitamin supplements. The importance of such was discussed regarding the malabsorptive issues that the surgery creates.  Exercise regimen appears to be: inadequate  Try and attend support group if feasible.  Follow-up in 1 year(s).  Lab reviewed and appropriate changes made.  Total time spent with the patient 30 minutes.

## 2023-01-31 LAB — VITAMIN D 25 HYDROXY: Vit D, 25-Hydroxy: 31.5 ng/mL (ref 30–100)

## 2023-02-03 LAB — VITAMIN B1, WHOLE BLOOD: Vitamin B1,Whole Blood: 91 nmol/L (ref 66.5–200.0)

## 2023-02-13 ENCOUNTER — Ambulatory Visit (HOSPITAL_COMMUNITY): Payer: Self-pay

## 2023-02-28 ENCOUNTER — Other Ambulatory Visit (RURAL_HEALTH_CENTER): Payer: Self-pay | Admitting: Family Medicine

## 2023-02-28 DIAGNOSIS — I10 Essential (primary) hypertension: Secondary | ICD-10-CM

## 2023-03-06 ENCOUNTER — Emergency Department (HOSPITAL_COMMUNITY): Payer: Self-pay

## 2023-03-06 ENCOUNTER — Emergency Department (HOSPITAL_COMMUNITY): Payer: Medicare (Managed Care)

## 2023-03-06 ENCOUNTER — Other Ambulatory Visit: Payer: Self-pay

## 2023-03-06 ENCOUNTER — Emergency Department
Admission: EM | Admit: 2023-03-06 | Discharge: 2023-03-07 | Disposition: A | Discharge status: Home / Self Care | Payer: Medicare (Managed Care) | Attending: Emergency Medicine | Admitting: Emergency Medicine

## 2023-03-06 DIAGNOSIS — S298XXA Other specified injuries of thorax, initial encounter: Secondary | ICD-10-CM

## 2023-03-06 DIAGNOSIS — S0990XA Unspecified injury of head, initial encounter: Secondary | ICD-10-CM

## 2023-03-06 DIAGNOSIS — M545 Low back pain, unspecified: Secondary | ICD-10-CM | POA: Insufficient documentation

## 2023-03-06 DIAGNOSIS — S20211A Contusion of right front wall of thorax, initial encounter: Secondary | ICD-10-CM | POA: Insufficient documentation

## 2023-03-06 DIAGNOSIS — S0101XA Laceration without foreign body of scalp, initial encounter: Secondary | ICD-10-CM | POA: Insufficient documentation

## 2023-03-06 LAB — CBC WITH DIFF
BASOPHIL #: <0.1 10*3/uL (ref <=0.20)
BASOPHIL %: 0.9 %
EOSINOPHIL #: <0.1 10*3/uL (ref <=0.50)
EOSINOPHIL %: 0.7 %
HCT: 39.4 % (ref 34.8–46.0)
HGB: 13.5 g/dL (ref 11.5–16.0)
IMMATURE GRANULOCYTE #: <0.1 10*3/uL (ref <0.10)
IMMATURE GRANULOCYTE %: 0.4 % (ref 0.0–1.0)
LYMPHOCYTE #: 2.46 10*3/uL (ref 1.00–4.80)
LYMPHOCYTE %: 23.8 %
MCH: 32.6 pg — ABNORMAL HIGH (ref 26.0–32.0)
MCHC: 34.3 g/dL (ref 31.0–35.5)
MCV: 95.2 fL (ref 78.0–100.0)
MONOCYTE #: 0.54 10*3/uL (ref 0.20–1.10)
MONOCYTE %: 5.2 %
MPV: 11.2 fL (ref 8.7–12.5)
NEUTROPHIL #: 7.13 10*3/uL (ref 1.50–7.70)
NEUTROPHIL %: 69 %
PLATELETS: 204 10*3/uL (ref 150–400)
RBC: 4.14 10*6/uL (ref 3.85–5.22)
RDW-CV: 12.9 % (ref 11.5–15.5)
WBC: 10.3 10*3/uL (ref 3.7–11.0)

## 2023-03-06 LAB — BASIC METABOLIC PANEL
ANION GAP: 8 mmol/L (ref 4–13)
BUN/CREA RATIO: 14 (ref 6–22)
BUN: 14 mg/dL (ref 8–25)
CALCIUM: 9.2 mg/dL (ref 8.6–10.3)
CHLORIDE: 109 mmol/L (ref 96–111)
CO2 TOTAL: 23 mmol/L (ref 23–31)
CREATININE: 0.97 mg/dL (ref 0.60–1.05)
ESTIMATED GFR - FEMALE: 64 mL/min/BSA (ref >=60)
GLUCOSE: 145 mg/dL — ABNORMAL HIGH (ref 65–125)
POTASSIUM: 3.7 mmol/L (ref 3.5–5.1)
SODIUM: 140 mmol/L (ref 136–145)

## 2023-03-06 MED ORDER — HYDROCODONE 10 MG-ACETAMINOPHEN 325 MG TABLET
1.0000 | ORAL_TABLET | ORAL | Status: AC
Start: 2023-03-06 — End: 2023-03-06
  Administered 2023-03-06: 1 via ORAL
  Filled 2023-03-06: qty 1

## 2023-03-06 MED ORDER — LIDOCAINE 1 %-EPINEPHRINE 1:100,000 INJECTION SOLUTION
15.0000 mL | INTRAMUSCULAR | Status: AC
Start: 2023-03-06 — End: 2023-03-07
  Administered 2023-03-07: 150 mg via INTRADERMAL
  Filled 2023-03-06: qty 20
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
## 2023-03-06 NOTE — ED Nurses Note (Signed)
Pt to and from CT with out incident

## 2023-03-07 LAB — GRAY TOP TUBE

## 2023-03-07 LAB — GOLD TOP TUBE

## 2023-03-07 LAB — BLUE TOP TUBE

## 2023-03-07 MED ORDER — HYDROCODONE 7.5 MG-ACETAMINOPHEN 325 MG TABLET
1.0000 | ORAL_TABLET | Freq: Four times a day (QID) | ORAL | 0 refills | Status: DC | PRN
Start: 2023-03-07 — End: 2023-03-10

## 2023-03-07 MED ORDER — HYDROCODONE-ACETAMINOPHEN 5 MG-325 MG TABLET - ED TO GO MED
1.0000 | ORAL_TABLET | ORAL | Status: AC
Start: 2023-03-07 — End: 2023-03-07
  Administered 2023-03-07: 2 via ORAL
  Filled 2023-03-07: qty 2

## 2023-03-07 MED ORDER — METHOCARBAMOL 750 MG TABLET
1500.0000 mg | ORAL_TABLET | Freq: Three times a day (TID) | ORAL | 0 refills | Status: DC | PRN
Start: 2023-03-07 — End: 2023-03-10

## 2023-03-07 NOTE — Discharge Instructions (Signed)
Your CT scans show no bleeding around your brain, no broken ribs and no other broken bones in your back.  You may have a concussion.  Concussion symptoms include headache, dizziness, troubles concentrating, mild confusion, nausea and occasional vomiting.  It can take several months for concussion symptoms to resolve fully.  Staples should be removed in about 10 days.  You can take the hydrocodone and Robaxin as needed for chest and back pain.  Return to the emergency department for any concerns for infection to your lacerations or uncontrolled pain elsewhere.

## 2023-03-07 NOTE — ED Nurses Note (Signed)
AVS provided. Pt educated on medications, home care, and follow up. Understanding verbalized. Pt discharged home via POV and wheel chair.

## 2023-03-15 DIAGNOSIS — S0101XA Laceration without foreign body of scalp, initial encounter: Secondary | ICD-10-CM | POA: Insufficient documentation

## 2023-03-16 ENCOUNTER — Other Ambulatory Visit: Payer: Self-pay

## 2023-03-16 ENCOUNTER — Encounter (RURAL_HEALTH_CENTER): Payer: Self-pay | Admitting: Family Medicine

## 2023-03-16 ENCOUNTER — Ambulatory Visit: Payer: Medicare (Managed Care) | Attending: Family Medicine | Admitting: Family Medicine

## 2023-03-16 DIAGNOSIS — S0101XD Laceration without foreign body of scalp, subsequent encounter: Secondary | ICD-10-CM | POA: Insufficient documentation

## 2023-03-16 DIAGNOSIS — I509 Heart failure, unspecified: Secondary | ICD-10-CM | POA: Insufficient documentation

## 2023-03-16 DIAGNOSIS — E039 Hypothyroidism, unspecified: Secondary | ICD-10-CM

## 2023-03-16 DIAGNOSIS — K219 Gastro-esophageal reflux disease without esophagitis: Secondary | ICD-10-CM | POA: Insufficient documentation

## 2023-03-16 DIAGNOSIS — F411 Generalized anxiety disorder: Secondary | ICD-10-CM | POA: Insufficient documentation

## 2023-03-16 DIAGNOSIS — Z955 Presence of coronary angioplasty implant and graft: Secondary | ICD-10-CM

## 2023-03-16 DIAGNOSIS — I10 Essential (primary) hypertension: Secondary | ICD-10-CM

## 2023-03-16 DIAGNOSIS — I110 Hypertensive heart disease with heart failure: Secondary | ICD-10-CM | POA: Insufficient documentation

## 2023-03-16 DIAGNOSIS — S0990XD Unspecified injury of head, subsequent encounter: Secondary | ICD-10-CM | POA: Insufficient documentation

## 2023-03-16 DIAGNOSIS — Z79899 Other long term (current) drug therapy: Secondary | ICD-10-CM | POA: Insufficient documentation

## 2023-03-16 DIAGNOSIS — S0990XA Unspecified injury of head, initial encounter: Secondary | ICD-10-CM | POA: Insufficient documentation

## 2023-03-16 DIAGNOSIS — F17210 Nicotine dependence, cigarettes, uncomplicated: Secondary | ICD-10-CM | POA: Insufficient documentation

## 2023-03-16 DIAGNOSIS — E782 Mixed hyperlipidemia: Secondary | ICD-10-CM | POA: Insufficient documentation

## 2023-03-16 MED ORDER — CLONAZEPAM 1 MG TABLET
1.0000 mg | ORAL_TABLET | Freq: Three times a day (TID) | ORAL | 3 refills | Status: DC
Start: 2023-03-16 — End: 2023-07-30
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
## 2023-03-17 NOTE — Result Encounter Note (Signed)
I called the patient.  She will follow up with Dr. Ladona Ridgel about this result.

## 2023-03-18 ENCOUNTER — Telehealth (RURAL_HEALTH_CENTER): Payer: Self-pay | Admitting: Family Medicine

## 2023-03-18 ENCOUNTER — Other Ambulatory Visit (RURAL_HEALTH_CENTER): Payer: Self-pay | Admitting: Family Medicine

## 2023-03-18 DIAGNOSIS — M549 Dorsalgia, unspecified: Secondary | ICD-10-CM

## 2023-03-18 MED ORDER — TIZANIDINE 4 MG TABLET
4.0000 mg | ORAL_TABLET | Freq: Three times a day (TID) | ORAL | 0 refills | Status: DC
Start: 2023-03-18 — End: 2023-08-20

## 2023-03-18 NOTE — Telephone Encounter (Signed)
Referral was faxed to [facility] per patients request, Tizanidine 4mg  TID routed.
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
## 2023-03-23 ENCOUNTER — Other Ambulatory Visit: Payer: Self-pay

## 2023-03-23 ENCOUNTER — Ambulatory Visit: Payer: Medicare (Managed Care) | Attending: Family Medicine | Admitting: Family Medicine

## 2023-03-23 ENCOUNTER — Encounter (RURAL_HEALTH_CENTER): Payer: Self-pay | Admitting: Family Medicine

## 2023-03-23 VITALS — BP 128/65 | HR 74 | Temp 97.5°F | Resp 17 | Ht 67.99 in | Wt 189.2 lb

## 2023-03-23 DIAGNOSIS — S0990XD Unspecified injury of head, subsequent encounter: Secondary | ICD-10-CM

## 2023-03-23 DIAGNOSIS — F17210 Nicotine dependence, cigarettes, uncomplicated: Secondary | ICD-10-CM | POA: Insufficient documentation

## 2023-03-23 DIAGNOSIS — R918 Other nonspecific abnormal finding of lung field: Secondary | ICD-10-CM | POA: Insufficient documentation

## 2023-03-23 DIAGNOSIS — J449 Chronic obstructive pulmonary disease, unspecified: Secondary | ICD-10-CM

## 2023-03-23 DIAGNOSIS — E782 Mixed hyperlipidemia: Secondary | ICD-10-CM | POA: Insufficient documentation

## 2023-03-23 DIAGNOSIS — S0101XD Laceration without foreign body of scalp, subsequent encounter: Secondary | ICD-10-CM

## 2023-03-23 DIAGNOSIS — F411 Generalized anxiety disorder: Secondary | ICD-10-CM | POA: Insufficient documentation

## 2023-03-23 DIAGNOSIS — I110 Hypertensive heart disease with heart failure: Secondary | ICD-10-CM | POA: Insufficient documentation

## 2023-03-23 DIAGNOSIS — Z79899 Other long term (current) drug therapy: Secondary | ICD-10-CM | POA: Insufficient documentation

## 2023-03-23 DIAGNOSIS — E039 Hypothyroidism, unspecified: Secondary | ICD-10-CM

## 2023-03-23 DIAGNOSIS — Z801 Family history of malignant neoplasm of trachea, bronchus and lung: Secondary | ICD-10-CM | POA: Insufficient documentation

## 2023-03-23 DIAGNOSIS — I10 Essential (primary) hypertension: Secondary | ICD-10-CM

## 2023-03-23 DIAGNOSIS — I509 Heart failure, unspecified: Secondary | ICD-10-CM | POA: Insufficient documentation

## 2023-03-23 DIAGNOSIS — K219 Gastro-esophageal reflux disease without esophagitis: Secondary | ICD-10-CM | POA: Insufficient documentation

## 2023-03-23 DIAGNOSIS — I2510 Atherosclerotic heart disease of native coronary artery without angina pectoris: Secondary | ICD-10-CM

## 2023-03-23 DIAGNOSIS — R519 Headache, unspecified: Secondary | ICD-10-CM | POA: Insufficient documentation
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
## 2023-04-01 ENCOUNTER — Ambulatory Visit (HOSPITAL_COMMUNITY): Payer: Self-pay

## 2023-04-01 ENCOUNTER — Other Ambulatory Visit (RURAL_HEALTH_CENTER): Payer: Self-pay | Admitting: Family Medicine

## 2023-04-01 DIAGNOSIS — Z1231 Encounter for screening mammogram for malignant neoplasm of breast: Secondary | ICD-10-CM

## 2023-04-16 ENCOUNTER — Other Ambulatory Visit (RURAL_HEALTH_CENTER): Payer: Self-pay | Admitting: Family Medicine

## 2023-04-16 ENCOUNTER — Telehealth (RURAL_HEALTH_CENTER): Payer: Self-pay | Admitting: Family Medicine

## 2023-04-16 ENCOUNTER — Other Ambulatory Visit: Payer: Self-pay

## 2023-04-16 ENCOUNTER — Encounter (RURAL_HEALTH_CENTER): Payer: Self-pay | Admitting: Family Medicine

## 2023-04-16 ENCOUNTER — Ambulatory Visit: Payer: Medicare (Managed Care) | Attending: Family Medicine | Admitting: Family Medicine

## 2023-04-16 DIAGNOSIS — M5410 Radiculopathy, site unspecified: Secondary | ICD-10-CM | POA: Insufficient documentation

## 2023-04-16 DIAGNOSIS — S5001XD Contusion of right elbow, subsequent encounter: Secondary | ICD-10-CM | POA: Insufficient documentation

## 2023-04-16 DIAGNOSIS — M549 Dorsalgia, unspecified: Secondary | ICD-10-CM | POA: Insufficient documentation

## 2023-04-16 DIAGNOSIS — J449 Chronic obstructive pulmonary disease, unspecified: Secondary | ICD-10-CM

## 2023-04-16 DIAGNOSIS — S5001XA Contusion of right elbow, initial encounter: Secondary | ICD-10-CM | POA: Insufficient documentation

## 2023-04-16 DIAGNOSIS — S0101XD Laceration without foreign body of scalp, subsequent encounter: Secondary | ICD-10-CM | POA: Insufficient documentation

## 2023-04-16 DIAGNOSIS — S0990XD Unspecified injury of head, subsequent encounter: Secondary | ICD-10-CM | POA: Insufficient documentation

## 2023-04-16 DIAGNOSIS — M5442 Lumbago with sciatica, left side: Secondary | ICD-10-CM

## 2023-04-16 MED ORDER — IBUPROFEN 600 MG TABLET
600.0000 mg | ORAL_TABLET | Freq: Three times a day (TID) | ORAL | 2 refills | Status: DC | PRN
Start: 2023-04-16 — End: 2024-02-18

## 2023-04-16 MED ORDER — ALBUTEROL SULFATE HFA 90 MCG/ACTUATION AEROSOL INHALER
2.0000 | INHALATION_SPRAY | RESPIRATORY_TRACT | 5 refills | Status: AC | PRN
Start: 2023-04-16 — End: ?

## 2023-04-16 NOTE — Telephone Encounter (Signed)
Albuterol inhaler routed, patient notified.

## 2023-04-16 NOTE — Telephone Encounter (Signed)
 Patient is requesting an albuterol inhaler
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
## 2023-04-27 ENCOUNTER — Ambulatory Visit (RURAL_HEALTH_CENTER): Payer: Self-pay | Admitting: Family Medicine

## 2023-05-07 ENCOUNTER — Ambulatory Visit (HOSPITAL_COMMUNITY): Payer: Self-pay

## 2023-05-18 ENCOUNTER — Ambulatory Visit (RURAL_HEALTH_CENTER): Payer: Self-pay | Admitting: Family Medicine

## 2023-05-18 DIAGNOSIS — K219 Gastro-esophageal reflux disease without esophagitis: Secondary | ICD-10-CM

## 2023-05-18 DIAGNOSIS — M5410 Radiculopathy, site unspecified: Secondary | ICD-10-CM

## 2023-05-18 DIAGNOSIS — M159 Polyosteoarthritis, unspecified: Secondary | ICD-10-CM

## 2023-05-18 DIAGNOSIS — M25571 Pain in right ankle and joints of right foot: Secondary | ICD-10-CM

## 2023-05-24 ENCOUNTER — Other Ambulatory Visit (RURAL_HEALTH_CENTER): Payer: Self-pay | Admitting: Family Medicine

## 2023-05-24 DIAGNOSIS — I509 Heart failure, unspecified: Secondary | ICD-10-CM

## 2023-05-24 DIAGNOSIS — K219 Gastro-esophageal reflux disease without esophagitis: Secondary | ICD-10-CM

## 2023-05-24 DIAGNOSIS — I10 Essential (primary) hypertension: Secondary | ICD-10-CM

## 2023-05-26 ENCOUNTER — Other Ambulatory Visit (RURAL_HEALTH_CENTER): Payer: Self-pay | Admitting: Family Medicine

## 2023-05-26 DIAGNOSIS — I509 Heart failure, unspecified: Secondary | ICD-10-CM

## 2023-05-28 ENCOUNTER — Ambulatory Visit: Payer: Medicare PPO | Attending: Family Medicine | Admitting: Family Medicine

## 2023-05-28 ENCOUNTER — Encounter (RURAL_HEALTH_CENTER): Payer: Self-pay | Admitting: Family Medicine

## 2023-05-28 ENCOUNTER — Other Ambulatory Visit: Payer: Self-pay

## 2023-05-28 VITALS — BP 130/70 | HR 86 | Temp 97.5°F | Resp 17 | Ht 67.99 in | Wt 188.8 lb

## 2023-05-28 DIAGNOSIS — G4762 Sleep related leg cramps: Secondary | ICD-10-CM | POA: Insufficient documentation

## 2023-05-28 DIAGNOSIS — Z79899 Other long term (current) drug therapy: Secondary | ICD-10-CM | POA: Insufficient documentation

## 2023-05-28 DIAGNOSIS — K219 Gastro-esophageal reflux disease without esophagitis: Secondary | ICD-10-CM | POA: Insufficient documentation

## 2023-05-28 DIAGNOSIS — S0101XD Laceration without foreign body of scalp, subsequent encounter: Secondary | ICD-10-CM | POA: Insufficient documentation

## 2023-05-28 DIAGNOSIS — E782 Mixed hyperlipidemia: Secondary | ICD-10-CM | POA: Insufficient documentation

## 2023-05-28 DIAGNOSIS — M159 Polyosteoarthritis, unspecified: Secondary | ICD-10-CM | POA: Insufficient documentation

## 2023-05-28 DIAGNOSIS — I2510 Atherosclerotic heart disease of native coronary artery without angina pectoris: Secondary | ICD-10-CM | POA: Insufficient documentation

## 2023-05-28 DIAGNOSIS — I10 Essential (primary) hypertension: Secondary | ICD-10-CM | POA: Insufficient documentation

## 2023-05-28 DIAGNOSIS — M542 Cervicalgia: Secondary | ICD-10-CM | POA: Insufficient documentation

## 2023-05-28 DIAGNOSIS — S0101XS Laceration without foreign body of scalp, sequela: Secondary | ICD-10-CM | POA: Insufficient documentation

## 2023-05-28 DIAGNOSIS — M545 Low back pain, unspecified: Secondary | ICD-10-CM | POA: Insufficient documentation

## 2023-05-28 DIAGNOSIS — J309 Allergic rhinitis, unspecified: Secondary | ICD-10-CM | POA: Insufficient documentation

## 2023-05-28 DIAGNOSIS — Z79890 Hormone replacement therapy (postmenopausal): Secondary | ICD-10-CM | POA: Insufficient documentation

## 2023-05-28 DIAGNOSIS — N760 Acute vaginitis: Secondary | ICD-10-CM | POA: Insufficient documentation

## 2023-05-28 DIAGNOSIS — F411 Generalized anxiety disorder: Secondary | ICD-10-CM | POA: Insufficient documentation

## 2023-05-28 DIAGNOSIS — G8929 Other chronic pain: Secondary | ICD-10-CM | POA: Insufficient documentation

## 2023-05-28 DIAGNOSIS — J449 Chronic obstructive pulmonary disease, unspecified: Secondary | ICD-10-CM | POA: Insufficient documentation

## 2023-05-28 DIAGNOSIS — S5001XA Contusion of right elbow, initial encounter: Secondary | ICD-10-CM | POA: Insufficient documentation

## 2023-05-28 DIAGNOSIS — M5410 Radiculopathy, site unspecified: Secondary | ICD-10-CM | POA: Insufficient documentation

## 2023-05-28 DIAGNOSIS — E039 Hypothyroidism, unspecified: Secondary | ICD-10-CM | POA: Insufficient documentation

## 2023-05-28 DIAGNOSIS — F17210 Nicotine dependence, cigarettes, uncomplicated: Secondary | ICD-10-CM | POA: Insufficient documentation

## 2023-05-28 DIAGNOSIS — S5001XD Contusion of right elbow, subsequent encounter: Secondary | ICD-10-CM | POA: Insufficient documentation

## 2023-05-28 MED ORDER — MICONAZOLE NITRATE 2 % VAGINAL CREAM
1.0000 | TOPICAL_CREAM | Freq: Every evening | VAGINAL | 1 refills | Status: DC
Start: 2023-05-28 — End: 2024-01-27
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
## 2023-06-21 ENCOUNTER — Other Ambulatory Visit (RURAL_HEALTH_CENTER): Payer: Self-pay | Admitting: Family Medicine

## 2023-06-21 DIAGNOSIS — I509 Heart failure, unspecified: Secondary | ICD-10-CM

## 2023-06-24 ENCOUNTER — Ambulatory Visit (RURAL_HEALTH_CENTER): Payer: Self-pay | Admitting: Family Medicine

## 2023-06-28 ENCOUNTER — Other Ambulatory Visit (RURAL_HEALTH_CENTER): Payer: Self-pay | Admitting: Family Medicine

## 2023-06-28 DIAGNOSIS — I10 Essential (primary) hypertension: Secondary | ICD-10-CM

## 2023-07-03 ENCOUNTER — Other Ambulatory Visit (RURAL_HEALTH_CENTER): Payer: Self-pay | Admitting: Family Medicine

## 2023-07-03 DIAGNOSIS — E039 Hypothyroidism, unspecified: Secondary | ICD-10-CM

## 2023-07-06 ENCOUNTER — Ambulatory Visit (RURAL_HEALTH_CENTER): Payer: Self-pay | Admitting: Family Medicine

## 2023-07-14 ENCOUNTER — Ambulatory Visit (RURAL_HEALTH_CENTER): Payer: Self-pay | Admitting: Family Medicine

## 2023-07-17 ENCOUNTER — Telehealth (RURAL_HEALTH_CENTER): Payer: Self-pay | Admitting: Family Medicine
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
## 2023-07-20 ENCOUNTER — Encounter (RURAL_HEALTH_CENTER): Payer: Self-pay | Admitting: Family Medicine

## 2023-07-21 ENCOUNTER — Encounter (INDEPENDENT_AMBULATORY_CARE_PROVIDER_SITE_OTHER): Payer: Self-pay | Admitting: INTERVENTIONAL CARDIOLOGY

## 2023-07-21 ENCOUNTER — Ambulatory Visit: Payer: Self-pay | Attending: INTERVENTIONAL CARDIOLOGY | Admitting: INTERVENTIONAL CARDIOLOGY

## 2023-07-21 ENCOUNTER — Other Ambulatory Visit: Payer: Self-pay

## 2023-07-21 ENCOUNTER — Other Ambulatory Visit (INDEPENDENT_AMBULATORY_CARE_PROVIDER_SITE_OTHER): Payer: Self-pay | Admitting: INTERVENTIONAL CARDIOLOGY

## 2023-07-21 VITALS — BP 104/71 | HR 83 | Resp 18 | Ht 67.0 in | Wt 194.0 lb

## 2023-07-21 DIAGNOSIS — I2510 Atherosclerotic heart disease of native coronary artery without angina pectoris: Secondary | ICD-10-CM

## 2023-07-21 DIAGNOSIS — I10 Essential (primary) hypertension: Secondary | ICD-10-CM | POA: Insufficient documentation

## 2023-07-21 DIAGNOSIS — Z79899 Other long term (current) drug therapy: Secondary | ICD-10-CM | POA: Insufficient documentation

## 2023-07-21 DIAGNOSIS — E669 Obesity, unspecified: Secondary | ICD-10-CM | POA: Insufficient documentation

## 2023-07-21 DIAGNOSIS — R0789 Other chest pain: Secondary | ICD-10-CM | POA: Insufficient documentation

## 2023-07-21 DIAGNOSIS — Z955 Presence of coronary angioplasty implant and graft: Secondary | ICD-10-CM | POA: Insufficient documentation

## 2023-07-21 DIAGNOSIS — Z6830 Body mass index (BMI) 30.0-30.9, adult: Secondary | ICD-10-CM | POA: Insufficient documentation

## 2023-07-21 DIAGNOSIS — F17210 Nicotine dependence, cigarettes, uncomplicated: Secondary | ICD-10-CM | POA: Insufficient documentation

## 2023-07-21 DIAGNOSIS — Z7982 Long term (current) use of aspirin: Secondary | ICD-10-CM | POA: Insufficient documentation

## 2023-07-21 DIAGNOSIS — R0602 Shortness of breath: Secondary | ICD-10-CM | POA: Insufficient documentation

## 2023-07-21 MED ORDER — NITROGLYCERIN 0.4 MG SUBLINGUAL TABLET: SUBLINGUAL_TABLET | SUBLINGUAL | 0 refills | Status: DC

## 2023-07-21 NOTE — Progress Notes (Signed)
 CARDIOLOGY, AMBULATORY CARE CENTER  [address]   [place] [address]  Operated by [facility]  Progress Note        Date:  07/21/2023   Name:  [name] [name]   MRN:  [id number]   DOB:  21-May-1955       Chief Complaint   Patient presents with    Heart Disease     Coronary artery disease     There are no exam notes on file for this visit.    Subjective: [name] [name] is a 68 year old lady seen on follow-up for coronary artery disease.    She has had repeated stenting of the obtuse marginal artery.    In clinic today she complains of some shortness of breath.    However she states that she has a history of chronic obstructive pulmonary disease.    She also complains of occasional chest heaviness.    Historically:  She was initially seen on consultation at the request of Dr. Ricarda Frame for evaluation of coronary artery disease.    She has a history of drug-eluting stent to the obtuse marginal artery on 12/25/2014.  She had previous stenting of the same vessel in 2015.  She previously followed up with Dr. Janeece Fitting.    Medical History     I have reviewed and updated as appropriate the past medical, family and social history today:  Past Medical History  Past Medical History:   Diagnosis Date    CAD (coronary artery disease) 05/05/2018    CHF (congestive heart failure) 05/05/2018    COPD (chronic obstructive pulmonary disease) 05/05/2018    GAD (generalized anxiety disorder) 05/05/2018    GERD (gastroesophageal reflux disease) 05/05/2018    History of heart artery stent 05/05/2018    Hyperlipidemia 05/05/2018    Hypertension 05/05/2018    Hypothyroidism 05/05/2018    Nicotine dependence, cigarettes, uncomplicated 05/05/2018    Right shoulder pain 05/05/2018     Family History  Family Medical History:       Problem Relation (Age of Onset)    Breast Cancer Sister    Cancer Other    Diabetes Other    Lymphoma Other          Surgical History  Past Surgical History:   Procedure Laterality Date     CORONARY ARTERY ANGIOPLASTY      HX APPENDECTOMY      HX CESAREAN SECTION      HX HEART CATHETERIZATION      LEG SURGERY Right      Social History  Social History     Socioeconomic History    Marital status: Widowed   Occupational History    Occupation: Retired   Tobacco Use    Smoking status: Every Day     Current packs/day: 0.75     Average packs/day: 0.8 packs/day for 53.3 years (39.9 ttl pk-yrs)     Types: Cigarettes     Start date: 1972     Passive exposure: Current    Smokeless tobacco: Never   Vaping Use    Vaping status: Never Used   Substance and Sexual Activity    Alcohol use: Yes     Comment: occasional    Drug use: Never    Sexual activity: Yes     Partners: Male     Social Determinants of Health     Financial Resource Strain: Low Risk  (04/16/2023)    Financial Resource Strain     SDOH  Financial: No   Transportation Needs: High Risk (04/16/2023)    Transportation Needs     SDOH Transportation: Yes, it has kept me from medical appointments or from getting my medications   Social Connections: Low Risk  (04/16/2023)    Social Connections     SDOH Social Isolation: 5 or more times a week   Intimate Partner Violence: Low Risk  (04/16/2023)    Intimate Partner Violence     SDOH Domestic Violence: No   Housing Stability: Low Risk  (04/16/2023)    Housing Stability     SDOH Housing Situation: I have housing.     SDOH Housing Worry: No     Medication  Current Outpatient Medications   Medication Sig    albuterol sulfate (PROVENTIL OR VENTOLIN OR PROAIR) 90 mcg/actuation Inhalation oral inhaler Take 2 Puffs by inhalation Every 4 hours as needed    aspirin (ECOTRIN) 81 mg Oral Tablet, Delayed Release (E.C.) Take 1 Tablet (81 mg total) by mouth Daily    atorvastatin (LIPITOR) 80 mg Oral Tablet Take 1 tablet by mouth once daily    carvediloL (COREG) 6.25 mg Oral Tablet Take 1 tablet by mouth twice daily with food    clonazePAM (KLONOPIN) 1 mg Oral Tablet Take 1 Tablet (1 mg total) by mouth Three times a day    furosemide  (LASIX) 20 mg Oral Tablet Take 1 tablet by mouth twice daily as needed    Ibuprofen (MOTRIN) 600 mg Oral Tablet Take 1 Tablet (600 mg total) by mouth Three times a day as needed for Pain    levothyroxine (SYNTHROID) 125 mcg Oral Tablet TAKE 1 TABLET BY MOUTH ONCE DAILY IN THE MORNING    lisinopriL (PRINIVIL) 10 mg Oral Tablet Take 1 tablet by mouth once daily    loratadine (CLARITIN) 10 mg Oral Tablet Take 1 Tablet (10 mg total) by mouth Once a day    miconazole nitrate (MONISTAT) 2 % Vaginal Cream Insert 1 Applicator into the vagina Every night    nitroGLYCERIN (NITROSTAT) 0.4 mg Sublingual Tablet, Sublingual DISSOLVE ONE TABLET UNDER THE TONGUE EVERY 5 MINUTES AS NEEDED FOR CHEST PAIN.  DO NOT EXCEED A TOTAL OF 3 DOSES IN 15 MINUTES    pantoprazole (PROTONIX) 40 mg Oral Tablet, Delayed Release (E.C.) Take 1 tablet by mouth once daily    prednisoLONE acetate (PRED FORTE) 1 % Ophthalmic Drops, Suspension Instill 1 Drop into both eyes Four times a day    tiZANidine (ZANAFLEX) 4 mg Oral Tablet Take 1 Tablet (4 mg total) by mouth Three times a day     Allergies  Allergies   Allergen Reactions    Sulfa (Sulfonamides) Hives/ Urticaria     Objective   BP 104/71   Pulse 83   Resp 18   Ht 1.702 m (5\' 7" )   Wt 88 kg (194 lb)   SpO2 96%   BMI 30.38 kg/m     Physical Exam:  Exam Narrative:  General appearance:  Patient is obese and not in distress.  Cardiovascular system:  Normal S1 and S2 heart sounds are noted, with a regular rhythm. There is no murmur.    Respiratory system:  The respiratory effort is not labored but, the lungs show basal rales on auscultation.  Psychiatric system:  The patient is awake and responsive and oriented.  The affect is normal.  Results and Attestation   EKG results: Image reviewed (TWELVE LEAD EKG TRACING WHICH I HAVE INDEPENDENTLY VISUALIZED AND INTERPRETED SHOWS:  Sinus rhythm with normal axis.)    Labs:  On 03/06/2023:   Potassium is 3.7   Creatinine is 0.97    On 06/23/2022:   TSH is  8.638 (upper limit of normal is 4.940)   Potassium is 3.5   Creatinine is 0.92   AST and ALT are normal.    Cholesterol is 145   HDL is 34   Triglyceride is 162   LDL is 83    REVIEW AND SUMMARIZATION OF AVAILABLE OLD RECORDS:   On 01/15/2021 2D echocardiogram showed:   Ejection fraction is 60%.    Trace tricuspid regurgitation.     On 12/29/2014 cardiac catheterization showed:  The left main coronary artery has 25% stenosis.    The left anterior descending artery is normal.    The left circumflex artery is moderate-sized.    The 1st obtuse marginal artery has 80% ostial stenosis just proximal to the previous stent.  The ramus intermedius artery is normal.    The right coronary artery has minimal luminal irregularities.  Drug-eluting stent to the obtuse marginal artery using a Promus Premier stent.     01/30/2014 cardiac catheterization showed:   The left main coronary artery has 20-30% ostial stenosis.    The left anterior descending artery has minor luminal irregularities.    The left circumflex artery has no definite disease.    The 1st obtuse marginal artery has 60% proximal stenosis.    The right coronary artery is normal.    Ejection fraction is 60%  IVUS-guided stenting of the 1st obtuse marginal artery using a drug-eluting stent.       On 01/29/2014 2D echocardiogram showed:   Ejection fraction is 45-50%.    Mild left ventricular hypertrophy.    Trivial tricuspid regurgitation.    Trivial pulmonic regurgitation    Assessment/Plan   (R07.89) Chest heaviness  (primary encounter diagnosis)  Plan: TRANSTHORACIC ECHOCARDIOGRAM - ADULT,         MYOCARDIAL PERFUSION COMPLETE, NUC VENTILATION         AND PERFUSION SCAN    (R06.02) Shortness of breath  Plan: TRANSTHORACIC ECHOCARDIOGRAM - ADULT,         MYOCARDIAL PERFUSION COMPLETE, NUC VENTILATION         AND PERFUSION SCAN    (I25.10) Coronary artery disease  Plan: ECG 12 LEAD W/ INTERP - TODAY  (MUSE, IN         CLINIC), TRANSTHORACIC ECHOCARDIOGRAM - ADULT,          MYOCARDIAL PERFUSION COMPLETE    (Z95.5) History of heart artery stent  Plan: ECG 12 LEAD W/ INTERP - TODAY  (MUSE, IN         CLINIC)     Plan  1. She will continue aspirin and Lipitor for cardiovascular risk reduction.  2. A Lexiscan nuclear stress test will be performed to evaluate for myocardial ischemia.    3. A 2D echocardiogram will be performed to assess the cardiac structure and systolic function.    4. A ventilation perfusion V/Q scan will be performed to rule out pulmonary embolus.    Patient Instructions   1. She will continue aspirin and Lipitor for cardiovascular risk reduction.  2. A Lexiscan nuclear stress test will be performed to evaluate for myocardial ischemia.    3. . A 2D echocardiogram will be performed to assess the cardiac structure and systolic function.    4. A ventilation perfusion V/Q scan will be performed to rule out pulmonary  embolus.  5. No caffeine products and no decaffeinated products for 2 days before the stress test.      Return for FOLLOW-UP AFTER TESTS ARE COMPLETED.    Latricia Heft, MD

## 2023-07-21 NOTE — Patient Instructions (Signed)
 1. She will continue aspirin and Lipitor for cardiovascular risk reduction.  2. A Lexiscan nuclear stress test will be performed to evaluate for myocardial ischemia.    3. . A 2D echocardiogram will be performed to assess the cardiac structure and systolic function.    4. A ventilation perfusion V/Q scan will be performed to rule out pulmonary embolus.  5. No caffeine products and no decaffeinated products for 2 days before the stress test.

## 2023-07-30 ENCOUNTER — Other Ambulatory Visit (RURAL_HEALTH_CENTER): Payer: Self-pay | Admitting: Family Medicine

## 2023-07-30 ENCOUNTER — Other Ambulatory Visit (RURAL_HEALTH_CENTER): Payer: Self-pay | Admitting: Nurse Practitioner

## 2023-07-30 DIAGNOSIS — E039 Hypothyroidism, unspecified: Secondary | ICD-10-CM

## 2023-07-30 DIAGNOSIS — F411 Generalized anxiety disorder: Secondary | ICD-10-CM

## 2023-07-30 DIAGNOSIS — I509 Heart failure, unspecified: Secondary | ICD-10-CM

## 2023-07-31 ENCOUNTER — Ambulatory Visit (INDEPENDENT_AMBULATORY_CARE_PROVIDER_SITE_OTHER)
Admission: RE | Admit: 2023-07-31 | Discharge: 2023-07-31 | Disposition: A | Discharge status: Home / Self Care | Source: Ambulatory Visit | Attending: INTERVENTIONAL CARDIOLOGY | Admitting: INTERVENTIONAL CARDIOLOGY

## 2023-07-31 ENCOUNTER — Other Ambulatory Visit: Payer: Self-pay

## 2023-07-31 ENCOUNTER — Other Ambulatory Visit (HOSPITAL_COMMUNITY): Payer: Self-pay | Admitting: INTERVENTIONAL CARDIOLOGY

## 2023-07-31 ENCOUNTER — Ambulatory Visit
Admission: RE | Admit: 2023-07-31 | Discharge: 2023-07-31 | Disposition: A | Discharge status: Home / Self Care | Payer: Self-pay | Source: Ambulatory Visit | Attending: INTERVENTIONAL CARDIOLOGY | Admitting: INTERVENTIONAL CARDIOLOGY

## 2023-07-31 DIAGNOSIS — R0789 Other chest pain: Secondary | ICD-10-CM | POA: Insufficient documentation

## 2023-07-31 DIAGNOSIS — R0602 Shortness of breath: Secondary | ICD-10-CM

## 2023-08-03 ENCOUNTER — Ambulatory Visit (HOSPITAL_COMMUNITY): Payer: Self-pay

## 2023-08-07 ENCOUNTER — Ambulatory Visit (INDEPENDENT_AMBULATORY_CARE_PROVIDER_SITE_OTHER): Payer: Self-pay

## 2023-08-09 ENCOUNTER — Ambulatory Visit (HOSPITAL_COMMUNITY): Payer: Self-pay

## 2023-08-20 ENCOUNTER — Other Ambulatory Visit (RURAL_HEALTH_CENTER): Payer: Self-pay | Admitting: Family Medicine

## 2023-08-20 DIAGNOSIS — M549 Dorsalgia, unspecified: Secondary | ICD-10-CM

## 2023-08-20 DIAGNOSIS — I509 Heart failure, unspecified: Secondary | ICD-10-CM

## 2023-08-20 DIAGNOSIS — I10 Essential (primary) hypertension: Secondary | ICD-10-CM

## 2023-08-20 DIAGNOSIS — E782 Mixed hyperlipidemia: Secondary | ICD-10-CM

## 2023-08-20 DIAGNOSIS — K219 Gastro-esophageal reflux disease without esophagitis: Secondary | ICD-10-CM

## 2023-08-24 ENCOUNTER — Other Ambulatory Visit (RURAL_HEALTH_CENTER): Payer: Self-pay | Admitting: Family Medicine

## 2023-08-24 DIAGNOSIS — F411 Generalized anxiety disorder: Secondary | ICD-10-CM

## 2023-08-25 ENCOUNTER — Ambulatory Visit (HOSPITAL_COMMUNITY): Payer: Self-pay

## 2023-08-25 ENCOUNTER — Ambulatory Visit (INDEPENDENT_AMBULATORY_CARE_PROVIDER_SITE_OTHER): Payer: Self-pay | Admitting: INTERVENTIONAL CARDIOLOGY

## 2023-08-26 ENCOUNTER — Ambulatory Visit (RURAL_HEALTH_CENTER): Payer: Self-pay | Admitting: Family Medicine

## 2023-08-26 DIAGNOSIS — Z0000 Encounter for general adult medical examination without abnormal findings: Secondary | ICD-10-CM

## 2023-08-26 DIAGNOSIS — M159 Polyosteoarthritis, unspecified: Secondary | ICD-10-CM

## 2023-08-26 DIAGNOSIS — K219 Gastro-esophageal reflux disease without esophagitis: Secondary | ICD-10-CM

## 2023-08-26 DIAGNOSIS — I2510 Atherosclerotic heart disease of native coronary artery without angina pectoris: Secondary | ICD-10-CM

## 2023-08-26 DIAGNOSIS — I10 Essential (primary) hypertension: Secondary | ICD-10-CM

## 2023-08-26 DIAGNOSIS — F17210 Nicotine dependence, cigarettes, uncomplicated: Secondary | ICD-10-CM

## 2023-08-26 DIAGNOSIS — E039 Hypothyroidism, unspecified: Secondary | ICD-10-CM

## 2023-08-26 DIAGNOSIS — F411 Generalized anxiety disorder: Secondary | ICD-10-CM

## 2023-08-26 DIAGNOSIS — J449 Chronic obstructive pulmonary disease, unspecified: Secondary | ICD-10-CM

## 2023-08-26 DIAGNOSIS — E782 Mixed hyperlipidemia: Secondary | ICD-10-CM

## 2023-08-28 ENCOUNTER — Other Ambulatory Visit (RURAL_HEALTH_CENTER): Payer: Self-pay | Admitting: Family Medicine

## 2023-08-28 DIAGNOSIS — I509 Heart failure, unspecified: Secondary | ICD-10-CM

## 2023-09-01 ENCOUNTER — Ambulatory Visit (RURAL_HEALTH_CENTER): Payer: Self-pay | Admitting: Family Medicine

## 2023-09-01 DIAGNOSIS — I2510 Atherosclerotic heart disease of native coronary artery without angina pectoris: Secondary | ICD-10-CM

## 2023-09-01 DIAGNOSIS — Z0000 Encounter for general adult medical examination without abnormal findings: Secondary | ICD-10-CM

## 2023-09-01 DIAGNOSIS — Z955 Presence of coronary angioplasty implant and graft: Secondary | ICD-10-CM

## 2023-09-01 DIAGNOSIS — E782 Mixed hyperlipidemia: Secondary | ICD-10-CM

## 2023-09-01 DIAGNOSIS — J449 Chronic obstructive pulmonary disease, unspecified: Secondary | ICD-10-CM

## 2023-09-01 DIAGNOSIS — R609 Edema, unspecified: Secondary | ICD-10-CM

## 2023-09-01 DIAGNOSIS — J309 Allergic rhinitis, unspecified: Secondary | ICD-10-CM

## 2023-09-01 DIAGNOSIS — F17210 Nicotine dependence, cigarettes, uncomplicated: Secondary | ICD-10-CM

## 2023-09-01 DIAGNOSIS — M5410 Radiculopathy, site unspecified: Secondary | ICD-10-CM

## 2023-09-01 DIAGNOSIS — F411 Generalized anxiety disorder: Secondary | ICD-10-CM

## 2023-09-01 DIAGNOSIS — I10 Essential (primary) hypertension: Secondary | ICD-10-CM

## 2023-09-01 DIAGNOSIS — K219 Gastro-esophageal reflux disease without esophagitis: Secondary | ICD-10-CM

## 2023-09-01 DIAGNOSIS — G4762 Sleep related leg cramps: Secondary | ICD-10-CM

## 2023-09-16 ENCOUNTER — Ambulatory Visit (RURAL_HEALTH_CENTER): Payer: Self-pay | Admitting: Family Medicine

## 2023-09-16 DIAGNOSIS — F411 Generalized anxiety disorder: Secondary | ICD-10-CM

## 2023-09-16 DIAGNOSIS — E782 Mixed hyperlipidemia: Secondary | ICD-10-CM

## 2023-09-16 DIAGNOSIS — J449 Chronic obstructive pulmonary disease, unspecified: Secondary | ICD-10-CM

## 2023-09-16 DIAGNOSIS — F17210 Nicotine dependence, cigarettes, uncomplicated: Secondary | ICD-10-CM

## 2023-09-16 DIAGNOSIS — R0602 Shortness of breath: Secondary | ICD-10-CM

## 2023-09-16 DIAGNOSIS — E039 Hypothyroidism, unspecified: Secondary | ICD-10-CM

## 2023-09-16 DIAGNOSIS — G4762 Sleep related leg cramps: Secondary | ICD-10-CM

## 2023-09-16 DIAGNOSIS — M159 Polyosteoarthritis, unspecified: Secondary | ICD-10-CM

## 2023-09-16 DIAGNOSIS — S0990XD Unspecified injury of head, subsequent encounter: Secondary | ICD-10-CM

## 2023-09-16 DIAGNOSIS — Z0000 Encounter for general adult medical examination without abnormal findings: Secondary | ICD-10-CM

## 2023-09-16 DIAGNOSIS — I10 Essential (primary) hypertension: Secondary | ICD-10-CM

## 2023-09-16 DIAGNOSIS — K219 Gastro-esophageal reflux disease without esophagitis: Secondary | ICD-10-CM

## 2023-09-18 ENCOUNTER — Ambulatory Visit (RURAL_HEALTH_CENTER): Payer: Self-pay | Admitting: Family Medicine

## 2023-09-18 DIAGNOSIS — I2510 Atherosclerotic heart disease of native coronary artery without angina pectoris: Secondary | ICD-10-CM

## 2023-09-18 DIAGNOSIS — R609 Edema, unspecified: Secondary | ICD-10-CM

## 2023-09-18 DIAGNOSIS — K219 Gastro-esophageal reflux disease without esophagitis: Secondary | ICD-10-CM

## 2023-09-18 DIAGNOSIS — E782 Mixed hyperlipidemia: Secondary | ICD-10-CM

## 2023-09-18 DIAGNOSIS — Z0000 Encounter for general adult medical examination without abnormal findings: Secondary | ICD-10-CM

## 2023-09-18 DIAGNOSIS — I10 Essential (primary) hypertension: Secondary | ICD-10-CM

## 2023-09-18 DIAGNOSIS — F411 Generalized anxiety disorder: Secondary | ICD-10-CM

## 2023-09-18 DIAGNOSIS — J309 Allergic rhinitis, unspecified: Secondary | ICD-10-CM

## 2023-09-18 DIAGNOSIS — J449 Chronic obstructive pulmonary disease, unspecified: Secondary | ICD-10-CM

## 2023-09-18 DIAGNOSIS — E039 Hypothyroidism, unspecified: Secondary | ICD-10-CM

## 2023-09-21 ENCOUNTER — Other Ambulatory Visit (RURAL_HEALTH_CENTER): Payer: Self-pay | Admitting: Family Medicine

## 2023-09-21 ENCOUNTER — Other Ambulatory Visit: Payer: Self-pay

## 2023-09-21 ENCOUNTER — Encounter (RURAL_HEALTH_CENTER): Payer: Self-pay | Admitting: Family Medicine

## 2023-09-21 ENCOUNTER — Ambulatory Visit: Payer: Self-pay | Attending: Family Medicine | Admitting: Family Medicine

## 2023-09-21 VITALS — BP 112/78 | HR 70 | Temp 98.0°F | Resp 17 | Ht 67.01 in | Wt 193.8 lb

## 2023-09-21 DIAGNOSIS — R918 Other nonspecific abnormal finding of lung field: Secondary | ICD-10-CM | POA: Insufficient documentation

## 2023-09-21 DIAGNOSIS — J449 Chronic obstructive pulmonary disease, unspecified: Secondary | ICD-10-CM | POA: Insufficient documentation

## 2023-09-21 DIAGNOSIS — K219 Gastro-esophageal reflux disease without esophagitis: Secondary | ICD-10-CM | POA: Insufficient documentation

## 2023-09-21 DIAGNOSIS — T7840XA Allergy, unspecified, initial encounter: Secondary | ICD-10-CM | POA: Insufficient documentation

## 2023-09-21 DIAGNOSIS — F17210 Nicotine dependence, cigarettes, uncomplicated: Secondary | ICD-10-CM | POA: Insufficient documentation

## 2023-09-21 DIAGNOSIS — Z79899 Other long term (current) drug therapy: Secondary | ICD-10-CM | POA: Insufficient documentation

## 2023-09-21 DIAGNOSIS — F411 Generalized anxiety disorder: Secondary | ICD-10-CM

## 2023-09-21 DIAGNOSIS — Z955 Presence of coronary angioplasty implant and graft: Secondary | ICD-10-CM | POA: Insufficient documentation

## 2023-09-21 DIAGNOSIS — Z79890 Hormone replacement therapy (postmenopausal): Secondary | ICD-10-CM | POA: Insufficient documentation

## 2023-09-21 DIAGNOSIS — R609 Edema, unspecified: Secondary | ICD-10-CM | POA: Insufficient documentation

## 2023-09-21 DIAGNOSIS — Z0000 Encounter for general adult medical examination without abnormal findings: Secondary | ICD-10-CM | POA: Insufficient documentation

## 2023-09-21 DIAGNOSIS — R200 Anesthesia of skin: Secondary | ICD-10-CM | POA: Insufficient documentation

## 2023-09-21 DIAGNOSIS — E782 Mixed hyperlipidemia: Secondary | ICD-10-CM | POA: Insufficient documentation

## 2023-09-21 DIAGNOSIS — I10 Essential (primary) hypertension: Secondary | ICD-10-CM | POA: Insufficient documentation

## 2023-09-21 DIAGNOSIS — Z7982 Long term (current) use of aspirin: Secondary | ICD-10-CM | POA: Insufficient documentation

## 2023-09-21 DIAGNOSIS — E039 Hypothyroidism, unspecified: Secondary | ICD-10-CM | POA: Insufficient documentation

## 2023-09-21 DIAGNOSIS — F32A Depression, unspecified: Secondary | ICD-10-CM | POA: Insufficient documentation

## 2023-09-21 DIAGNOSIS — Z634 Disappearance and death of family member: Secondary | ICD-10-CM | POA: Insufficient documentation

## 2023-09-21 DIAGNOSIS — Z0001 Encounter for general adult medical examination with abnormal findings: Secondary | ICD-10-CM | POA: Insufficient documentation

## 2023-09-21 DIAGNOSIS — M549 Dorsalgia, unspecified: Secondary | ICD-10-CM | POA: Insufficient documentation

## 2023-09-21 DIAGNOSIS — R202 Paresthesia of skin: Secondary | ICD-10-CM | POA: Insufficient documentation

## 2023-09-21 DIAGNOSIS — M159 Polyosteoarthritis, unspecified: Secondary | ICD-10-CM | POA: Insufficient documentation

## 2023-09-21 DIAGNOSIS — I2510 Atherosclerotic heart disease of native coronary artery without angina pectoris: Secondary | ICD-10-CM | POA: Insufficient documentation

## 2023-09-21 NOTE — Patient Instructions (Signed)
 Medicare Preventive Services  Medicare coverage information Recommendation for YOU   Heart Disease and Diabetes   Lipid profile Every 5 years or more often if at risk for cardiovascular disease     Lab Results   Component Value Date    CHOLESTEROL 145 06/23/2022    HDLCHOL 34 (L) 06/23/2022    LDLCHOL 83 06/23/2022    TRIG 162 (H) 06/23/2022         Diabetes Screening    Yearly for those at risk for diabetes, 2 tests per year for those with prediabetes Last Glucose:      Diabetes Self Management Training or Medical Nutrition Therapy  For those with diabetes, up to 10 hrs initial training within a year, subsequent years up to 2 hrs of follow up training Optional for those with diabetes     Medical Nutrition Therapy  Three hours of one-on-one counseling in first year, two hours in subsequent years Optional for those with diabetes, kidney disease   Intensive Behavioral Therapy for Obesity  Face-to-face counseling, first month every week, month 2-6 every other week, month 7-12 every month if continued progress is documented Optional for those with Body Mass Index 30 or higher  Your Body mass index is 30.35 kg/m.   Tobacco Cessation (Quitting) Counseling   Covers up to 8 smoking and tobacco-use cessation counseling sessions in a 27-month period.    Optional for those that use tobacco   Cancer Screening Last Completion Date   Colorectal screening   For anyone age 41 to 28 or any age if high risk:  Screening Colonoscopy every 10 yrs if low risk,  more frequent if higher risk  OR  Cologuard Stool DNA test once every 3 years OR  Fecal Occult Blood Testing yearly OR  Flexible  Sigmoidoscopy  every 5 yr OR  CT Colonography every 5 yrs      See below for due date if applicable.   Screening Pap Test   Recommended every 3 years for all women age 37 to 70, or every five years if combined with HPV test (routine screening not needed after total hysterectomy).  Medicare covers every 2 years or yearly if high risk.  Screening Pelvic  Exam   Medicare covers every 2 years, yearly if high risk or childbearing age with abnormal Pap in last 3 yrs.     See below for due date if applicable.   Screening Mammogram   Recommended every 2 years for women age 86 to 48, or more frequent if you have a higher risk. Selectively recommended for women between 40-49 based on shared decisions about risk. Covered by Medicare up to every year for women age 55 or older --01/16/2022  See below for due date if applicable.         Lung Cancer Screening  Annual low dose computed tomography (LDCT scan) is recommended for those age 101-80 who smoked 20 pack-years and are current smokers or quit smoking within past 15 years, after counseling by your doctor or nurse clinician about the possible benefits or harms.   --03/06/2023  See below for due date if applicable.   Vaccinations   Respiratory syncytial virus (RSV)  Age 35 years or older: Based on shared clinical decision-making with your provider.  Pneumococcal Vaccine  Recommended routinely age 73+ with one or two separate vaccines based on your risk. Recommended before age 39 if medical conditions with increased risk  Seasonal Influenza Vaccine  Once every flu season  Hepatitis B Vaccine  3 doses if risk (including anyone with diabetes or liver disease)  Shingles Vaccine  Two doses at age 40 or older  Diphtheria Tetanus Pertussis Vaccine  ONCE as adult, booster every 10 years     Immunization History   Administered Date(s) Administered   . HEPATITIS A VACCINE -ADULT 08/05/2017   . Influenza Vaccine, 65+ 02/03/2022     Shingles vaccine and Diphtheria Tetanus Pertussis vaccines are available at pharmacies or local health department without a prescription.   Other Preventative Screening  Last Completion Date   Bone Densitometry   Screening: All females ages 80 and older every 10 years if initial screening normal. Postmenopausal women ages 75-64 need screening with one or more risk factor: previous fracture, parental hip  fracture, current smoker, low body weight, excessive alcohol use, Rheumatoid Arthritis   For women with diagnosed Osteoporosis, follow up is recommended every 2 years or a frequency recommended by your provider.       See below for due date if applicable.     Glaucoma Screening   Yearly if in high risk group such as diabetes, family history, African American age 77+ or Hispanic American age 78+   See your eye care provider for screening.   Hepatitis C Screening   Recommended  for those born between ages 18-79 years.     See below for due date if applicable.     HIV Testing  Recommended routinely at least ONCE, covered every year for age 2 to 72 regardless of risk, and every year for age over 28 who ask for the test or higher risk. Yearly or up to 3 times in pregnancy         See below for due date if applicable.   Abdominal Aortic Aneurysm Screening Ultrasound   Once with a family history of abdominal aortic aneurysms OR a female between65-75 and have smoked at least 100 cigarettes in your lifetime.         See below for due date if applicable.       Your Personalized Schedule for Preventive Tests   Health Maintenance: Pending and Last Completed       Date Due Completion Date    Osteoporosis screening Never done ---    Hepatitis C screening Never done ---    Adult Tdap-Td (1 - Tdap) Never done ---    Pneumococcal Vaccination, Age 30+ (1 of 2 - PCV) Never done ---    Colonoscopy Never done ---    Shingles Vaccine (1 of 2) Never done ---    RSV Adult 60+ or Pregnancy (1 - Risk 60-74 years 1-dose series) Never done ---    Covid-19 Vaccine (1 - 2024-25 season) Never done ---    Medicare Annual Wellness Visit - Calendar Year Insurers 04/15/2023 06/23/2022    Influenza Vaccine (Season Ended) 12/14/2023 02/03/2022    Breast Cancer Screening 01/17/2024 01/16/2022    CT Lung Cancer Screening 03/05/2024 03/06/2023    Depression Screening 09/20/2024 09/21/2023                For Information on Advanced Directives for Health  Care:  [place]:  [email or web address]  [place], [place], [place], [place] General Information: [email or web address]

## 2023-09-21 NOTE — Progress Notes (Unsigned)
 FAMILY MEDICINE, AMBULATORY [facility]  [address]  [place] [address]  Operated by [facility]  Medicare Annual Wellness Visit    Name: [name] [name] MRN:  [id number]   Date: 09/21/2023 Age: 68 y.o.       SUBJECTIVE:   [name] [name] is a 68 y.o. female for presenting for Medicare Wellness exam.   I have reviewed and reconciled the medication list with the patient today.    Comprehensive Health Assessment:    Paper document COMPREHENSIVE HEALTH ASSESSMENT reviewed and scanned into medical record    I have reviewed and updated as appropriate the past medical, family and social history. 09/21/2023 as summarized below:  Past Medical History:   Diagnosis Date    CAD (coronary artery disease) 05/05/2018    CHF (congestive heart failure) 05/05/2018    COPD (chronic obstructive pulmonary disease) 05/05/2018    GAD (generalized anxiety disorder) 05/05/2018    GERD (gastroesophageal reflux disease) 05/05/2018    History of heart artery stent 05/05/2018    Hyperlipidemia 05/05/2018    Hypertension 05/05/2018    Hypothyroidism 05/05/2018    Nicotine dependence, cigarettes, uncomplicated 05/05/2018    Right shoulder pain 05/05/2018     Past Surgical History:   Procedure Laterality Date    Coronary artery angioplasty      Hx appendectomy      Hx cesarean section      Hx heart catheterization      Leg surgery Right      Current Outpatient Medications   Medication Sig    albuterol  sulfate (PROVENTIL  OR VENTOLIN  OR PROAIR ) 90 mcg/actuation Inhalation oral inhaler Take 2 Puffs by inhalation Every 4 hours as needed    aspirin (ECOTRIN) 81 mg Oral Tablet, Delayed Release (E.C.) Take 1 Tablet (81 mg total) by mouth Daily    atorvastatin  (LIPITOR) 80 mg Oral Tablet Take 1 tablet by mouth once daily    carvediloL  (COREG ) 6.25 mg Oral Tablet Take 1 tablet by mouth twice daily with food    clonazePAM  (KLONOPIN ) 1 mg Oral Tablet TAKE 1 TABLET BY MOUTH THREE TIMES DAILY     furosemide  (LASIX ) 20 mg Oral Tablet Take 1 tablet by mouth twice daily as needed    Ibuprofen  (MOTRIN ) 600 mg Oral Tablet Take 1 Tablet (600 mg total) by mouth Three times a day as needed for Pain    levothyroxine  (SYNTHROID) 125 mcg Oral Tablet TAKE 1 TABLET BY MOUTH ONCE DAILY IN THE MORNING    lisinopriL  (PRINIVIL ) 10 mg Oral Tablet Take 1 tablet by mouth once daily    loratadine  (CLARITIN ) 10 mg Oral Tablet Take 1 Tablet (10 mg total) by mouth Once a day    miconazole  nitrate (MONISTAT ) 2 % Vaginal Cream Insert 1 Applicator into the vagina Every night    nitroGLYCERIN  (NITROSTAT ) 0.4 mg Sublingual Tablet, Sublingual DISSOLVE ONE TABLET UNDER THE TONGUE EVERY 5 MINUTES AS NEEDED FOR CHEST PAIN.  DO NOT EXCEED A TOTAL OF 3 DOSES IN 15 MINUTES    pantoprazole  (PROTONIX ) 40 mg Oral Tablet, Delayed Release (E.C.) Take 1 tablet by mouth once daily    prednisoLONE acetate (PRED FORTE) 1 % Ophthalmic Drops, Suspension Instill 1 Drop into both eyes Four times a day    tiZANidine  (ZANAFLEX ) 4 mg Oral Tablet TAKE 1 TABLET BY MOUTH THREE TIMES DAILY     Family Medical History:       Problem Relation (Age of Onset)  Breast Cancer Sister    Cancer Other    Diabetes Other    Lymphoma Other            Social History     Socioeconomic History    Marital status: Widowed   Occupational History    Occupation: Retired   Tobacco Use    Smoking status: Every Day     Current packs/day: 0.75     Average packs/day: 0.8 packs/day for 53.4 years (40.1 ttl pk-yrs)     Types: Cigarettes     Start date: 1972     Passive exposure: Current    Smokeless tobacco: Never   Vaping Use    Vaping status: Never Used   Substance and Sexual Activity    Alcohol use: Yes     Comment: occasional    Drug use: Never    Sexual activity: Yes     Partners: Male     Social Determinants of Health     Financial Resource Strain: Low Risk  (04/16/2023)    Financial Resource Strain     SDOH Financial: No   Transportation Needs: High Risk (04/16/2023)    Transportation  Needs     SDOH Transportation: Yes, it has kept me from medical appointments or from getting my medications   Social Connections: Low Risk  (04/16/2023)    Social Connections     SDOH Social Isolation: 5 or more times a week   Intimate Partner Violence: Low Risk  (04/16/2023)    Intimate Partner Violence     SDOH Domestic Violence: No   Housing Stability: Low Risk  (04/16/2023)    Housing Stability     SDOH Housing Situation: I have housing.     SDOH Housing Worry: No   Health Literacy: Low Risk  (03/16/2023)    Health Literacy     SDOH Health Literacy: Never   Employment Status: Low Risk  (04/16/2023)    Employment Status     SDOH Employment: Full-time work         List of Current Health Care Providers   Care Team       PCP       Name Type Specialty Phone Number    Darilyn Edin, MD Physician [facility] [phone]              Care Team       No care team found                      Health Maintenance   Topic Date Due    Osteoporosis screening  Never done    Hepatitis C screening  Never done    Adult Tdap-Td (1 - Tdap) Never done    Pneumococcal Vaccination, Age 32+ (1 of 2 - PCV) Never done    Colonoscopy  Never done    Shingles Vaccine (1 of 2) Never done    RSV Adult 60+ or Pregnancy (1 - Risk 60-74 years 1-dose series) Never done    Covid-19 Vaccine (1 - 2024-25 season) Never done    [facility] Annual Wellness Visit - Calendar Year Insurers  04/15/2023    Influenza Vaccine (Season Ended) 12/14/2023    Breast Cancer Screening  01/17/2024    CT Lung Cancer Screening  03/05/2024    Depression Screening  09/20/2024     Medicare Wellness Assessment   Medicare initial or wellness physical in the last year?: No  Advance Directives   Does patient  have a living will or MPOA: No           Advance directive information given to the patient today?: Patient Declined      Activities of Daily Living   Do you need help with dressing, bathing, or walking?: No   Do you need help with shopping, housekeeping, medications, or  finances?: No   Do you have rugs in hallways, broken steps, or poor lighting?: No   Do you have grab bars in your bathroom, non-slip strips in your tub, and hand rails on your stairs?: Yes   Cognitive Function Screen (1=Yes, 0=No)   What is you age?: Correct   What is the time to the nearest hour?: Correct   What is the year?: Correct   What is the name of this clinic?: Correct   Can the patient recognize two persons (the doctor, the nurse, home help, etc.)?: Correct   What is the date of your birth? (day and month sufficient) : Correct   In what year did World War II end?: Incorrect   Who is the current president of the [place] ?: Incorrect   Count from 20 down to 1?: Correct   What address did I give you earlier?: Correct   Total Score: 8   Interpretation of Total Score: Greater than 6 Normal   Fall Risk Screen   Do you feel unsteady when standing or walking?: No  Do you worry about falling?: No  Have you fallen in the past year?: No   Depression Screen     Little interest or pleasure in doing things.: Not at all  Feeling down, depressed, or hopeless: Not at all  PHQ 2 Total: 0     Pain Score   Pain Score:   5    Substance Use-Abuse Screening     Tobacco Use     In Past 12 MONTHS, how often have you used any tobacco product (for example, cigarettes, e-cigarettes, cigars, pipes, or smokeless tobacco)?: Daily  In the PAST 3 MONTHS, did you smoke a cigarette containing tobacco or use any other nicotine delivery product (i.e., e-cigarette, vaping or chewing tobacco)?: Yes  In the PAST 3 MONTHS, did you usually smoke more than 10 cigarettes, vape, use an e-cigarette or chew tobacco more than 10 times each day?: Yes  In the PAST 3 MONTHS, did you usually smoke/use an e-cigarette, vape or chew tobacco within 30 minutes after waking?: Yes     Alcohol use     In the PAST 12 MONTHS, how often have you had 5 (men)/4 (women) or more drinks containing alcohol in one day?: Never     Prescription Drug Use     In the PAST 12  months, how often have you used any prescription medications just for the feeling, more than prescribed, or that were not prescribed for you? Prescriptions may include: opioids, benzodiazepines, medications for ADHD: Never           Illicit Drug Use   In the PAST 12 MONTHS, how often have you used any drugs, including marijuana, cocaine or crack, heroin, methamphetamine, hallucinogens, ecstasy/MDMA?: Never            Urine Incontinence Screen   Urinary Incontinence Screen  Do you ever leak urine when you don't want to?: No       OBJECTIVE:   BP 112/78   Pulse 70   Temp 36.7 C (98 F) (Temporal)   Resp 17   Ht 1.702 m (  5' 7.01")   Wt 87.9 kg (193 lb 12.8 oz)   SpO2 93%   BMI 30.35 kg/m        Other appropriate exam:    Health Maintenance Due   Topic Date Due    Osteoporosis screening  Never done    Hepatitis C screening  Never done    Adult Tdap-Td (1 - Tdap) Never done    Pneumococcal Vaccination, Age 12+ (1 of 2 - PCV) Never done    Colonoscopy  Never done    Shingles Vaccine (1 of 2) Never done    RSV Adult 60+ or Pregnancy (1 - Risk 60-74 years 1-dose series) Never done    Covid-19 Vaccine (1 - 2024-25 season) Never done    [facility] Annual Wellness Visit - Calendar Year Insurers  04/15/2023      ASSESSMENT & PLAN:     ICD-10-CM    1. Medicare annual wellness visit, subsequent  Z00.00           Identified Risk Factors/ Recommended Actions       The PHQ 2 Total: 0 depression screen is interpreted as negative.        Patient declined Advanced Directives information.        No orders of the defined types were placed in this encounter.       The patient has been educated about risk factors and recommended preventive care. Written Prevention Plan completed/ updated and given to patient (see After Visit Summary).  I personally saw the patient. See nurses note for additional details. My findings/particpation are that I personally  discussed the prevention plan with the patient including ***     No follow-ups on  file.    Darilyn Edin, MD  FAMILY MEDICINE, [facility]  [address]  [place] [address]  Phone: [phone]  Fax: [phone]

## 2023-09-22 ENCOUNTER — Encounter (RURAL_HEALTH_CENTER): Payer: Self-pay | Admitting: Family Medicine

## 2023-09-22 MED ORDER — CLONAZEPAM 1 MG TABLET
1.0000 mg | ORAL_TABLET | Freq: Three times a day (TID) | ORAL | 0 refills | Status: DC
Start: 2023-09-22 — End: 2023-10-22

## 2023-09-23 ENCOUNTER — Telehealth (RURAL_HEALTH_CENTER): Payer: Self-pay | Admitting: Family Medicine

## 2023-09-23 NOTE — Telephone Encounter (Signed)
 Patient notified of Chest CT  appointment scheduled for 10/06/23@11am     [facility] Authorization# [id number] valid 09/22/23-03/20/24

## 2023-09-26 ENCOUNTER — Other Ambulatory Visit (RURAL_HEALTH_CENTER): Payer: Self-pay | Admitting: Family Medicine

## 2023-09-26 DIAGNOSIS — I509 Heart failure, unspecified: Secondary | ICD-10-CM

## 2023-09-28 ENCOUNTER — Other Ambulatory Visit (RURAL_HEALTH_CENTER): Payer: Self-pay | Admitting: Family Medicine

## 2023-09-28 DIAGNOSIS — I10 Essential (primary) hypertension: Secondary | ICD-10-CM

## 2023-10-06 ENCOUNTER — Other Ambulatory Visit (RURAL_HEALTH_CENTER): Payer: Self-pay | Admitting: Family Medicine

## 2023-10-06 ENCOUNTER — Ambulatory Visit (RURAL_HEALTH_CENTER): Payer: Self-pay | Admitting: Family Medicine

## 2023-10-06 ENCOUNTER — Encounter (HOSPITAL_COMMUNITY): Payer: Self-pay

## 2023-10-06 ENCOUNTER — Ambulatory Visit
Admission: RE | Admit: 2023-10-06 | Discharge: 2023-10-06 | Disposition: A | Discharge status: Home / Self Care | Payer: Self-pay | Source: Ambulatory Visit | Attending: Family Medicine | Admitting: Family Medicine

## 2023-10-06 ENCOUNTER — Ambulatory Visit (HOSPITAL_COMMUNITY)
Admission: RE | Admit: 2023-10-06 | Discharge: 2023-10-06 | Disposition: A | Discharge status: Home / Self Care | Source: Ambulatory Visit

## 2023-10-06 ENCOUNTER — Other Ambulatory Visit: Payer: Self-pay

## 2023-10-06 DIAGNOSIS — Z1231 Encounter for screening mammogram for malignant neoplasm of breast: Secondary | ICD-10-CM | POA: Insufficient documentation

## 2023-10-06 DIAGNOSIS — R918 Other nonspecific abnormal finding of lung field: Secondary | ICD-10-CM | POA: Insufficient documentation

## 2023-10-09 ENCOUNTER — Ambulatory Visit (HOSPITAL_COMMUNITY): Payer: Self-pay

## 2023-10-22 ENCOUNTER — Other Ambulatory Visit (RURAL_HEALTH_CENTER): Payer: Self-pay | Admitting: Family Medicine

## 2023-10-22 DIAGNOSIS — F411 Generalized anxiety disorder: Secondary | ICD-10-CM

## 2023-10-22 MED ORDER — CLONAZEPAM 1 MG TABLET
1.0000 mg | ORAL_TABLET | Freq: Three times a day (TID) | ORAL | 1 refills | Status: DC
Start: 2023-10-22 — End: 2023-12-21

## 2023-10-24 ENCOUNTER — Other Ambulatory Visit (RURAL_HEALTH_CENTER): Payer: Self-pay | Admitting: Family Medicine

## 2023-10-24 DIAGNOSIS — I509 Heart failure, unspecified: Secondary | ICD-10-CM

## 2023-10-27 ENCOUNTER — Other Ambulatory Visit (RURAL_HEALTH_CENTER): Payer: Self-pay | Admitting: Family Medicine

## 2023-10-27 DIAGNOSIS — E039 Hypothyroidism, unspecified: Secondary | ICD-10-CM

## 2023-11-18 ENCOUNTER — Other Ambulatory Visit (RURAL_HEALTH_CENTER): Payer: Self-pay | Admitting: Family Medicine

## 2023-11-18 DIAGNOSIS — E782 Mixed hyperlipidemia: Secondary | ICD-10-CM

## 2023-11-18 DIAGNOSIS — I10 Essential (primary) hypertension: Secondary | ICD-10-CM

## 2023-11-18 DIAGNOSIS — I509 Heart failure, unspecified: Secondary | ICD-10-CM

## 2023-11-18 DIAGNOSIS — K219 Gastro-esophageal reflux disease without esophagitis: Secondary | ICD-10-CM

## 2023-11-21 ENCOUNTER — Other Ambulatory Visit (RURAL_HEALTH_CENTER): Payer: Self-pay | Admitting: Family Medicine

## 2023-11-21 DIAGNOSIS — I509 Heart failure, unspecified: Secondary | ICD-10-CM

## 2023-11-24 ENCOUNTER — Other Ambulatory Visit: Payer: Self-pay

## 2023-11-24 ENCOUNTER — Ambulatory Visit: Payer: Self-pay | Attending: INTERVENTIONAL CARDIOLOGY | Admitting: INTERVENTIONAL CARDIOLOGY

## 2023-11-24 VITALS — BP 121/79 | HR 77 | Ht 67.0 in | Wt 195.2 lb

## 2023-11-24 DIAGNOSIS — Z955 Presence of coronary angioplasty implant and graft: Secondary | ICD-10-CM | POA: Insufficient documentation

## 2023-11-24 DIAGNOSIS — F17210 Nicotine dependence, cigarettes, uncomplicated: Secondary | ICD-10-CM | POA: Insufficient documentation

## 2023-11-24 DIAGNOSIS — Z5982 Transportation insecurity: Secondary | ICD-10-CM | POA: Insufficient documentation

## 2023-11-24 DIAGNOSIS — Z7982 Long term (current) use of aspirin: Secondary | ICD-10-CM | POA: Insufficient documentation

## 2023-11-24 DIAGNOSIS — R0789 Other chest pain: Secondary | ICD-10-CM | POA: Insufficient documentation

## 2023-11-24 DIAGNOSIS — R0602 Shortness of breath: Secondary | ICD-10-CM | POA: Insufficient documentation

## 2023-11-24 DIAGNOSIS — Z720 Tobacco use: Secondary | ICD-10-CM | POA: Insufficient documentation

## 2023-11-24 DIAGNOSIS — Z6830 Body mass index (BMI) 30.0-30.9, adult: Secondary | ICD-10-CM | POA: Insufficient documentation

## 2023-11-24 DIAGNOSIS — E669 Obesity, unspecified: Secondary | ICD-10-CM | POA: Insufficient documentation

## 2023-11-24 DIAGNOSIS — I2510 Atherosclerotic heart disease of native coronary artery without angina pectoris: Secondary | ICD-10-CM | POA: Insufficient documentation

## 2023-11-24 MED ORDER — RANOLAZINE ER 500 MG TABLET,EXTENDED RELEASE,12 HR
500.0000 mg | ORAL_TABLET | Freq: Two times a day (BID) | ORAL | 12 refills | Status: AC
Start: 2023-11-24 — End: 2023-12-24

## 2023-11-24 NOTE — Progress Notes (Addendum)
 CARDIOLOGY, [facility]BULATORY CARE CENTER  [address]  [place] [address]  Operated by [facility]  Progress Note        Date:  11/24/2023   Name:  [name] [name]   MRN:  [id number]   DOB:  05-Apr-1956       Chief Complaint   Patient presents with    Heart Disease     Coronary artery disease    Shortness of Breath     There are no exam notes on file for this visit.    Subjective: [name] [name]  is a 68 year old lady seen on follow-up for coronary artery disease.    She has had repeated stenting of the obtuse marginal artery.    In clinic today she gives no complaints.    She states that she feels pretty good.      However, she still experiences episodes of shortness of breath and chest heaviness.    During the last office visit of 07/21/2023 she was scheduled for a V/Q scan, a Lexiscan nuclear stress test and an echocardiogram to evaluate these symptoms.  The V/Q scan showed no pulmonary embolism.    The results and recommendations were discussed with the patient today.    However, she has not performed the echocardiogram and the stress test.     Historically:  She was initially seen on consultation at the request of Dr. Ozell Birmingham for evaluation of coronary artery disease.    She has a history of drug-eluting stent to the obtuse marginal artery on 12/25/2014.  She had previous stenting of the same vessel in 2015.  She previously followed up with Dr. Paulene.    She has a history of chronic obstructive pulmonary disease.    Medical History     I have reviewed and updated as appropriate the past medical, family and social history today:  Past Medical History  Past Medical History:   Diagnosis Date    CAD (coronary artery disease) 05/05/2018    CHF (congestive heart failure) 05/05/2018    COPD (chronic obstructive pulmonary disease) 05/05/2018    GAD (generalized anxiety disorder) 05/05/2018    GERD (gastroesophageal reflux disease) 05/05/2018    History of heart artery stent  05/05/2018    Hyperlipidemia 05/05/2018    Hypertension 05/05/2018    Hypothyroidism 05/05/2018    Nicotine dependence, cigarettes, uncomplicated 05/05/2018    Right shoulder pain 05/05/2018     Family History  Family Medical History:       Problem Relation (Age of Onset)    Breast Cancer Sister    Cancer Other    Diabetes Other    Lymphoma Other          Surgical History  Past Surgical History:   Procedure Laterality Date    CORONARY ARTERY ANGIOPLASTY      HX APPENDECTOMY      HX CESAREAN SECTION      HX HEART CATHETERIZATION      LEG SURGERY Right      Social History  Social History     Socioeconomic History    Marital status: Widowed   Occupational History    Occupation: Retired   Tobacco Use    Smoking status: Every Day     Current packs/day: 0.75     Average packs/day: 0.8 packs/day for 53.6 years (40.2 ttl pk-yrs)     Types: Cigarettes     Start date: 1972     Passive exposure: Current  Smokeless tobacco: Never   Vaping Use    Vaping status: Never Used   Substance and Sexual Activity    Alcohol use: Yes     Comment: occasional    Drug use: Never    Sexual activity: Yes     Partners: Male     Social Determinants of Health     Financial Resource Strain: Low Risk  (04/16/2023)    Financial Resource Strain     SDOH Financial: No   Transportation Needs: High Risk (04/16/2023)    Transportation Needs     SDOH Transportation: Yes, it has kept me from medical appointments or from getting my medications   Social Connections: Low Risk  (04/16/2023)    Social Connections     SDOH Social Isolation: 5 or more times a week   Intimate Partner Violence: Low Risk  (04/16/2023)    Intimate Partner Violence     SDOH Domestic Violence: No   Housing Stability: Low Risk  (04/16/2023)    Housing Stability     SDOH Housing Situation: I have housing.     SDOH Housing Worry: No     Medication  Current Outpatient Medications   Medication Sig    albuterol  sulfate (PROVENTIL  OR VENTOLIN  OR PROAIR ) 90 mcg/actuation Inhalation oral inhaler Take 2 Puffs  by inhalation Every 4 hours as needed    aspirin (ECOTRIN) 81 mg Oral Tablet, Delayed Release (E.C.) Take 1 Tablet (81 mg total) by mouth Daily    atorvastatin  (LIPITOR) 80 mg Oral Tablet Take 1 tablet by mouth once daily    carvediloL  (COREG ) 6.25 mg Oral Tablet Take 1 tablet by mouth twice daily with food    clonazePAM  (KLONOPIN ) 1 mg Oral Tablet Take 1 Tablet (1 mg total) by mouth Three times a day    furosemide  (LASIX ) 20 mg Oral Tablet Take 1 tablet by mouth twice daily as needed    Ibuprofen  (MOTRIN ) 600 mg Oral Tablet Take 1 Tablet (600 mg total) by mouth Three times a day as needed for Pain    levothyroxine  (SYNTHROID) 125 mcg Oral Tablet TAKE 1 TABLET BY MOUTH ONCE DAILY IN THE MORNING    lisinopriL  (PRINIVIL ) 10 mg Oral Tablet Take 1 tablet by mouth once daily    loratadine  (CLARITIN ) 10 mg Oral Tablet Take 1 Tablet (10 mg total) by mouth Once a day    miconazole  nitrate (MONISTAT ) 2 % Vaginal Cream Insert 1 Applicator into the vagina Every night (Patient not taking: Reported on 11/24/2023)    nitroGLYCERIN  (NITROSTAT ) 0.4 mg Sublingual Tablet, Sublingual DISSOLVE ONE TABLET UNDER THE TONGUE EVERY 5 MINUTES AS NEEDED FOR CHEST PAIN.  DO NOT EXCEED A TOTAL OF 3 DOSES IN 15 MINUTES    pantoprazole  (PROTONIX ) 40 mg Oral Tablet, Delayed Release (E.C.) Take 1 tablet by mouth once daily    prednisoLONE acetate (PRED FORTE) 1 % Ophthalmic Drops, Suspension Instill 1 Drop into both eyes Four times a day    ranolazine  (RANEXA ) 500 mg Oral Tablet Sustained Release 12 hr Take 1 Tablet (500 mg total) by mouth Twice daily for 30 days    tiZANidine  (ZANAFLEX ) 4 mg Oral Tablet TAKE 1 TABLET BY MOUTH THREE TIMES DAILY (Patient not taking: Reported on 11/24/2023)     Allergies  Allergies[1]      Objective   BP 121/79   Pulse 77   Ht 1.702 m (5' 7)   Wt 88.5 kg (195 lb 3.2 oz)   SpO2 94%   BMI  30.57 kg/m     Physical Exam:  Exam Narrative:  General appearance:  Patient is obese and not in distress.  Cardiovascular  system:  Normal S1 and S2 heart sounds are noted, with a regular rhythm. There is no murmur.    Respiratory system:  The respiratory effort is not labored but, the lungs are clear on auscultation.  Psychiatric system:  The patient is awake and responsive and oriented.  The affect is normal.    Results and Attestation     Labs:  On 03/06/2023:   Potassium is 3.7   Creatinine is 0.97     On 06/23/2022:   TSH is 8.638 (upper limit of normal is 4.940)   Potassium is 3.5   Creatinine is 0.92   AST and ALT are normal.    Cholesterol is 145   HDL is 34   Triglyceride is 162   LDL is 83    REVIEW AND SUMMARIZATION OF AVAILABLE OLD RECORDS:   On 10/06/2023 CAT scan of the chest showed:   Emphysema with stable pulmonary nodules.    On 07/31/2023 ventilation perfusion V/Q scan showed:   No pulmonary embolism.    On 01/15/2021 2D echocardiogram showed:   Ejection fraction is 60%.    Trace tricuspid regurgitation.     On 12/29/2014 cardiac catheterization showed:  The left main coronary artery has 25% stenosis.    The left anterior descending artery is normal.    The left circumflex artery is moderate-sized.    The 1st obtuse marginal artery has 80% ostial stenosis just proximal to the previous stent.  The ramus intermedius artery is normal.    The right coronary artery has minimal luminal irregularities.  Drug-eluting stent to the obtuse marginal artery using a Promus Premier stent.     01/30/2014 cardiac catheterization showed:   The left main coronary artery has 20-30% ostial stenosis.    The left anterior descending artery has minor luminal irregularities.    The left circumflex artery has no definite disease.    The 1st obtuse marginal artery has 60% proximal stenosis.    The right coronary artery is normal.    Ejection fraction is 60%  IVUS-guided stenting of the 1st obtuse marginal artery using a drug-eluting stent.       On 01/29/2014 2D echocardiogram showed:   Ejection fraction is 45-50%.    Mild left ventricular  hypertrophy.    Trivial tricuspid regurgitation.    Trivial pulmonic regurgitation    Assessment/Plan   (I25.10) CAD (coronary artery disease)  (primary encounter diagnosis)  Plan: ALT (SGPT), LIPID PANEL, BASIC METABOLIC PANEL,        FASTING, AST (SGOT), TRANSTHORACIC         ECHOCARDIOGRAM - ADULT, MYOCARDIAL PERFUSION         COMPLETE    (R07.89) Chest heaviness  Plan: TRANSTHORACIC ECHOCARDIOGRAM - ADULT,         MYOCARDIAL PERFUSION COMPLETE    (R06.02) Shortness of breath  Plan: TRANSTHORACIC ECHOCARDIOGRAM - ADULT,         MYOCARDIAL PERFUSION COMPLETE    (Z72.0) Tobacco use  Plan: Referral to External Provider (AMB)    (Z95.5) History of heart artery stent     Plan  1. She will be referred to the [place]  quit line to assist with smoking cessation.    2.  A Lexiscan nuclear stress test will be performed to evaluate for myocardial ischemia.    3. A 2D echocardiogram will  be performed to assess the cardiac structure and systolic function.    4. She will continue aspirin and Lipitor for cardiovascular risk reduction.    5. Blood test will be obtained to assess the basic metabolic panel, lipid profile and transaminase levels.    6. She will be placed on Ranexa  for antianginal effect.    ADDENDUM:   She will require Zetia  or Repatha to reduce the LDL to 55 or less at the next clinic visit.    Patient Instructions   1. She will be referred to the [place]  quit line to assist with smoking cessation.    2.  A Lexiscan nuclear stress test will be performed to evaluate for myocardial ischemia.    3. A 2D echocardiogram will be performed to assess the cardiac structure and systolic function.    4. She will continue aspirin and Lipitor for cardiovascular risk reduction.    5. Blood test will be obtained to assess the basic metabolic panel, lipid profile and transaminase levels.    6. She will be placed on Ranexa  for antianginal effect.  7. No caffeine products and no decaffeinated products for 2 days before  the stress test.    Return for FOLLOW-UP AFTER TESTS ARE COMPLETED.    Salley Boop, MD             [1]   Allergies  Allergen Reactions    Sulfa (Sulfonamides) Hives/ Urticaria

## 2023-11-24 NOTE — Patient Instructions (Addendum)
 1. She will be referred to the [place]  quit line to assist with smoking cessation.    2.  A Lexiscan nuclear stress test will be performed to evaluate for myocardial ischemia.    3. A 2D echocardiogram will be performed to assess the cardiac structure and systolic function.    4. She will continue aspirin and Lipitor for cardiovascular risk reduction.    5. Blood test will be obtained to assess the basic metabolic panel, lipid profile and transaminase levels.    6. She will be placed on Ranexa  for antianginal effect.  7. No caffeine products and no decaffeinated products for 2 days before the stress test.

## 2023-11-25 ENCOUNTER — Telehealth (INDEPENDENT_AMBULATORY_CARE_PROVIDER_SITE_OTHER): Payer: Self-pay | Admitting: INTERVENTIONAL CARDIOLOGY

## 2023-11-25 NOTE — Telephone Encounter (Signed)
 Patient called and stated that  the new medication on last visit is too expensive and was wondering is there something different she can take that would be less expensive. Medication is called ranolazine  500 mg 2x a day

## 2023-11-26 ENCOUNTER — Telehealth (INDEPENDENT_AMBULATORY_CARE_PROVIDER_SITE_OTHER): Payer: Self-pay | Admitting: Physician Assistant

## 2023-11-26 NOTE — Telephone Encounter (Signed)
 Patient wants to know if there is another medication she could try other than ranolazine  it costs too much for her. Dr landrum prescribed it to her last appt. On the 12th.

## 2023-11-26 NOTE — Telephone Encounter (Signed)
 Tes patient would like to try the Norvasc

## 2023-11-26 NOTE — Telephone Encounter (Signed)
 Patient is seen by Dr. MALVA on 11/24/2023.  Known coronary artery disease with cardiac stenting to OM.  At this office visit, patient was started on Ranexa  500 mg twice daily for antianginal effect.  Stress testing and echocardiogram were ordered.  Her blood pressure at that office visit was 121/79.  Current antianginal therapy consists of beta-blockade therapy/Coreg  6.25 mg twice daily.    I do not see where patient has ever tried Norvasc/amlodipine.  This is a calcium channel blocker.  She did not affect heart rate.  May have a slight blood pressure lowering effect, where Ranexa  would not affect blood pressure or heart rate.  If she is open to this, we could attempt to send in amlodipine/Norvasc at lowest dose, 2.5 mg once daily?    Lyle DOROTHA Galt, PA-C  [facility]

## 2023-11-27 MED ORDER — AMLODIPINE 2.5 MG TABLET
2.5000 mg | ORAL_TABLET | Freq: Every day | ORAL | 3 refills | Status: DC
Start: 2023-11-27 — End: 2024-01-27

## 2023-11-27 NOTE — Telephone Encounter (Signed)
 Spoke with [name] [name] key in your absence. Patient called office and stated that she could not afford medication so Brooke started her on Norvas . Just wanted to keep you informed

## 2023-11-27 NOTE — Telephone Encounter (Signed)
Orders Placed This Encounter    amLODIPine (NORVASC) 2.5 mg Oral Tablet     Diara Chaudhari J. Arpi Diebold, PA-C  [facility]

## 2023-11-27 NOTE — Addendum Note (Signed)
 Addended by: Orvil Faraone JANINE on: 11/27/2023 09:11 AM     Modules accepted: Orders

## 2023-12-07 ENCOUNTER — Other Ambulatory Visit: Payer: Self-pay

## 2023-12-07 ENCOUNTER — Ambulatory Visit: Attending: INTERVENTIONAL CARDIOLOGY

## 2023-12-07 DIAGNOSIS — I2510 Atherosclerotic heart disease of native coronary artery without angina pectoris: Secondary | ICD-10-CM | POA: Insufficient documentation

## 2023-12-07 LAB — LIPID PANEL
CHOL/HDL RATIO: 4.5
CHOLESTEROL: 158 mg/dL (ref 100–200)
HDL CHOL: 35 mg/dL — ABNORMAL LOW (ref >=50)
LDL CALC: 95 mg/dL (ref <100)
NON-HDL: 123 mg/dL (ref <=190)
TRIGLYCERIDES: 159 mg/dL — ABNORMAL HIGH (ref <150)
VLDL CALC: 26 mg/dL (ref <30)

## 2023-12-07 LAB — ALT (SGPT): ALT (SGPT): 18 U/L (ref <31)

## 2023-12-07 LAB — AST (SGOT): AST (SGOT): 17 U/L (ref 11–34)

## 2023-12-15 ENCOUNTER — Ambulatory Visit
Admission: RE | Admit: 2023-12-15 | Discharge: 2023-12-15 | Disposition: A | Discharge status: Home / Self Care | Payer: Self-pay | Source: Ambulatory Visit | Attending: INTERVENTIONAL CARDIOLOGY | Admitting: INTERVENTIONAL CARDIOLOGY

## 2023-12-15 ENCOUNTER — Other Ambulatory Visit: Payer: Self-pay

## 2023-12-15 DIAGNOSIS — I2510 Atherosclerotic heart disease of native coronary artery without angina pectoris: Secondary | ICD-10-CM | POA: Insufficient documentation

## 2023-12-15 DIAGNOSIS — R0602 Shortness of breath: Secondary | ICD-10-CM | POA: Insufficient documentation

## 2023-12-15 DIAGNOSIS — R0789 Other chest pain: Secondary | ICD-10-CM | POA: Insufficient documentation

## 2023-12-20 ENCOUNTER — Other Ambulatory Visit (RURAL_HEALTH_CENTER): Payer: Self-pay | Admitting: Family Medicine

## 2023-12-20 DIAGNOSIS — I509 Heart failure, unspecified: Secondary | ICD-10-CM

## 2023-12-21 ENCOUNTER — Other Ambulatory Visit (RURAL_HEALTH_CENTER): Payer: Self-pay | Admitting: Family Medicine

## 2023-12-21 DIAGNOSIS — F411 Generalized anxiety disorder: Secondary | ICD-10-CM

## 2023-12-23 ENCOUNTER — Other Ambulatory Visit (RURAL_HEALTH_CENTER): Payer: Self-pay | Admitting: Family Medicine

## 2023-12-23 DIAGNOSIS — I10 Essential (primary) hypertension: Secondary | ICD-10-CM

## 2023-12-25 ENCOUNTER — Ambulatory Visit: Attending: INTERVENTIONAL CARDIOLOGY

## 2023-12-25 ENCOUNTER — Other Ambulatory Visit: Payer: Self-pay

## 2023-12-25 DIAGNOSIS — I2510 Atherosclerotic heart disease of native coronary artery without angina pectoris: Secondary | ICD-10-CM

## 2023-12-26 ENCOUNTER — Ambulatory Visit

## 2023-12-26 DIAGNOSIS — I2510 Atherosclerotic heart disease of native coronary artery without angina pectoris: Secondary | ICD-10-CM | POA: Insufficient documentation

## 2023-12-26 LAB — BASIC METABOLIC PANEL, FASTING
ANION GAP: 10 mmol/L (ref 4–13)
BUN/CREA RATIO: 24 — ABNORMAL HIGH (ref 6–22)
BUN: 21 mg/dL (ref 8–25)
CALCIUM: 9.3 mg/dL (ref 8.6–10.3)
CHLORIDE: 108 mmol/L (ref 96–111)
CO2 TOTAL: 24 mmol/L (ref 23–31)
CREATININE: 0.86 mg/dL (ref 0.60–1.05)
GLUCOSE: 104 mg/dL — ABNORMAL HIGH (ref 70–99)
POTASSIUM: 3.9 mmol/L (ref 3.5–5.1)
SODIUM: 142 mmol/L (ref 136–145)
eGFRcr - FEMALE: 73 mL/min/1.73mˆ2 (ref >=60)

## 2023-12-29 ENCOUNTER — Ambulatory Visit (INDEPENDENT_AMBULATORY_CARE_PROVIDER_SITE_OTHER): Payer: Self-pay | Admitting: INTERVENTIONAL CARDIOLOGY

## 2024-01-05 ENCOUNTER — Ambulatory Visit (INDEPENDENT_AMBULATORY_CARE_PROVIDER_SITE_OTHER): Payer: Self-pay

## 2024-01-18 ENCOUNTER — Other Ambulatory Visit (RURAL_HEALTH_CENTER): Payer: Self-pay | Admitting: Family Medicine

## 2024-01-18 DIAGNOSIS — F411 Generalized anxiety disorder: Secondary | ICD-10-CM

## 2024-01-18 DIAGNOSIS — I509 Heart failure, unspecified: Secondary | ICD-10-CM

## 2024-01-18 MED ORDER — CLONAZEPAM 1 MG TABLET
1.0000 mg | ORAL_TABLET | Freq: Three times a day (TID) | ORAL | 0 refills | Status: DC
Start: 2024-01-18 — End: 2024-01-27

## 2024-01-19 ENCOUNTER — Ambulatory Visit (INDEPENDENT_AMBULATORY_CARE_PROVIDER_SITE_OTHER): Payer: Self-pay

## 2024-01-21 ENCOUNTER — Ambulatory Visit (RURAL_HEALTH_CENTER): Payer: Self-pay | Admitting: Family Medicine

## 2024-01-21 DIAGNOSIS — J309 Allergic rhinitis, unspecified: Secondary | ICD-10-CM

## 2024-01-21 DIAGNOSIS — R609 Edema, unspecified: Secondary | ICD-10-CM

## 2024-01-21 DIAGNOSIS — K219 Gastro-esophageal reflux disease without esophagitis: Secondary | ICD-10-CM

## 2024-01-21 DIAGNOSIS — G4762 Sleep related leg cramps: Secondary | ICD-10-CM

## 2024-01-21 DIAGNOSIS — I10 Essential (primary) hypertension: Secondary | ICD-10-CM

## 2024-01-21 DIAGNOSIS — E039 Hypothyroidism, unspecified: Secondary | ICD-10-CM

## 2024-01-21 DIAGNOSIS — I2510 Atherosclerotic heart disease of native coronary artery without angina pectoris: Secondary | ICD-10-CM

## 2024-01-21 DIAGNOSIS — E782 Mixed hyperlipidemia: Secondary | ICD-10-CM

## 2024-01-21 DIAGNOSIS — F411 Generalized anxiety disorder: Secondary | ICD-10-CM

## 2024-01-22 ENCOUNTER — Other Ambulatory Visit (RURAL_HEALTH_CENTER): Payer: Self-pay | Admitting: Family Medicine

## 2024-01-22 DIAGNOSIS — E039 Hypothyroidism, unspecified: Secondary | ICD-10-CM

## 2024-01-25 ENCOUNTER — Ambulatory Visit (RURAL_HEALTH_CENTER): Admitting: Family Medicine

## 2024-01-25 DIAGNOSIS — E039 Hypothyroidism, unspecified: Secondary | ICD-10-CM

## 2024-01-25 DIAGNOSIS — F17210 Nicotine dependence, cigarettes, uncomplicated: Secondary | ICD-10-CM

## 2024-01-25 DIAGNOSIS — K219 Gastro-esophageal reflux disease without esophagitis: Secondary | ICD-10-CM

## 2024-01-25 DIAGNOSIS — F411 Generalized anxiety disorder: Secondary | ICD-10-CM

## 2024-01-25 DIAGNOSIS — E782 Mixed hyperlipidemia: Secondary | ICD-10-CM

## 2024-01-25 DIAGNOSIS — J449 Chronic obstructive pulmonary disease, unspecified: Secondary | ICD-10-CM

## 2024-01-25 DIAGNOSIS — I2510 Atherosclerotic heart disease of native coronary artery without angina pectoris: Secondary | ICD-10-CM

## 2024-01-25 DIAGNOSIS — J309 Allergic rhinitis, unspecified: Secondary | ICD-10-CM

## 2024-01-25 DIAGNOSIS — R609 Edema, unspecified: Secondary | ICD-10-CM

## 2024-01-25 DIAGNOSIS — I10 Essential (primary) hypertension: Secondary | ICD-10-CM

## 2024-01-27 ENCOUNTER — Ambulatory Visit: Attending: Family Medicine | Admitting: Family Medicine

## 2024-01-27 ENCOUNTER — Ambulatory Visit (INDEPENDENT_AMBULATORY_CARE_PROVIDER_SITE_OTHER): Payer: Self-pay

## 2024-01-27 ENCOUNTER — Encounter (RURAL_HEALTH_CENTER): Payer: Self-pay | Admitting: Family Medicine

## 2024-01-27 ENCOUNTER — Other Ambulatory Visit: Payer: Self-pay

## 2024-01-27 VITALS — BP 120/72 | HR 69 | Temp 97.5°F | Resp 18 | Ht 67.0 in | Wt 195.0 lb

## 2024-01-27 DIAGNOSIS — I10 Essential (primary) hypertension: Secondary | ICD-10-CM | POA: Insufficient documentation

## 2024-01-27 DIAGNOSIS — F411 Generalized anxiety disorder: Secondary | ICD-10-CM | POA: Insufficient documentation

## 2024-01-27 DIAGNOSIS — Z955 Presence of coronary angioplasty implant and graft: Secondary | ICD-10-CM | POA: Insufficient documentation

## 2024-01-27 DIAGNOSIS — J449 Chronic obstructive pulmonary disease, unspecified: Secondary | ICD-10-CM | POA: Insufficient documentation

## 2024-01-27 DIAGNOSIS — I2510 Atherosclerotic heart disease of native coronary artery without angina pectoris: Secondary | ICD-10-CM | POA: Insufficient documentation

## 2024-01-27 DIAGNOSIS — E782 Mixed hyperlipidemia: Secondary | ICD-10-CM | POA: Insufficient documentation

## 2024-01-27 DIAGNOSIS — R609 Edema, unspecified: Secondary | ICD-10-CM | POA: Insufficient documentation

## 2024-01-27 DIAGNOSIS — K219 Gastro-esophageal reflux disease without esophagitis: Secondary | ICD-10-CM | POA: Insufficient documentation

## 2024-01-27 DIAGNOSIS — F17210 Nicotine dependence, cigarettes, uncomplicated: Secondary | ICD-10-CM | POA: Insufficient documentation

## 2024-01-27 DIAGNOSIS — E039 Hypothyroidism, unspecified: Secondary | ICD-10-CM | POA: Insufficient documentation

## 2024-01-27 DIAGNOSIS — R5383 Other fatigue: Secondary | ICD-10-CM | POA: Insufficient documentation

## 2024-01-27 MED ORDER — PANTOPRAZOLE 40 MG TABLET,DELAYED RELEASE
40.0000 mg | DELAYED_RELEASE_TABLET | Freq: Every day | ORAL | 3 refills | Status: AC
Start: 2024-01-27 — End: ?

## 2024-01-27 MED ORDER — CLONAZEPAM 1 MG TABLET
1.0000 mg | ORAL_TABLET | Freq: Three times a day (TID) | ORAL | 3 refills | Status: AC
Start: 2024-01-27 — End: ?

## 2024-01-27 MED ORDER — LISINOPRIL 10 MG TABLET
10.0000 mg | ORAL_TABLET | Freq: Every day | ORAL | 3 refills | Status: AC
Start: 2024-01-27 — End: ?

## 2024-01-27 NOTE — Progress Notes (Signed)
 [place] [facility]  FAMILY MEDICINE, [facility]  [address]  [place] [address]  [phone]   Progress Note    Date of Service:  01/27/2024  [name], [name], 68 y.o. female  Date of Birth:  Feb 01, 1956  PCP: Ozell FORBES Birmingham, MD    Chief Complaint:  Follow Up 4 Months       HPI:  [name] [name] is a 68 y.o. White female who is seen in the [facility] patient is here for routine 4 month follow-up.  The patient admits several bleeding smooth but is here today to get seen.  Patient this point is doing fairly well.  The patient states that she has not had any significant problems with headaches dizziness or vertigo.  Patient is having no significant nausea vomiting no diarrhea no melena or hematochezia.  Patient states that she does see Cardiology regularly they put her on some new medicines sounds like amlodipine  and she was not able to tolerate it.  Patient had blood work done to look at her renal function and that is doing well.  She has not had her thyroid  checked her last TSH was mildly elevated.  She continues take her thyroid  regularly.  She has not had any significant change with allergies she gets occasional sinus congestion and takes loratadine  if needed.  Her stomach is doing okay she has not had any significant reflux.  She has not had any melena hematochezia she has some constipation has taken occasional laxative every couple of weeks.  She has not had any bleeding.  Patient has not had any significant nausea vomiting no diarrhea no melena or hematochezia.  Patient has had no TIA or CVA symptoms.  She still has problem with the pain in her hands she was told it was arthritis her EMG did not show any carpal tunnel or any radicular symptoms.  She says it just hurts at times.  Patient is not currently taking any anti-inflammatories other than her ibuprofen  occasionally.  She takes her Lasix  occasionally he has not had any  significant adverse effects with it does not have any significant swelling currently.    Medications reconciled and current.     Past Medical History:   Diagnosis Date    CAD (coronary artery disease) 05/05/2018    CHF (congestive heart failure) 05/05/2018    COPD (chronic obstructive pulmonary disease) 05/05/2018    GAD (generalized anxiety disorder) 05/05/2018    GERD (gastroesophageal reflux disease) 05/05/2018    History of heart artery stent 05/05/2018    Hyperlipidemia 05/05/2018    Hypertension 05/05/2018    Hypothyroidism 05/05/2018    Nicotine dependence, cigarettes, uncomplicated 05/05/2018    Right shoulder pain 05/05/2018      Past Surgical History:   Procedure Laterality Date    CORONARY ARTERY ANGIOPLASTY      HX APPENDECTOMY      HX CESAREAN SECTION      HX HEART CATHETERIZATION      LEG SURGERY Right       Social History[1]    Family Medical History:       Problem Relation (Age of Onset)    Breast Cancer Sister    Cancer Other    Diabetes Other    Lymphoma Other           Outpatient Medications Marked as Taking for the 01/27/24 encounter (Office Visit) with Lynnet Hefley E, MD   Medication Sig  albuterol  sulfate (PROVENTIL  OR VENTOLIN  OR PROAIR ) 90 mcg/actuation Inhalation oral inhaler Take 2 Puffs by inhalation Every 4 hours as needed    aspirin (ECOTRIN) 81 mg Oral Tablet, Delayed Release (E.C.) Take 1 Tablet (81 mg total) by mouth Daily    atorvastatin  (LIPITOR) 80 mg Oral Tablet Take 1 tablet by mouth once daily    carvediloL  (COREG ) 6.25 mg Oral Tablet Take 1 tablet by mouth twice daily with food    clonazePAM  (KLONOPIN ) 1 mg Oral Tablet Take 1 Tablet (1 mg total) by mouth Three times a day    furosemide  (LASIX ) 20 mg Oral Tablet Take 1 tablet by mouth twice daily as needed    Ibuprofen  (MOTRIN ) 600 mg Oral Tablet Take 1 Tablet (600 mg total) by mouth Three times a day as needed for Pain    levothyroxine  (SYNTHROID) 125 mcg Oral Tablet TAKE 1 TABLET BY MOUTH ONCE DAILY IN THE MORNING    lisinopriL   (PRINIVIL ) 10 mg Oral Tablet Take 1 Tablet (10 mg total) by mouth Daily    loratadine  (CLARITIN ) 10 mg Oral Tablet Take 1 Tablet (10 mg total) by mouth Once a day    nitroGLYCERIN  (NITROSTAT ) 0.4 mg Sublingual Tablet, Sublingual DISSOLVE ONE TABLET UNDER THE TONGUE EVERY 5 MINUTES AS NEEDED FOR CHEST PAIN.  DO NOT EXCEED A TOTAL OF 3 DOSES IN 15 MINUTES    pantoprazole  (PROTONIX ) 40 mg Oral Tablet, Delayed Release (E.C.) Take 1 Tablet (40 mg total) by mouth Daily    tiZANidine  (ZANAFLEX ) 4 mg Oral Tablet TAKE 1 TABLET BY MOUTH THREE TIMES DAILY      Allergies[2]     Review of Systems:  Positive findings addressed in HPI      PHYSICAL EXAM:   The patient appears to be in no acute distress.  Vitals: BP 120/72   Pulse 69   Temp 36.4 C (97.5 F)   Resp 18   Ht 1.702 m (5' 7)   Wt 88.5 kg (195 lb)   SpO2 94%   BMI 30.54 kg/m       HEENT:   Head Normocephalic. No masses, lesions, tenderness or abnormalities  Eyes: PERRLA, EOMI, conjunctiva wnl.    Ears: Bilateral TMs intact and clear.  Nose: Bilateral nares patent  Throat: Pharynx clear. No lymphadenopathy or thyromegaly.  RESPIRATORY: CTA decreased without wheezes or, no resp distress, equal BS  HEART: Regular rate and rhythm without ectopy or pauses, no murmur, no edema, pedal pulses 2+  GI:  BS present in 4 quadrants.  No tenderness, masses, organomegaly.  Abdomen soft.  Moderately obese without read  EXTREMITIES: no edema, erythema or tenderness  NEURO: AAOx3, CN's intact, DTR's 2/4 throughout, Romberg not tested    Data Review:  Pertinent laboratory data and imaging studies reviewed.      Assessment/Plan:    ICD-10-CM    1. Coronary artery disease, unspecified vessel or lesion type, unspecified whether angina present, unspecified whether native or transplanted heart  I25.10       2. History of heart artery stent  Z95.5       3. Mixed hyperlipidemia  E78.2       4. Nicotine dependence, cigarettes, uncomplicated  F17.210       5. Chronic obstructive pulmonary  disease, unspecified COPD type (CMS HCC)  J44.9       6. Gastroesophageal reflux disease, unspecified whether esophagitis present  K21.9 pantoprazole  (PROTONIX ) 40 mg Oral Tablet, Delayed Release (E.C.)      7.  Hypothyroidism, unspecified type  E03.9 THYROID  STIMULATING HORMONE (SENSITIVE TSH)      8. GAD (generalized anxiety disorder)  F41.1 clonazePAM  (KLONOPIN ) 1 mg Oral Tablet      9. Edema, unspecified type  R60.9       10. Essential hypertension  I10 lisinopriL  (PRINIVIL ) 10 mg Oral Tablet      11. Fatigue, unspecified type  R53.83 CBC/DIFF              Return in about 4 months (around 05/29/2024).      Patient overall seems to be doing about the same.  She has no significant pitting edema at this point takes the Lasix  on a PRN basis.  She takes her carvedilol  6.25 mg twice daily will continue that along with her lisinopril  she takes 10 mg daily.  Patient will continue with her Protonix  40 mg daily she has tizanidine  for any spasms.  We will continue the ibuprofen  600 mg 3 times daily for arthritis and continues her clonazepam  1 mg 3 times daily for her anxiety depression.  She has a albuterol  for breathing and continues to smoke about the same.  Her Synthroid 125 mcg stable she is order for CBC and a TSH.  Patient will continue with her regular medicines she will have her lab work done she will return to office in 4 months and was given refills on her prescriptions as needed.  Patient will return sooner if problems arise.    Patient advised on the adverse effects of tobacco.  Patient was instructed of the benefits of quitting.  Patient was provided the opportunity to be referred to tobacco dependence groups if patient would like and patient was also offered nicotine replacement products.  I have spent 5 minutes counseling the patient.    This is an established Patient. Pt has been seen in last 3 years.   Ozell FORBES Birmingham, MD       [1]   Social History  Tobacco Use    Smoking status: Every Day     Current  packs/day: 0.75     Average packs/day: 0.8 packs/day for 53.8 years (40.3 ttl pk-yrs)     Types: Cigarettes     Start date: 1972     Passive exposure: Current    Smokeless tobacco: Never   Vaping Use    Vaping status: Never Used   Substance Use Topics    Alcohol use: Yes     Comment: occasional    Drug use: Never   [2]   Allergies  Allergen Reactions    Sulfa (Sulfonamides) Hives/ Urticaria

## 2024-02-12 ENCOUNTER — Other Ambulatory Visit (RURAL_HEALTH_CENTER): Payer: Self-pay | Admitting: Family Medicine

## 2024-02-12 DIAGNOSIS — I509 Heart failure, unspecified: Secondary | ICD-10-CM

## 2024-02-14 ENCOUNTER — Other Ambulatory Visit (RURAL_HEALTH_CENTER): Payer: Self-pay | Admitting: Family Medicine

## 2024-02-14 DIAGNOSIS — I509 Heart failure, unspecified: Secondary | ICD-10-CM

## 2024-02-14 DIAGNOSIS — E782 Mixed hyperlipidemia: Secondary | ICD-10-CM

## 2024-02-14 DIAGNOSIS — I10 Essential (primary) hypertension: Secondary | ICD-10-CM

## 2024-02-16 ENCOUNTER — Ambulatory Visit (INDEPENDENT_AMBULATORY_CARE_PROVIDER_SITE_OTHER): Admitting: INTERVENTIONAL CARDIOLOGY

## 2024-02-18 ENCOUNTER — Other Ambulatory Visit (RURAL_HEALTH_CENTER): Payer: Self-pay | Admitting: Family Medicine

## 2024-02-18 DIAGNOSIS — M549 Dorsalgia, unspecified: Secondary | ICD-10-CM

## 2024-04-05 ENCOUNTER — Ambulatory Visit (INDEPENDENT_AMBULATORY_CARE_PROVIDER_SITE_OTHER): Admitting: INTERVENTIONAL CARDIOLOGY

## 2024-04-11 ENCOUNTER — Other Ambulatory Visit (RURAL_HEALTH_CENTER): Payer: Self-pay | Admitting: Family Medicine

## 2024-04-11 DIAGNOSIS — I509 Heart failure, unspecified: Secondary | ICD-10-CM

## 2024-04-13 ENCOUNTER — Other Ambulatory Visit (INDEPENDENT_AMBULATORY_CARE_PROVIDER_SITE_OTHER): Payer: Self-pay | Admitting: INTERVENTIONAL CARDIOLOGY

## 2024-04-13 ENCOUNTER — Other Ambulatory Visit (RURAL_HEALTH_CENTER): Payer: Self-pay | Admitting: Family Medicine

## 2024-04-13 DIAGNOSIS — M549 Dorsalgia, unspecified: Secondary | ICD-10-CM

## 2024-04-13 DIAGNOSIS — I2510 Atherosclerotic heart disease of native coronary artery without angina pectoris: Secondary | ICD-10-CM

## 2024-05-09 ENCOUNTER — Other Ambulatory Visit (RURAL_HEALTH_CENTER): Payer: Self-pay | Admitting: Family Medicine

## 2024-05-09 DIAGNOSIS — I509 Heart failure, unspecified: Secondary | ICD-10-CM

## 2024-05-14 ENCOUNTER — Other Ambulatory Visit (RURAL_HEALTH_CENTER): Payer: Self-pay | Admitting: Family Medicine

## 2024-05-14 DIAGNOSIS — I10 Essential (primary) hypertension: Secondary | ICD-10-CM

## 2024-05-14 DIAGNOSIS — I509 Heart failure, unspecified: Secondary | ICD-10-CM

## 2024-05-14 DIAGNOSIS — E782 Mixed hyperlipidemia: Secondary | ICD-10-CM

## 2024-05-17 ENCOUNTER — Ambulatory Visit (INDEPENDENT_AMBULATORY_CARE_PROVIDER_SITE_OTHER): Admitting: INTERVENTIONAL CARDIOLOGY

## 2024-05-18 ENCOUNTER — Ambulatory Visit

## 2024-05-18 ENCOUNTER — Other Ambulatory Visit: Payer: Self-pay

## 2024-05-18 DIAGNOSIS — E039 Hypothyroidism, unspecified: Secondary | ICD-10-CM

## 2024-05-18 DIAGNOSIS — R5383 Other fatigue: Secondary | ICD-10-CM

## 2024-05-18 LAB — CBC WITH DIFF
BASOPHIL #: <0.1 10*3/uL (ref <=0.20)
BASOPHIL %: 0.9 %
EOSINOPHIL #: 0.1 10*3/uL (ref <=0.50)
EOSINOPHIL %: 1.1 %
HCT: 39.3 % (ref 34.8–46.0)
HGB: 13.6 g/dL (ref 11.5–16.0)
IMMATURE GRANULOCYTE #: <0.1 10*3/uL (ref <0.10)
IMMATURE GRANULOCYTE %: 0.2 % (ref 0.0–1.0)
LYMPHOCYTE #: 2.21 10*3/uL (ref 1.00–4.80)
LYMPHOCYTE %: 25.2 %
MCH: 33.1 pg — ABNORMAL HIGH (ref 26.0–32.0)
MCHC: 34.6 g/dL (ref 31.0–35.5)
MCV: 95.6 fL (ref 78.0–100.0)
MONOCYTE #: 0.57 10*3/uL (ref 0.20–1.10)
MONOCYTE %: 6.5 %
MPV: 11.2 fL (ref 8.7–12.5)
NEUTROPHIL #: 5.8 10*3/uL (ref 1.50–7.70)
NEUTROPHIL %: 66.1 %
PLATELETS: 231 10*3/uL (ref 150–400)
RBC: 4.11 10*6/uL (ref 3.85–5.22)
RDW-CV: 13.2 % (ref 11.5–15.5)
WBC: 8.8 10*3/uL (ref 3.7–11.0)

## 2024-05-18 LAB — THYROID STIMULATING HORMONE (SENSITIVE TSH): TSH: 5.768 u[IU]/mL — ABNORMAL HIGH (ref 0.350–4.940)

## 2024-05-19 ENCOUNTER — Ambulatory Visit (RURAL_HEALTH_CENTER): Payer: Self-pay | Admitting: Family Medicine

## 2024-05-19 ENCOUNTER — Other Ambulatory Visit (RURAL_HEALTH_CENTER): Payer: Self-pay | Admitting: Family Medicine

## 2024-05-19 DIAGNOSIS — E039 Hypothyroidism, unspecified: Secondary | ICD-10-CM

## 2024-05-19 MED ORDER — LEVOTHYROXINE 137 MCG TABLET: 137.0000 ug | ORAL_TABLET | Freq: Every morning | ORAL | 4 refills | Status: AC

## 2024-05-19 NOTE — Result Encounter Note (Signed)
 Patient notified, takes med correctly.  New Rx routed

## 2024-05-19 NOTE — Result Encounter Note (Signed)
Voicemail message left for return call.

## 2024-05-30 ENCOUNTER — Ambulatory Visit (RURAL_HEALTH_CENTER): Payer: Self-pay | Admitting: Family Medicine

## 2024-06-03 ENCOUNTER — Ambulatory Visit (INDEPENDENT_AMBULATORY_CARE_PROVIDER_SITE_OTHER)

## 2024-06-03 ENCOUNTER — Ambulatory Visit (INDEPENDENT_AMBULATORY_CARE_PROVIDER_SITE_OTHER): Admitting: NUCLEAR MEDICINE

## 2024-06-03 ENCOUNTER — Ambulatory Visit

## 2024-06-07 ENCOUNTER — Ambulatory Visit (INDEPENDENT_AMBULATORY_CARE_PROVIDER_SITE_OTHER): Admitting: INTERVENTIONAL CARDIOLOGY

## 2024-09-28 ENCOUNTER — Ambulatory Visit (RURAL_HEALTH_CENTER): Payer: Self-pay | Admitting: Family Medicine

## 2024-10-26 ENCOUNTER — Ambulatory Visit (HOSPITAL_COMMUNITY): Payer: Self-pay
